# Patient Record
Sex: Female | Born: 1955
Health system: Southern US, Community
[De-identification: ages and names within clinical notes are randomized; demographics above are authoritative.]

## PROBLEM LIST (undated history)

## (undated) DIAGNOSIS — E871 Hypo-osmolality and hyponatremia: Secondary | ICD-10-CM

## (undated) DIAGNOSIS — E119 Type 2 diabetes mellitus without complications: Secondary | ICD-10-CM

## (undated) DIAGNOSIS — E782 Mixed hyperlipidemia: Secondary | ICD-10-CM

## (undated) DIAGNOSIS — I2510 Atherosclerotic heart disease of native coronary artery without angina pectoris: Secondary | ICD-10-CM

## (undated) DIAGNOSIS — I161 Hypertensive emergency: Secondary | ICD-10-CM

## (undated) DIAGNOSIS — T7840XA Allergy, unspecified, initial encounter: Secondary | ICD-10-CM

## (undated) DIAGNOSIS — I10 Essential (primary) hypertension: Secondary | ICD-10-CM

## (undated) DIAGNOSIS — I214 Non-ST elevation (NSTEMI) myocardial infarction: Secondary | ICD-10-CM

## (undated) DIAGNOSIS — Z720 Tobacco use: Secondary | ICD-10-CM

## (undated) DIAGNOSIS — E118 Type 2 diabetes mellitus with unspecified complications: Secondary | ICD-10-CM

## (undated) DIAGNOSIS — E1165 Type 2 diabetes mellitus with hyperglycemia: Secondary | ICD-10-CM

## (undated) DIAGNOSIS — F419 Anxiety disorder, unspecified: Secondary | ICD-10-CM

## (undated) DIAGNOSIS — F32A Depression, unspecified: Secondary | ICD-10-CM

## (undated) DIAGNOSIS — E876 Hypokalemia: Secondary | ICD-10-CM

## (undated) HISTORY — PX: SPINE SURGERY: SHX786

## (undated) HISTORY — DX: Mixed hyperlipidemia: E78.2

## (undated) HISTORY — DX: Essential (primary) hypertension: I10

## (undated) HISTORY — DX: Type 2 diabetes mellitus without complications: E11.9

## (undated) HISTORY — DX: Atherosclerotic heart disease of native coronary artery without angina pectoris: I25.10

## (undated) HISTORY — DX: Type 2 diabetes mellitus with hyperglycemia: E11.65

## (undated) HISTORY — DX: Non-ST elevation (NSTEMI) myocardial infarction: I21.4

## (undated) HISTORY — DX: Allergy, unspecified, initial encounter: T78.40XA

## (undated) HISTORY — DX: Hypertensive emergency: I16.1

## (undated) HISTORY — DX: Hypo-osmolality and hyponatremia: E87.1

## (undated) HISTORY — DX: Hypokalemia: E87.6

## (undated) HISTORY — DX: Type 2 diabetes mellitus with unspecified complications: E11.8

## (undated) HISTORY — DX: Depression, unspecified: F32.A

## (undated) HISTORY — PX: TONSILLECTOMY: SUR1361

## (undated) HISTORY — PX: FRACTURE SURGERY: SHX138

## (undated) HISTORY — DX: Tobacco use: Z72.0

## (undated) HISTORY — DX: Anxiety disorder, unspecified: F41.9

---

## 2000-12-28 ENCOUNTER — Emergency Department (HOSPITAL_COMMUNITY): Admission: EM | Admit: 2000-12-28 | Discharge: 2000-12-28 | Payer: Self-pay | Admitting: Emergency Medicine

## 2010-09-07 ENCOUNTER — Inpatient Hospital Stay (HOSPITAL_COMMUNITY)
Admission: EM | Admit: 2010-09-07 | Discharge: 2010-09-07 | DRG: 305 | Disposition: A | Discharge status: Home / Self Care | Payer: Self-pay | Attending: Family Medicine | Admitting: Family Medicine

## 2010-09-07 ENCOUNTER — Emergency Department (HOSPITAL_COMMUNITY): Payer: Self-pay

## 2010-09-07 DIAGNOSIS — R112 Nausea with vomiting, unspecified: Secondary | ICD-10-CM | POA: Diagnosis present

## 2010-09-07 DIAGNOSIS — Z79899 Other long term (current) drug therapy: Secondary | ICD-10-CM

## 2010-09-07 DIAGNOSIS — R0789 Other chest pain: Secondary | ICD-10-CM

## 2010-09-07 DIAGNOSIS — Z7982 Long term (current) use of aspirin: Secondary | ICD-10-CM

## 2010-09-07 DIAGNOSIS — E785 Hyperlipidemia, unspecified: Secondary | ICD-10-CM | POA: Diagnosis present

## 2010-09-07 DIAGNOSIS — F17200 Nicotine dependence, unspecified, uncomplicated: Secondary | ICD-10-CM | POA: Diagnosis present

## 2010-09-07 DIAGNOSIS — I10 Essential (primary) hypertension: Principal | ICD-10-CM | POA: Diagnosis present

## 2010-09-07 DIAGNOSIS — E119 Type 2 diabetes mellitus without complications: Secondary | ICD-10-CM | POA: Diagnosis present

## 2010-09-07 DIAGNOSIS — R110 Nausea: Secondary | ICD-10-CM

## 2010-09-07 DIAGNOSIS — E118 Type 2 diabetes mellitus with unspecified complications: Secondary | ICD-10-CM

## 2010-09-07 LAB — BASIC METABOLIC PANEL
CO2: 24 mEq/L (ref 19–32)
Calcium: 9.5 mg/dL (ref 8.4–10.5)
Chloride: 93 mEq/L — ABNORMAL LOW (ref 96–112)
Creatinine, Ser: 0.55 mg/dL (ref 0.4–1.2)
Glucose, Bld: 267 mg/dL — ABNORMAL HIGH (ref 70–99)

## 2010-09-07 LAB — CBC
Hemoglobin: 13.7 g/dL (ref 12.0–15.0)
Platelets: 216 10*3/uL (ref 150–400)
Platelets: 204 10*3/uL (ref 150–400)
RBC: 4.5 MIL/uL (ref 3.87–5.11)
WBC: 10.4 10*3/uL (ref 4.0–10.5)

## 2010-09-07 LAB — COMPREHENSIVE METABOLIC PANEL
ALT: 19 U/L (ref 0–35)
AST: 16 U/L (ref 0–37)
Albumin: 3.8 g/dL (ref 3.5–5.2)
CO2: 24 mEq/L (ref 19–32)
Chloride: 95 mEq/L — ABNORMAL LOW (ref 96–112)
Creatinine, Ser: 0.52 mg/dL (ref 0.4–1.2)
GFR calc Af Amer: >60 mL/min (ref >60)
GFR calc non Af Amer: >60 mL/min (ref >60)
Potassium: 3.8 mEq/L (ref 3.5–5.1)
Sodium: 133 mEq/L — ABNORMAL LOW (ref 135–145)
Total Bilirubin: 0.5 mg/dL (ref 0.3–1.2)

## 2010-09-07 LAB — URINE MICROSCOPIC-ADD ON

## 2010-09-07 LAB — LIPID PANEL
Cholesterol: 384 mg/dL — ABNORMAL HIGH (ref 0–200)
LDL Cholesterol: UNDETERMINED mg/dL (ref 0–99)
VLDL: UNDETERMINED mg/dL (ref 0–40)

## 2010-09-07 LAB — URINALYSIS, ROUTINE W REFLEX MICROSCOPIC
Glucose, UA: 100 mg/dL — AB
Hgb urine dipstick: NEGATIVE
Protein, ur: 100 mg/dL — AB
Specific Gravity, Urine: 1.009 (ref 1.005–1.030)
Urobilinogen, UA: 0.2 mg/dL (ref 0.0–1.0)

## 2010-09-07 LAB — DIFFERENTIAL
Basophils Absolute: 0.1 10*3/uL (ref 0.0–0.1)
Basophils Absolute: 0 10*3/uL (ref 0.0–0.1)
Basophils Relative: 1 % (ref 0–1)
Eosinophils Absolute: 0.2 10*3/uL (ref 0.0–0.7)
Eosinophils Absolute: 0.2 10*3/uL (ref 0.0–0.7)
Lymphocytes Relative: 6 % — ABNORMAL LOW (ref 12–46)
Lymphs Abs: 1 10*3/uL (ref 0.7–4.0)
Monocytes Relative: 6 % (ref 3–12)
Monocytes Relative: 5 % (ref 3–12)
Neutro Abs: 8.3 10*3/uL — ABNORMAL HIGH (ref 1.7–7.7)
Neutro Abs: 9.1 10*3/uL — ABNORMAL HIGH (ref 1.7–7.7)
Neutrophils Relative %: 87 % — ABNORMAL HIGH (ref 43–77)

## 2010-09-07 LAB — TSH: TSH: 0.643 u[IU]/mL (ref 0.350–4.500)

## 2010-09-07 LAB — POCT CARDIAC MARKERS
CKMB, poc: <1 ng/mL — ABNORMAL LOW (ref 1.0–8.0)
Myoglobin, poc: 58.4 ng/mL (ref 12–200)
Troponin i, poc: <0.05 ng/mL (ref 0.00–0.09)

## 2010-09-07 LAB — HEMOGLOBIN A1C
Hgb A1c MFr Bld: 9.2 % — ABNORMAL HIGH (ref <5.7)
Mean Plasma Glucose: 217 mg/dL — ABNORMAL HIGH (ref <117)

## 2010-10-01 NOTE — Discharge Summary (Signed)
NAME:  Rebecca [name], Rebecca [name]                ACCOUNT NO.:  [id number]  MEDICAL RECORD NO.:  [id number]           PATIENT TYPE:  I  LOCATION:  3733                         FACILITY:  [facility]  PHYSICIAN:  Pearlean Brownie, M.D.DATE OF BIRTH:  02-Oct-1955  DATE OF ADMISSION:  09/07/2010 DATE OF DISCHARGE:  09/07/2010                              DISCHARGE SUMMARY   PRIMARY CARE PHYSICIAN:  [facility].  DISCHARGE DIAGNOSES: 1. Hypertension. 2. Diabetes. 3. Hyperlipidemia. 4. Nausea.  DISCHARGE MEDICATIONS: 1. Aspirin 81 mg p.o. daily. 2. Glucotrol 5 mg p.o. daily. 3. Hydrochlorothiazide 25 mg p.o. daily. 4. Lisinopril 20 mg p.o. daily. 5. Metformin 1000 mg p.o. b.i.d. 6. Zofran 4 mg p.o. q.6 h. p.r.n. 7. Simvastatin 20 mg p.o. daily. 8. Fish oil one capsule p.o. daily.  BRIEF HOSPITAL SUMMARY:  The patient is a 55 year old female with a past medical history of hypertension, diabetes, hyperlipidemia who came in to the emergency department after 1 day of generalized malaise, nausea, vomiting, found to have an elevated blood pressure systolics into 200s and blood sugars into the 400s with no gap.  The patient has been seen previously at [facility] and diagnosed with hypertension and diabetes, but has not followed up with them since initial diagnosis. 1. Diabetes.  Blood sugar into the 300s.  The patient was given     sliding scale insulin to help bring blood sugar down.  There was no     anion gap.  BMET within normal limits exact from glucose elevation.     The patient was previously on metformin 500 b.i.d.  The patient has     not had follow up for her diabetes in the past year.  We went ahead     and increased metformin to 1000 mg p.o. b.i.d.  Also, added     Glucotrol 5 mg p.o. daily.  A1c is pending at time of discharge,     but was drawn during hospitalization.  The patient is to follow up     with [facility] tomorrow to recheck blood sugar and to     ensure  that it is coming down with p.o. agents.  The patient may     need insulin for blood sugar control, but we will first give trial     of p.o. at bedtime.  [facility] to follow. 2. Hypertension.  On admission, the patient had no signs of organ     dysfunction.  The patient states she was taking her     lisinopril/hydrochlorothiazide as directed, lisinopril 10 mg and     hydrochlorothiazide 12.5, increased the patient's blood pressure     medications to lisinopril 20 and hydrochlorothiazide 25 mg, some     improvement with blood pressure, will need to be followed at     outpatient to ensure the blood pressure continues to trend down.     We will most likely need additional agent.  The patient to follow     up tomorrow with [facility] for blood pressure recheck and     further titration or addition of  other agents as needed. 3. Chest pain.  The patient did have chest pain on presentation to the     emergency department, it was atypical in nature.  However, cardiac     enzymes are negative x2.  A1c is pending by suspect that her blood     sugar has not been in good control causing her to be at increased     risk for cardiac event.  Fasting lipid panel did show elevations in     cholesterol and triglycerides.  TSH is pending.  EKG did not show     any ST changes, but did show the patient does have a right bundle     branch block.  Did not have an EKG for comparison.  I do not think     that the patient has had a cardiac event, but does have risk     factors that need to be better controlled in order to prevent     future cardiac event.  The patient does have a positive systolic     murmur and PCP may want to consider doing an echo as an outpatient     for further evaluation. 4. Hyperlipidemia.  Lipid profile showed cholesterol 384,     triglycerides 2585.  The patient placed on statin at time of     discharge.  The patient to be followed up by PCP to ensure that     lipid  profile is improving.  We will not start fibroids at this     time.  We will defer to PCP for possible addition of this when the     patient is an outpatient setting. 5. Nausea, unsure of etiology of nausea onset x1-2 days.  No fever, no     body aches, no bloody stools, no blood in vomit, no vomiting since     time of admission.  Nausea controlled well with Zofran.  We will     discharge the patient with Zofran.  The patient to follow up     tomorrow with PCP to ensure that nausea has resolved.  PERTINENT LABORATORY VALUES:  TSH 0.643.  Lipid profile; total cholesterol 384, triglycerides 2585.  Other values unable to calculate secondary to high triglyceride level.  Cardiac markers point-of-care negative x2.  CBC; white blood cells count 10.4, hemoglobin 13.7, platelets 204.  CMP; sodium 133, potassium 3.8, chloride 95, bicarb 24, glucose 318, BUN 9, creatinine 0.52, lipase 72.  DISCHARGE FOLLOWUP:  The patient is to follow up with [facility] tomorrow at the [facility] to follow up on the above issues.  CONDITION ON DISCHARGE:  Stable.  The patient discharged on carb- modified, diabetic diet.     [name], MD   ______________________________ Pearlean Brownie, M.D.    DC/MEDQ  D:  09/07/2010  T:  09/08/2010  Job:  [id number]  Electronically Signed by [name]  on 09/23/2010 08:34:31 AM Electronically Signed by Pearlean Brownie M.D. on 10/01/2010 01:36:29 PM

## 2010-10-01 NOTE — H&P (Signed)
NAME:  Rebecca [name], WITHEM                ACCOUNT NO.:  [id number]  MEDICAL RECORD NO.:  [id number]           PATIENT TYPE:  I  LOCATION:  3733                         FACILITY:  [facility]  PHYSICIAN:  Pearlean Brownie, M.D.DATE OF BIRTH:  March 31, 1956  DATE OF ADMISSION:  09/07/2010 DATE OF DISCHARGE:  09/07/2010                             HISTORY & PHYSICAL   PRIMARY CARE PROVIDER:  [facility]/unassigned.  CHIEF COMPLAINT:  Hypertensive urgency.  HISTORY OF PRESENT ILLNESS:  This is a 55 year old female with past medical history of poorly-controlled hypertension, diabetes, hyperlipidemia here with a 1-day history of generalized malaise, nausea, vomiting, as well as intermittent chest pain, found to have elevated BP, systolic blood pressure into the 200s home as well as blood sugars into the 400s.  The patient states that she was previously seen at [facility] approximately 1-1/2 years ago for blood pressure, diabetes, and hyperlipidemia management.  The patient states that she has never had to follow up since that time apart from intermittent nonspecific complaints at [facility] up until August 2011 and has not been seen since that point.  The patient states she has been relatively asymptomatic prior to yesterday with no chest pain, shortness of breath, or generalized malaise.  BPs have been relatively well-controlled per patient at home; however, the patient reports that she does not check a blood pressure on a regular basis. The patient reports being compliant with her blood pressure medications, which include lisinopril 10 mg and hydrochlorothiazide 12.5 mg daily. The patient does report increased sodium intake, eating a lot of popcorns on a regular basis.  CBGs have also been relatively stable with metformin, which is currently at 500 mg b.i.d. dosing per the patient. The patient states that she knows yesterday her blood pressures were well into the 190s  despite lisinopril and hydrochlorothiazide use as well as blood sugars into the 400s at home despite metformin.  The patient also reports nonspecific intermittent chest pain.  The patient states this chest pain was without radiation.  No diaphoresis.  No alleviating or aggravating factors.  The patient states she does not have a history of MI, but the patient does state that she does take a baby aspirin daily.  The patient also reports intermittent polyuria and polydipsia.  ALLERGIES:  PENICILLIN.  PAST MEDICAL HISTORY: 1. Diabetes. 2. Hypertension. 3. Hyperlipidemia.  MEDICATIONS: 1. Lisinopril 10 mg p.o. daily. 2. Hydrochlorothiazide 12.5 mg p.o. daily. 3. Metformin 500 mg p.o. b.i.d. 4. Over-the-counter fish oil. 5. Aspirin 81 mg.  PAST SURGICAL HISTORY:  The patient with elbow surgery in third grade.  SOCIAL HISTORY:  The patient currently lives with significant other. 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  Smokes one pack per day.  Denies alcohol, tobacco, or drug use.  FAMILY HISTORY:  Positive for hypertension, diabetes in many members maternal and paternal family per the patient.  REVIEW OF SYSTEMS:  Negative except as noted above in HPI.  PHYSICAL EXAMINATION:  VITAL SIGNS:  Temperature 98.6, heart rate in the 70s, respirations 16 to 20, blood pressure is 182 to 209 over 70-97, saturating 95% on room air.  GENERAL:  The patient is in bed in no acute distress. HEENT:  Normocephalic, atraumatic.  Extraocular movements intact. NECK:  Large neck as stated above.  Full range of motion.  Supple. CARDIOVASCULAR:  Positive 1-2/6 systolic murmur.  Regular rate and rhythm. LUNGS:  Clear to auscultation bilaterally.  No wheezes, rales, or rhonchi. ABDOMEN:  Obese, nontender.  Positive bowel sounds. EXTREMITIES:  2+ peripheral pulses.  No edema noted. NEURO:  Alert and oriented x3.  Cranial nerves II through XII grossly intact.  Overall normal neurological function. MUSCULOSKELETAL:  5/5  strength in the right upper and lower extremity diffusely, 4/5 strength in the left upper and lower extremities diffusely (the patient states that this is her baseline).  LABS AND STUDIES: 1. BMET; sodium 131, potassium 4.7, chloride 93, CO2 of 24, BUN 9,     creatinine 0.55, glucose 267. 2. CBC; white count 10.4, hemoglobin 13.7, hematocrit 39.4 (corrected     for lipemia). 3. Calcium 9.5. 4. Lipase 72. 5. Point-of-care cardiac enzymes negative x2. 6. Chest x-ray show no acute cardiopulmonary disease. 7. EKG showing normal sinus rhythm and right bundle-branch block.  ASSESSMENT/PLAN:  This is a 55 year old female with past medical history of hypertension, hyperlipidemia, and diabetes who presents with questionable hypertensive urgency. 1. Hypertension.  This is likely secondary to poorly-controlled     hypertension.  No signs of end-organ dysfunction on labs or     physical exam.  We will start the patient on double dose of     lisinopril/hydrochlorothiazide and hydralazine for recalcitrant PTs     180s.  The patient may benefit from addition of beta-blocker at     some point.  We will check BMET in the a.m. 2. Chest pain, atypical in nature, however, given cardiovascular risk     factors, we will check cardiac enzymes as well as check a TSH,     fasting lipid panel, A1c for risk factor stratification.  We will     also check a 2-D echo given positive murmur on exam. 3. Diabetes.  We will check A1c.  I have also increased metformin to     1000 mg p.o. b.i.d.  No anion gap on presentation.  No current     signs or symptoms of DKA.  We will continue to follow. 4. Hyperlipidemia.  The patient reports history of statin     noncompliance with intermittent fish oil use.  The patient is noted     to have a marked lipemia interfering with hemoglobin reads.  We     will check a fasting lipid panel. 5. Fluid, electrolytes, and nutrition/gastrointestinal.  We will     repeat electrolytes  p.r.n., normal saline 125 an hour,     diabetic heart-healthy diet. 6. Prophylaxis.  Heparin and Protonix. 7. Disposition pending for evaluation.     Doree Albee, MD   ______________________________ Pearlean Brownie, M.D.    SN/MEDQ  D:  09/07/2010  T:  09/08/2010  Job:  [id number]  Electronically Signed by Doree Albee  on 09/19/2010 07:26:40 PM Electronically Signed by Pearlean Brownie M.D. on 10/01/2010 01:36:13 PM

## 2010-12-01 ENCOUNTER — Other Ambulatory Visit: Payer: Self-pay | Admitting: Family Medicine

## 2011-01-27 ENCOUNTER — Other Ambulatory Visit: Payer: Self-pay | Admitting: Family Medicine

## 2011-03-09 ENCOUNTER — Other Ambulatory Visit: Payer: Self-pay | Admitting: Family Medicine

## 2011-03-13 ENCOUNTER — Other Ambulatory Visit: Payer: Self-pay | Admitting: Family Medicine

## 2012-04-06 ENCOUNTER — Encounter (HOSPITAL_COMMUNITY): Payer: Self-pay | Admitting: Emergency Medicine

## 2012-04-06 ENCOUNTER — Emergency Department (HOSPITAL_COMMUNITY): Payer: Self-pay

## 2012-04-06 ENCOUNTER — Ambulatory Visit (INDEPENDENT_AMBULATORY_CARE_PROVIDER_SITE_OTHER): Payer: Self-pay | Admitting: Emergency Medicine

## 2012-04-06 ENCOUNTER — Observation Stay (HOSPITAL_COMMUNITY)
Admission: EM | Admit: 2012-04-06 | Discharge: 2012-04-08 | Disposition: A | Discharge status: Home / Self Care | Payer: Self-pay | Attending: Emergency Medicine | Admitting: Emergency Medicine

## 2012-04-06 VITALS — BP 180/100 | HR 79 | Temp 98.1°F | Resp 16 | Ht 64.0 in | Wt 166.0 lb

## 2012-04-06 DIAGNOSIS — R079 Chest pain, unspecified: Principal | ICD-10-CM | POA: Insufficient documentation

## 2012-04-06 DIAGNOSIS — Z720 Tobacco use: Secondary | ICD-10-CM | POA: Diagnosis present

## 2012-04-06 DIAGNOSIS — E118 Type 2 diabetes mellitus with unspecified complications: Secondary | ICD-10-CM | POA: Diagnosis present

## 2012-04-06 DIAGNOSIS — F17200 Nicotine dependence, unspecified, uncomplicated: Secondary | ICD-10-CM | POA: Insufficient documentation

## 2012-04-06 DIAGNOSIS — E1165 Type 2 diabetes mellitus with hyperglycemia: Secondary | ICD-10-CM

## 2012-04-06 DIAGNOSIS — E782 Mixed hyperlipidemia: Secondary | ICD-10-CM | POA: Diagnosis present

## 2012-04-06 DIAGNOSIS — E871 Hypo-osmolality and hyponatremia: Secondary | ICD-10-CM | POA: Insufficient documentation

## 2012-04-06 DIAGNOSIS — R739 Hyperglycemia, unspecified: Secondary | ICD-10-CM

## 2012-04-06 DIAGNOSIS — E119 Type 2 diabetes mellitus without complications: Secondary | ICD-10-CM | POA: Insufficient documentation

## 2012-04-06 DIAGNOSIS — I10 Essential (primary) hypertension: Secondary | ICD-10-CM | POA: Diagnosis present

## 2012-04-06 DIAGNOSIS — R0602 Shortness of breath: Secondary | ICD-10-CM | POA: Insufficient documentation

## 2012-04-06 DIAGNOSIS — IMO0002 Reserved for concepts with insufficient information to code with codable children: Secondary | ICD-10-CM

## 2012-04-06 HISTORY — DX: Tobacco use: Z72.0

## 2012-04-06 HISTORY — DX: Type 2 diabetes mellitus with hyperglycemia: E11.65

## 2012-04-06 HISTORY — DX: Reserved for concepts with insufficient information to code with codable children: IMO0002

## 2012-04-06 HISTORY — DX: Mixed hyperlipidemia: E78.2

## 2012-04-06 LAB — CBC
HCT: 37.5 % (ref 36.0–46.0)
Hemoglobin: 13 g/dL (ref 12.0–15.0)
MCH: 30.1 pg (ref 26.0–34.0)
MCHC: 34.7 g/dL (ref 30.0–36.0)
MCV: 86.8 fL (ref 78.0–100.0)
Platelets: 165 10*3/uL (ref 150–400)
RBC: 4.32 MIL/uL (ref 3.87–5.11)
RDW: 15.4 % (ref 11.5–15.5)
WBC: 8.6 10*3/uL (ref 4.0–10.5)

## 2012-04-06 LAB — BASIC METABOLIC PANEL
BUN: 6 mg/dL (ref 6–23)
CO2: 27 mEq/L (ref 19–32)
Calcium: 8.7 mg/dL (ref 8.4–10.5)
Chloride: 89 mEq/L — ABNORMAL LOW (ref 96–112)
Creatinine, Ser: 0.6 mg/dL (ref 0.50–1.10)
GFR calc Af Amer: >90 mL/min (ref >90)
GFR calc non Af Amer: >90 mL/min (ref >90)
Glucose, Bld: 245 mg/dL — ABNORMAL HIGH (ref 70–99)
Potassium: 3.7 mEq/L (ref 3.5–5.1)
Sodium: 125 mEq/L — ABNORMAL LOW (ref 135–145)

## 2012-04-06 LAB — POCT I-STAT TROPONIN I
Troponin i, poc: 0 ng/mL (ref 0.00–0.08)
Troponin i, poc: 0 ng/mL (ref 0.00–0.08)

## 2012-04-06 LAB — TROPONIN I: Troponin I: <0.3 ng/mL (ref <0.30)

## 2012-04-06 MED ORDER — SODIUM CHLORIDE 0.9 % IV BOLUS (SEPSIS)
1000.0000 mL | Freq: Once | INTRAVENOUS | Status: AC
  Administered 2012-04-06: 1000 mL via INTRAVENOUS

## 2012-04-06 MED ORDER — GLIPIZIDE 5 MG PO TABS
5.0000 mg | ORAL_TABLET | Freq: Two times a day (BID) | ORAL | Status: DC
  Filled 2012-04-06 (×4): qty 1

## 2012-04-06 MED ORDER — NITROGLYCERIN 2 % TD OINT
1.0000 [in_us] | TOPICAL_OINTMENT | Freq: Once | TRANSDERMAL | Status: AC
  Administered 2012-04-06: 1 [in_us] via TOPICAL
  Filled 2012-04-06: qty 1

## 2012-04-06 MED ORDER — METOPROLOL TARTRATE 25 MG PO TABS
50.0000 mg | ORAL_TABLET | Freq: Once | ORAL | Status: AC
  Administered 2012-04-06: 50 mg via ORAL
  Filled 2012-04-06: qty 2

## 2012-04-06 MED ORDER — SODIUM CHLORIDE 0.9 % IV BOLUS (SEPSIS)
500.0000 mL | Freq: Once | INTRAVENOUS | Status: AC
  Administered 2012-04-06: 500 mL via INTRAVENOUS

## 2012-04-06 MED ORDER — GI COCKTAIL ~~LOC~~
30.0000 mL | Freq: Once | ORAL | Status: AC
  Administered 2012-04-06: 30 mL via ORAL
  Filled 2012-04-06: qty 30

## 2012-04-06 MED ORDER — METFORMIN HCL 500 MG PO TABS
500.0000 mg | ORAL_TABLET | Freq: Two times a day (BID) | ORAL | Status: DC
  Filled 2012-04-06: qty 1

## 2012-04-06 MED ORDER — LISINOPRIL 10 MG PO TABS
10.0000 mg | ORAL_TABLET | Freq: Every day | ORAL | Status: DC
  Administered 2012-04-07 (×2): 10 mg via ORAL
  Filled 2012-04-06 (×3): qty 1

## 2012-04-06 MED ORDER — SIMVASTATIN 20 MG PO TABS
20.0000 mg | ORAL_TABLET | Freq: Every day | ORAL | Status: DC
  Administered 2012-04-06 – 2012-04-07 (×2): 20 mg via ORAL
  Filled 2012-04-06 (×4): qty 1

## 2012-04-06 MED ORDER — NITROGLYCERIN 0.3 MG SL SUBL
0.4000 mg | SUBLINGUAL_TABLET | Freq: Once | SUBLINGUAL | Status: AC
  Administered 2012-04-06: 0.4 mg via SUBLINGUAL

## 2012-04-06 NOTE — ED Notes (Signed)
[facility] called EMS to transport patient to [facility] hospital.  Patient responded to [facility] due to chest pain since Tuesday.  Patient advises it is a burning sensation however she refused nitro with EMS.  She did receive 4 baby aspirins with EMS and prior to their arrival urgent care had given her one nitro.  Patient transported here with no incidents.

## 2012-04-06 NOTE — Progress Notes (Signed)
[facility] [address] [place] [address] [phone]  Date:  04/06/2012   Name:  Rebecca [name]   DOB:  June 23, 1955   MRN:  [id number]  PCP:  No primary provider on file.    Chief Complaint: Hypertension and Chest Pain   History of Present Illness:  Rebecca [name] is a 56 y.o. very pleasant female patient who presents with the following:  Patient with type 2 diabetes and hypertension and history of 1 ppd smoking history. Has experienced chest pain that is burning in character and waxes and wanes over the last week but is never gone.  Her blood pressure has been out of control all day with a max BP 194/108.  BP has moderated a bit by the time of her visit.  Has experienced pain radiating into her teeth.  Denies shortness of breath, nausea, or diaphoresis.  All week has sensation of extrasystoles and tachyarythmia.  Denies any relationship of her pain and activity.    There is no problem list on file for this patient.   Past Medical History  Diagnosis Date  . Hypertension   . Diabetes mellitus without complication     History reviewed. No pertinent past surgical history.  History  Substance Use Topics  . Smoking status: Current Every Day Smoker -- 1.0 packs/day    Types: Cigarettes  . Smokeless tobacco: Not on file  . Alcohol Use: Not on file    Family History  Problem Relation Age of Onset  . Diabetes Brother   . Heart disease Brother   . Arthritis Brother     Allergies  Allergen Reactions  . Penicillins Rash    Medication list has been reviewed and updated.  Current Outpatient Prescriptions on File Prior to Visit  Medication Sig Dispense Refill  . glipiZIDE (GLUCOTROL) 5 MG tablet Take 5 mg by mouth 2 (two) times daily before a meal.      . hydrochlorothiazide (MICROZIDE) 12.5 MG capsule Take 12.5 mg by mouth daily.      . metFORMIN (GLUCOPHAGE) 500 MG tablet Take 500 mg by mouth 2 (two) times daily with a meal.      . simvastatin  (ZOCOR) 10 MG tablet Take 20 mg by mouth at bedtime.        Review of Systems:  As per HPI, otherwise negative.    Physical Examination: Filed Vitals:   04/06/12 1457  BP: 180/100  Pulse:   Temp:   Resp:    Filed Vitals:   04/06/12 1455  Height: 5\' 4"  (1.626 m)  Weight: 166 lb (75.297 kg)   Body mass index is 28.49 kg/(m^2). Ideal Body Weight: Weight in (lb) to have BMI = 25: 145.3   GEN: obese, NAD, Non-toxic, A & O x 3 HEENT: Atraumatic, Normocephalic. Neck supple. No masses, No LAD.  No rash or shortness of breath Ears and Nose: No external deformity. CV: RRR, No M/G/R. No JVD. No thrill. No extra heart sounds. PULM: CTA B, no wheezes, crackles, rhonchi. No retractions. No resp. distress. No accessory muscle use. ABD: S, NT, ND, +BS. No rebound. No HSM. EXTR: No c/c/e NEURO Normal gait.  PSYCH: Normally interactive. Conversant. Not depressed or anxious appearing.  Calm demeanor.    Assessment and Plan: Chest pain Uncontrolled hypertension NIDDM  Tobacco abuse TO ER  IV Currently taking ASA NTG O2  Carmelina Dane, MD

## 2012-04-06 NOTE — ED Notes (Signed)
Family at bedside. 

## 2012-04-06 NOTE — ED Provider Notes (Signed)
History    56yF with CP. Onset Tuesday. Substernal burning. Initially waxed/waned but constant since woke up this am. No SOB. No palpitations, nausea or diaphoresis. Pain does not radiate. No fever or chills. No unusual leg pain or swelling. Denies hx of blood clot. Smoker, diabetic and has HTN. No known CAD that she is aware of. Reports cath 5-6y ago and no significant blockages that she is aware of. CSN: [id number]  Arrival date & time 04/06/12  1630   First MD Initiated Contact with Patient 04/06/12 1639      Chief Complaint  Patient presents with  . Chest Pain    (Consider location/radiation/quality/duration/timing/severity/associated sxs/prior treatment) HPI  Past Medical History  Diagnosis Date  . Hypertension   . Diabetes mellitus without complication     No past surgical history on file.  Family History  Problem Relation Age of Onset  . Diabetes Brother   . Heart disease Brother   . Arthritis Brother     History  Substance Use Topics  . Smoking status: Current Every Day Smoker -- 1.0 packs/day    Types: Cigarettes  . Smokeless tobacco: Not on file  . Alcohol Use: Not on file    OB History    Grav Para Term Preterm Abortions TAB SAB Ect Mult Living                  Review of Systems   Review of symptoms negative unless otherwise noted in HPI.   Allergies  Penicillins  Home Medications   Current Outpatient Rx  Name  Route  Sig  Dispense  Refill  . BUCALFASPKGLUCCOUCHPARSUVAURJU PO TABS   Oral   Take 1 tablet by mouth daily.         Marland Kitchen GLIPIZIDE 5 MG PO TABS   Oral   Take 5 mg by mouth 2 (two) times daily before a meal.         . KRILL OIL 1000 MG PO CAPS   Oral   Take 1 capsule by mouth daily.         Marland Kitchen LISINOPRIL 10 MG PO TABS   Oral   Take 10 mg by mouth daily.         Marland Kitchen METFORMIN HCL 500 MG PO TABS   Oral   Take 500 mg by mouth 2 (two) times daily with a meal.         . SIMVASTATIN 10 MG PO TABS   Oral   Take 20 mg by  mouth at bedtime.           BP 190/78  Temp 99 F (37.2 C) (Oral)  Resp 20  SpO2 98%  Physical Exam  Nursing note and vitals reviewed. Constitutional: She appears well-developed and well-nourished. No distress.  HENT:  Head: Normocephalic and atraumatic.  Eyes: Conjunctivae normal are normal. Right eye exhibits no discharge. Left eye exhibits no discharge.  Neck: Neck supple.  Cardiovascular: Normal rate, regular rhythm and normal heart sounds.  Exam reveals no gallop and no friction rub.   No murmur heard. Pulmonary/Chest: Effort normal and breath sounds normal. No respiratory distress. She exhibits no tenderness.  Abdominal: Soft. She exhibits no distension. There is no tenderness.  Musculoskeletal: She exhibits no edema and no tenderness.       Lower extremities symmetric as compared to each other. No calf tenderness. Negative Homan's. No palpable cords.   Neurological: She is alert.  Skin: Skin is warm and dry. She is not diaphoretic.  Psychiatric: She has a normal mood and affect. Her behavior is normal. Thought content normal.    ED Course  Procedures (including critical care time)  Labs Reviewed  BASIC METABOLIC PANEL - Abnormal; Notable for the following:    Sodium 125 (*)     Chloride 89 (*)     Glucose, Bld 245 (*)     All other components within normal limits  CBC  TROPONIN I   Dg Chest 2 View  04/06/2012  *RADIOLOGY REPORT*  Clinical Data: Chest pain.  Smoker.  Hypertension.  Diabetes.  CHEST - 2 VIEW  Comparison: 09/07/2010.  Findings: Stable enlarged cardiac silhouette and prominent pulmonary vasculature and interstitial markings.  Unremarkable bones.  IMPRESSION: No acute abnormality.  Stable cardiomegaly, pulmonary vascular congestion and chronic interstitial lung disease.   Original Report Authenticated By: Beckie Salts, M.D.    EKG:  Rhythm: normal sinus Vent. rate 75 BPM PR interval 176 ms QRS duration 144 ms QT/QTc 428/478 ms RBBB ST segments:  Nonspecific ST changes. There T waves inversions anteriorly. Impression: T wave inversions noted on prior EKG from May 2012  1. Chest pain   2. Hyponatremia       MDM  56 year old female with chest pain. Atypical for ACS given burning quality and extended duration. May be GI. Doubt PE. Doubt infectious. Doubt dissection. Patient does have multiple risk factors for CAD including hypertension, smoking, and diabetes. EKG is stable from prior. Initial troponin is normal. Will place patient in the CDU under the chest pain protocol. Her BMI is under 35 and she is to get a CT of her coronaries in a.m. after she has been ruled out.        Raeford Razor, MD 04/11/12 1426

## 2012-04-06 NOTE — ED Provider Notes (Signed)
Patient in CDU under chest pain protocol.  Chest pain currently improved, however patient appears uncomfortable in bed.  She states her back is sore and she is tired of laying in bed, but when she sits up her blood pressure rises.  Patient conscious, alert, responsive.  Lungs CTA bilaterally.  S1/S2, RRR.  Abdomen soft, bowel sounds present.  Monitor reveals NSR without ectopy.  Strong distal pulses palpated all extremities.  Patient is diabetic with oral control, has been out of glipizide for several months.  She does continue to take her metformin.  Blood sugar today of 245. Cardiac markers negative.  12 lead reviewed, no indication of ischemia.  Will give fluid bolus for elevated glucose, initial dose of metoprolol for blood pressure and rate control.  Treatment and diagnostic plan discussed with patient.  Jimmye Norman, NP 04/06/12 [phone]

## 2012-04-07 ENCOUNTER — Encounter (HOSPITAL_COMMUNITY): Payer: Self-pay | Admitting: Radiology

## 2012-04-07 ENCOUNTER — Observation Stay (HOSPITAL_COMMUNITY): Payer: Self-pay

## 2012-04-07 DIAGNOSIS — R0789 Other chest pain: Secondary | ICD-10-CM

## 2012-04-07 DIAGNOSIS — E871 Hypo-osmolality and hyponatremia: Secondary | ICD-10-CM

## 2012-04-07 DIAGNOSIS — I10 Essential (primary) hypertension: Secondary | ICD-10-CM

## 2012-04-07 DIAGNOSIS — E119 Type 2 diabetes mellitus without complications: Secondary | ICD-10-CM

## 2012-04-07 LAB — POCT I-STAT TROPONIN I: Troponin i, poc: 0 ng/mL (ref 0.00–0.08)

## 2012-04-07 LAB — TSH: TSH: 0.638 u[IU]/mL (ref 0.350–4.500)

## 2012-04-07 LAB — COMPREHENSIVE METABOLIC PANEL
ALT: 18 U/L (ref 0–35)
AST: 32 U/L (ref 0–37)
CO2: 22 mEq/L (ref 19–32)
Chloride: 96 mEq/L (ref 96–112)
GFR calc non Af Amer: >90 mL/min (ref >90)
Sodium: 135 mEq/L (ref 135–145)
Total Bilirubin: 0.7 mg/dL (ref 0.3–1.2)

## 2012-04-07 LAB — BASIC METABOLIC PANEL
Chloride: 91 mEq/L — ABNORMAL LOW (ref 96–112)
GFR calc Af Amer: >90 mL/min (ref >90)
GFR calc non Af Amer: >90 mL/min (ref >90)
Potassium: 5.6 mEq/L — ABNORMAL HIGH (ref 3.5–5.1)
Sodium: 126 mEq/L — ABNORMAL LOW (ref 135–145)

## 2012-04-07 LAB — GLUCOSE, CAPILLARY
Glucose-Capillary: 308 mg/dL — ABNORMAL HIGH (ref 70–99)
Glucose-Capillary: 370 mg/dL — ABNORMAL HIGH (ref 70–99)
Glucose-Capillary: 346 mg/dL — ABNORMAL HIGH (ref 70–99)

## 2012-04-07 LAB — CBC
MCHC: 36.1 g/dL — ABNORMAL HIGH (ref 30.0–36.0)
Platelets: 221 10*3/uL (ref 150–400)
RDW: 16 % — ABNORMAL HIGH (ref 11.5–15.5)
WBC: 13.8 10*3/uL — ABNORMAL HIGH (ref 4.0–10.5)

## 2012-04-07 LAB — LIPID PANEL
Cholesterol: 415 mg/dL — ABNORMAL HIGH (ref 0–200)
Triglycerides: 2687 mg/dL — ABNORMAL HIGH (ref <150)

## 2012-04-07 LAB — MRSA PCR SCREENING: MRSA by PCR: NEGATIVE

## 2012-04-07 LAB — RAPID URINE DRUG SCREEN, HOSP PERFORMED: Amphetamines: NOT DETECTED

## 2012-04-07 LAB — LIPASE, BLOOD: Lipase: 79 U/L — ABNORMAL HIGH (ref 11–59)

## 2012-04-07 MED ORDER — SODIUM CHLORIDE 0.9 % IJ SOLN: 3.0000 mL | INTRAMUSCULAR | Status: DC | PRN

## 2012-04-07 MED ORDER — ONDANSETRON HCL 4 MG PO TABS: 4.0000 mg | ORAL_TABLET | Freq: Four times a day (QID) | ORAL | Status: DC | PRN

## 2012-04-07 MED ORDER — MORPHINE SULFATE 2 MG/ML IJ SOLN: 2.0000 mg | INTRAMUSCULAR | Status: DC | PRN

## 2012-04-07 MED ORDER — ONDANSETRON HCL 4 MG/2ML IJ SOLN
4.0000 mg | Freq: Four times a day (QID) | INTRAMUSCULAR | Status: DC | PRN
  Administered 2012-04-07 (×2): 4 mg via INTRAVENOUS
  Filled 2012-04-07 (×2): qty 2

## 2012-04-07 MED ORDER — HEPARIN SODIUM (PORCINE) 5000 UNIT/ML IJ SOLN
5000.0000 [IU] | Freq: Three times a day (TID) | INTRAMUSCULAR | Status: DC
  Administered 2012-04-07 – 2012-04-08 (×4): 5000 [IU] via SUBCUTANEOUS
  Filled 2012-04-07 (×7): qty 1

## 2012-04-07 MED ORDER — ALPRAZOLAM 0.5 MG PO TABS
0.2500 mg | ORAL_TABLET | Freq: Once | ORAL | Status: AC
  Administered 2012-04-07: 0.25 mg via ORAL
  Filled 2012-04-07: qty 1

## 2012-04-07 MED ORDER — INSULIN ASPART 100 UNIT/ML ~~LOC~~ SOLN
0.0000 [IU] | Freq: Every day | SUBCUTANEOUS | Status: DC
  Administered 2012-04-07: 5 [IU] via SUBCUTANEOUS

## 2012-04-07 MED ORDER — HYDRALAZINE HCL 20 MG/ML IJ SOLN
5.0000 mg | Freq: Once | INTRAMUSCULAR | Status: AC
  Administered 2012-04-07: 5 mg via INTRAVENOUS
  Filled 2012-04-07: qty 1

## 2012-04-07 MED ORDER — LISINOPRIL 10 MG PO TABS: 10.0000 mg | ORAL_TABLET | Freq: Once | ORAL | Status: AC

## 2012-04-07 MED ORDER — SODIUM CHLORIDE 0.9 % IJ SOLN
3.0000 mL | Freq: Two times a day (BID) | INTRAMUSCULAR | Status: DC
  Administered 2012-04-07 – 2012-04-08 (×3): 3 mL via INTRAVENOUS

## 2012-04-07 MED ORDER — PROMETHAZINE HCL 12.5 MG PO TABS: 12.5000 mg | ORAL_TABLET | Freq: Three times a day (TID) | ORAL | Status: DC | PRN

## 2012-04-07 MED ORDER — AMLODIPINE BESYLATE 10 MG PO TABS
10.0000 mg | ORAL_TABLET | Freq: Every day | ORAL | Status: DC
  Administered 2012-04-07 – 2012-04-08 (×2): 10 mg via ORAL
  Filled 2012-04-07 (×2): qty 1

## 2012-04-07 MED ORDER — PANTOPRAZOLE SODIUM 40 MG PO TBEC
40.0000 mg | DELAYED_RELEASE_TABLET | Freq: Every day | ORAL | Status: DC
  Administered 2012-04-07 – 2012-04-08 (×2): 40 mg via ORAL
  Filled 2012-04-07 (×2): qty 1

## 2012-04-07 MED ORDER — SODIUM CHLORIDE 0.9 % IV SOLN
Freq: Once | INTRAVENOUS | Status: AC
  Administered 2012-04-07: 10:00:00 via INTRAVENOUS

## 2012-04-07 MED ORDER — SENNOSIDES-DOCUSATE SODIUM 8.6-50 MG PO TABS: 1.0000 | ORAL_TABLET | Freq: Every evening | ORAL | Status: DC | PRN

## 2012-04-07 MED ORDER — ACETAMINOPHEN 325 MG PO TABS: 650.0000 mg | ORAL_TABLET | Freq: Four times a day (QID) | ORAL | Status: DC | PRN

## 2012-04-07 MED ORDER — LISINOPRIL 20 MG PO TABS
20.0000 mg | ORAL_TABLET | Freq: Every day | ORAL | Status: DC
  Administered 2012-04-08: 20 mg via ORAL
  Filled 2012-04-07: qty 1

## 2012-04-07 MED ORDER — ACETAMINOPHEN 650 MG RE SUPP: 650.0000 mg | Freq: Four times a day (QID) | RECTAL | Status: DC | PRN

## 2012-04-07 MED ORDER — PROMETHAZINE HCL 25 MG/ML IJ SOLN
12.5000 mg | Freq: Three times a day (TID) | INTRAMUSCULAR | Status: DC | PRN
  Administered 2012-04-07: 12.5 mg via INTRAVENOUS
  Filled 2012-04-07: qty 1

## 2012-04-07 MED ORDER — SODIUM CHLORIDE 0.9 % IV SOLN: 250.0000 mL | INTRAVENOUS | Status: DC | PRN

## 2012-04-07 MED ORDER — IOHEXOL 350 MG/ML SOLN
100.0000 mL | Freq: Once | INTRAVENOUS | Status: AC | PRN
  Administered 2012-04-07: 100 mL via INTRAVENOUS

## 2012-04-07 MED ORDER — INSULIN ASPART 100 UNIT/ML ~~LOC~~ SOLN
0.0000 [IU] | Freq: Three times a day (TID) | SUBCUTANEOUS | Status: DC
  Administered 2012-04-07 – 2012-04-08 (×3): 7 [IU] via SUBCUTANEOUS
  Administered 2012-04-08: 9 [IU] via SUBCUTANEOUS
  Filled 2012-04-07: qty 1

## 2012-04-07 MED ORDER — METOPROLOL TARTRATE 25 MG PO TABS
100.0000 mg | ORAL_TABLET | Freq: Once | ORAL | Status: AC
  Administered 2012-04-07: 100 mg via ORAL
  Filled 2012-04-07: qty 4

## 2012-04-07 MED ORDER — HYDRALAZINE HCL 20 MG/ML IJ SOLN
5.0000 mg | Freq: Once | INTRAMUSCULAR | Status: AC
  Administered 2012-04-07: 15:00:00 via INTRAVENOUS
  Filled 2012-04-07: qty 1

## 2012-04-07 NOTE — ED Notes (Signed)
Spoke with Felicie Morn PA about pt's BP; states that if pt's diastolic is greater than 100 to notify EDP as long as it is less than 100 cont to monitor pt

## 2012-04-07 NOTE — ED Provider Notes (Signed)
4540 am  Report given to me by Dr. Adriana Simas.  Patient continues to have chest pain on the CDU protocol.  Will call Family Practice to admit for hyponatremia, hyperglycemia, hypertension and chest pain.    Remi Haggard, NP 04/07/12 [phone]

## 2012-04-07 NOTE — H&P (Signed)
Family Medicine Teaching [facility] Admission History and Physical Service Pager: [phone]  Patient name: Milli Woolridge Medical record number: [id number] Date of birth: 01/26/1956 Age: 56 y.o. Gender: female  Primary Care Provider: Sheila Oats, MD  Chief Complaint: Chest pain  Assessment and Plan: Nealy Hickmon is a 56 y.o. year old female presenting with chest pain x5 days 1. Chest pain: At this point, patient has had 3 negative troponins so ACS is not the cause of her chest pain.  Given her persistently elevated blood pressure this would more fit with a hypertensive complication.  If so, this would likely make her diagnosis hypertensive urgency, as she does not appear to have any true signs of end organ damage. 1. Will place in obs status, stepdown bed due to continued active chest pain 2. Increase lisinopril to 20mg  PO daily 3. Single dose of hydralazine 4. Monitor on CR monitor 5. Consider cardiology consultation, although if chest pain resolves with bp control I am not sure how much they will add to her treatment 6. Will obtain A1c, FLP, TSH to help control modifiable risk factors 2. Hypertension: Management per #1 3. Diabetes 2: Hold home meds and control with SSI.  A1c for prior control (likely poor) 4. Hyponatremia: Corrects to 228.  Will recheck and monitor. 5. FEN/GI: NPO until decision about cardiology 6. Prophylaxis: SQH 7. Disposition: Stepdown, obs status 8. Code Status: full  History of Present Illness: Soleil Mas is a 56 y.o. year old female presenting with chest pain of 5 days duration.  Patient reports she began to experience non-exertional substernal chest pain 5 days ago.  She was initially not concerned about it but noticed, about three days ago, that her blood pressure was high (in the 180s systolic).  Her chest pain somewhat worsened yesterday and she noted her blood pressure was in the 200s systolic so she went to urgent care.  She was sent from their  to the ED.  In the ed she was given a GI cocktail and nitro past, the combination of which dropped her pain from a 7-8 down to a 4.  She had an EKG that showed no acute changes and three negative troponins.  Admission was requested for her persistent chest pain.  Patient says pain is relatively constant and is not associated with exertion, breathing, or coughing.  Nothing seems to make worse and the nitro paste made it somewhat better.  It is not associated with eating.  Patient reports compliance with her medications and denies significant alcohol or any illicit substance use.  She is a smoker and has been for years but has not had any cardiac problems before.  She has never seen a cardiologist per her report.  She was hospitalized in 2012 for similar symptoms that appear to have been controlled with blood pressure control.  Patient Active Problem List  Diagnosis  . Hypertension  . Chest pain  . Type II or unspecified type diabetes mellitus without mention of complication, not stated as uncontrolled  . Tobacco abuse  . Mixed hyperlipidemia   Past Medical History: Past Medical History  Diagnosis Date  . Hypertension   . Diabetes mellitus without complication    Past Surgical History: No past surgical history on file. Social History: History  Substance Use Topics  . Smoking status: Current Every Day Smoker -- 1.0 packs/day    Types: Cigarettes  . Smokeless tobacco: Not on file  . Alcohol Use: Not on file   For any additional social history  documentation, please refer to relevant sections of EMR.  Family History: Family History  Problem Relation Age of Onset  . Diabetes Brother   . Heart disease Brother   . Arthritis Brother    Allergies: Allergies  Allergen Reactions  . Penicillins Rash   Current Facility-Administered Medications on File Prior to Encounter  Medication Dose Route Frequency Provider Last Rate Last Dose  . [COMPLETED] nitroGLYCERIN (NITROSTAT) SL tablet 0.3 mg   0.3 mg Sublingual Once Phillips Odor, MD   0.4 mg at 04/06/12 1550   Current Outpatient Prescriptions on File Prior to Encounter  Medication Sig Dispense Refill  . BucAlfAspKGlucCouchParsUvaUrJu (NATURAL WATER PILLS) TABS Take 1 tablet by mouth daily.      Marland Kitchen glipiZIDE (GLUCOTROL) 5 MG tablet Take 5 mg by mouth 2 (two) times daily before a meal.      . lisinopril (PRINIVIL,ZESTRIL) 10 MG tablet Take 10 mg by mouth daily.      . metFORMIN (GLUCOPHAGE) 500 MG tablet Take 500 mg by mouth 2 (two) times daily with a meal.      . simvastatin (ZOCOR) 10 MG tablet Take 20 mg by mouth at bedtime.       Review Of Systems: Per HPI with the following additions: No visual changes, syncope, shortness of breath, diarrhea, dysuria, or leg swelling.  Has had two episodes of non-bloody non-bilious emesis in last 24 hours.  No abd pain or rashes. Otherwise 12 point review of systems was performed and was unremarkable.  Physical Exam: BP 195/97  Pulse 69  Temp 99 F (37.2 C) (Oral)  Resp 20  SpO2 93% Exam: General: NAD, sitting in bed, appropriate HEENT: MMM, EOMI, PERRL Cardiovascular: RRR, no murmurs Respiratory: Faint crackles heard bilaterally with good air movement.  No wheezes. Abdomen: SNTND Extremities: No edema, 2+ pulses Skin: No rashes Neuro: Alert and oriented x3, moves all extremities equally, 5+ strength all extremities  Labs and Imaging: CBC BMET   Lab 04/06/12 1647  WBC 8.6  HGB 13.0  HCT 37.5  PLT 165    Lab 04/06/12 1647  NA 125*  K 3.7  CL 89*  CO2 27  BUN 6  CREATININE 0.60  GLUCOSE 245*  CALCIUM 8.7    Troponins negative x3  EKG REPORT   Date: 04/07/2012  EKG Time: 11:54 AM  Rate: 73  Rhythm: normal sinus rhythm,  unchanged from previous tracings, nonspecific ST and T waves changes, RBBB  Axis: Right axis deviation  Intervals:right bundle branch block  ST&T Change: Non-specific  Narrative Interpretation: Abnormal EKG, unchanged from previous   CXR: No  acute abnormality, pulmonary vascular congestion and interstitial lung disease (both chronic)        Wynter Isaacs, MD 04/07/2012, 9:00 AM

## 2012-04-07 NOTE — H&P (Signed)
FMTS Attending Admit Note Patient seen and examined by me, discussed with resident team and I agree with their assess/plan.  Patient is a 56yoF with history of chest pain for 4 days, has been constant in mid-chest.  She downplays her dyspnea, but her partner (in the room) says the patient has indeed been having shortness of breath.  Patient reports palpitations on and off since onset.  She had an episode of emesis in the ED last night.  At present she denies nausea and is asking for something to eat.  Blood pressures continue high; she is unsure of what her baseline BPs run.   Assess/Plan: Patient with persistent chest pain who has ruled out for ACS event by serial negative Trop I's and ECG.  She did not get relief from GI cocktail; may have had slight relief from NTG.  While her lack of persistent tachycardia and absence of lower extremity exam findings do not support VTE event, may consider D-dimer +/- CTA chest in this patient with persistent central chest pain and new oxygen requirement. Would have on a PPI in the event this is GI-related. Agree with UDS.  Attempt to get better control of her blood pressure (she came in on low-dose lisinopril 10mg /day).   Paula Compton, MD

## 2012-04-07 NOTE — ED Notes (Signed)
Alert, NAD, calm, interactive, skin W&D, resps e/u, speaking in clear complete sentences, c/o "pain remains, 4/10 chest pressure", EDP & CT notified, ntg paste remains.

## 2012-04-07 NOTE — ED Provider Notes (Signed)
Medical screening examination/treatment/procedure(s) were performed by non-physician practitioner and as supervising physician I was immediately available for consultation/collaboration.  Raeford Razor, MD 04/07/12 1434

## 2012-04-08 DIAGNOSIS — E871 Hypo-osmolality and hyponatremia: Secondary | ICD-10-CM

## 2012-04-08 HISTORY — DX: Hypo-osmolality and hyponatremia: E87.1

## 2012-04-08 MED ORDER — LISINOPRIL 20 MG PO TABS: 20.0000 mg | ORAL_TABLET | Freq: Every day | ORAL | Status: DC

## 2012-04-08 MED ORDER — AMLODIPINE BESYLATE 10 MG PO TABS: 10.0000 mg | ORAL_TABLET | Freq: Every day | ORAL | Status: DC

## 2012-04-08 MED ORDER — PRAVASTATIN SODIUM 80 MG PO TABS: 80.0000 mg | ORAL_TABLET | Freq: Every day | ORAL | Status: DC

## 2012-04-08 MED ORDER — OMEPRAZOLE 20 MG PO CPDR: 20.0000 mg | DELAYED_RELEASE_CAPSULE | Freq: Every day | ORAL | Status: DC

## 2012-04-08 MED ORDER — FENOFIBRATE 48 MG PO TABS: 48.0000 mg | ORAL_TABLET | Freq: Every day | ORAL | Status: DC

## 2012-04-08 MED ORDER — ONDANSETRON HCL 4 MG PO TABS: 4.0000 mg | ORAL_TABLET | Freq: Three times a day (TID) | ORAL | Status: DC | PRN

## 2012-04-08 NOTE — Discharge Summary (Signed)
Physician Discharge Summary  Patient ID: Rebecca [name] MRN: [id number] DOB: May 26, 1955 Age: 56 y.o.  Admit date: 04/06/2012 Discharge date: 04/08/2012 Admitting Physician: Barbaraann Barthel, MD  PCP: Sheila Oats, MD  Consultants:None     Discharge Diagnosis:  Principal Problem:  *Chest pain Active Problems:  Hypertension  Type II or unspecified type diabetes mellitus without mention of complication, not stated as uncontrolled  Tobacco abuse  Mixed hyperlipidemia  Hyponatremia    Hospital Course Rebecca [name] is a 56 yo female who presented to the ED with 5 days of CP  1) Chest Pain, Atypical - Pt admitted to the hospital for ACS r/o.  She had 3 negative troponins and her EKG did not show acute changes concerning for STEMI.  She had a GI cocktail for possible etiology of GERD but this did not help.  She had a D-Dimer done to r/o VTE and possible PE which came back elevated at 0.98.  A CTA was subsequently performed and did not show PE.  She was observed overnight and did not have continued CP and had no events on telemetry.  Risk Stratification labs were done as well and showed Lipids to be elevated to TC of 415, TG of 2000+ and LDL could not be calculated. A lipase was done and was slightly elevated at 79 to help r/o pancreatitis.  Pt was stable at the time of d/c and did not have CP, this episode most likely related to GERD.  She was also started on Omeprazole 20 mg qd.   2) HTN urgency - Pt BP elevated to 220/110 at admission.  She was not having end organ damage and denied HA or diplopia.  She was increased on her lisinopril to 20 mg daily and 10mg  Norvasc, given a dose of hydralazine and metoprolol.  Her BP slowly decreased and was down to 143/67 prior to d/c.  She did not c/o acute HA, dizziness, pre-syncopal episodes, CP, SOB and was stable at d/c.  3) DM II - Pt had an A1C checked showing 8.8.  Her home medications were held and placed on SSI.  Stable during stay.    4)  Hyperlipidemia with hypertriglyceredemia  - Pt found to have elevated TC of 415, TG of 1687 and inability to calculate LDL. She was started on Pravachol 80 mg and Fenofibrate 48 mg before d/c.   5) Hyponatremia - Pt found to have sodium of 228 admission.  Upon recheck Na was 135.          Discharge PE   Filed Vitals:   04/08/12 0740  BP: 143/67  Pulse: 74  Temp:   Resp: 16   Exam:  General: NAD, sitting in bed, appropriate  HEENT: MMM, EOMI, PERRL  Cardiovascular: RRR, no murmurs appreciated  Respiratory: CTA good air movement. No wheezes.  Abdomen: SNTND  Extremities: No edema, 2+ pulses  Skin: No rashes  Neuro: Alert and oriented x3, moves all extremities equally, 5+ strength all extremities   Procedures/Imaging:  Dg Chest 2 View  04/06/2012   IMPRESSION: No acute abnormality.  Stable cardiomegaly, pulmonary vascular congestion and chronic interstitial lung disease.   Original Report Authenticated By: Beckie Salts, M.D.    Ct Angio Chest Pe W/cm &/or Wo Cm  04/07/2012 IMPRESSION:  1.  No pulmonary emboli seen. 2.  Moderate bilateral lower lobe atelectasis. 3.  Cardiomegaly and pulmonary vascular congestion. 4.  Mild mediastinal and bilateral hilar adenopathy. 5.  Probable hepatosplenomegaly. 6.  Possible bilateral thyroid masses.  This  could be better determined with elective thyroid ultrasound.   Original Report Authenticated By: Beckie Salts, M.D.     Labs  CBC  Lab 04/07/12 0921 04/06/12 1647  WBC 13.8* 8.6  HGB 15.0 13.0  HCT 41.5 37.5  PLT 221 165   BMET  Lab 04/07/12 2115 04/07/12 0921 04/06/12 1647  NA 135 126* 125*  K 4.3 5.6* 3.7  CL 96 91* 89*  CO2 22 26 27   BUN 6 5* 6  CREATININE 0.49* 0.41* 0.60  CALCIUM 9.2 9.0 8.7  PROT 7.1 -- --  BILITOT 0.7 -- --  ALKPHOS 100 -- --  ALT 18 -- --  AST 32 -- --  GLUCOSE 309* 316* 245*   TSH     Status: Normal   Collection Time   04/07/12  9:21 AM      Component Value Range Comment   TSH 0.638  0.350 - 4.500  uIU/mL   HEMOGLOBIN A1C     Status: Abnormal   Collection Time   04/07/12  9:22 AM      Component Value Range Comment   Hemoglobin A1C 8.8 (*) <5.7 %    Mean Plasma Glucose 206 (*) <117 mg/dL   LIPID PANEL     Status: Abnormal   Collection Time   04/07/12  9:26 AM      Component Value Range Comment   Cholesterol 415 (*) 0 - 200 mg/dL    Triglycerides 1610 (*) <150 mg/dL RESULT CONFIRMED BY AUTOMATED DILUTION   HDL NOT REPORTED DUE TO HIGH TRIGLYCERIDES  >39 mg/dL    Total CHOL/HDL Ratio NOT REPORTED DUE TO HIGH TRIGLYCERIDES      VLDL UNABLE TO CALCULATE IF TRIGLYCERIDE OVER 400 mg/dL  0 - 40 mg/dL    LDL Cholesterol UNABLE TO CALCULATE IF TRIGLYCERIDE OVER 400 mg/dL  0 - 99 mg/dL   GLUCOSE, CAPILLARY     Status: Abnormal   Collection Time   04/07/12 11:53 AM      Component Value Range Comment   Glucose-Capillary 346 (*) 70 - 99 mg/dL   D-DIMER, QUANTITATIVE     Status: Abnormal   Collection Time   04/07/12  1:50 PM      Component Value Range Comment   D-Dimer, Quant 0.98 (*) 0.00 - 0.48 ug/mL-FEU   URINE RAPID DRUG SCREEN (HOSP PERFORMED)     Status: Abnormal   Collection Time   04/07/12 10:03 PM      Component Value Range Comment   Opiates NONE DETECTED  NONE DETECTED    Cocaine NONE DETECTED  NONE DETECTED    Benzodiazepines NONE DETECTED  NONE DETECTED    Amphetamines NONE DETECTED  NONE DETECTED    Tetrahydrocannabinol POSITIVE (*) NONE DETECTED    Barbiturates POSITIVE (*) NONE DETECTED        Patient condition at time of discharge/disposition: stable  Disposition-home   Follow up issues: 1. Consider adjusting DM II medications.  A1C 8.8 2. Repeat Lipid Panel in 6-8 weeks and consider direct LDL if TG still > 400 3. Consider repeat UDS as positive for THC and Barbituates   Discharge follow up:   Follow-up Information    Schedule an appointment as soon as possible for a visit to follow up.   Contact information:   Please call your PCP within the next week to  schedule an appointment.  If you would like to follow up with [facility], please call our office at [phone].  Discharge Orders    Future Orders Please Complete By Expires   Diet - low sodium heart healthy      Increase activity slowly      Call MD for:  persistant nausea and vomiting      Call MD for:  difficulty breathing, headache or visual disturbances      Call MD for:  persistant dizziness or light-headedness            Discharge Instructions: Please refer to Patient Instructions section of EMR for full details.  Patient was counseled important signs and symptoms that should prompt return to medical care, changes in medications, dietary instructions, activity restrictions, and follow up appointments.  Significant instructions noted below:    Discharge Medications   Medication List     As of 04/08/2012 11:58 AM    START taking these medications         amLODipine 10 MG tablet   Commonly known as: NORVASC   Take 1 tablet (10 mg total) by mouth daily.      fenofibrate 48 MG tablet   Commonly known as: TRICOR   Take 1 tablet (48 mg total) by mouth daily.      omeprazole 20 MG capsule   Commonly known as: PRILOSEC   Take 1 capsule (20 mg total) by mouth daily.      pravastatin 80 MG tablet   Commonly known as: PRAVACHOL   Take 1 tablet (80 mg total) by mouth daily.      CHANGE how you take these medications         lisinopril 20 MG tablet   Commonly known as: PRINIVIL,ZESTRIL   Take 1 tablet (20 mg total) by mouth daily.   What changed: - medication strength - dose      CONTINUE taking these medications         glipiZIDE 5 MG tablet   Commonly known as: GLUCOTROL      KRILL OIL 1000 MG Caps      metFORMIN 500 MG tablet   Commonly known as: GLUCOPHAGE      NATURAL WATER PILLS Tabs   Generic drug: BucAlfAspKGlucCouchParsUvaUrJu      STOP taking these medications         simvastatin 10 MG tablet   Commonly known as: ZOCOR           Where to get your medications    These are the prescriptions that you need to pick up. We sent them to a specific pharmacy, so you will need to go there to get them.   [id number] [place], [place] - [address]    [address] [place] [address]    Phone: [phone]        amLODipine 10 MG tablet   fenofibrate 48 MG tablet   lisinopril 20 MG tablet   omeprazole 20 MG capsule   pravastatin 80 MG tablet                Gildardo Cranker, DO of [facility] 04/08/2012 10:36 AM

## 2012-04-09 NOTE — ED Provider Notes (Signed)
Medical screening examination/treatment/procedure(s) were conducted as a shared visit with non-physician practitioner(s) and myself.  I personally evaluated the patient during the encounter.  Persistent chest pain.  Will admit.  Donnetta Hutching, MD 04/09/12 1515

## 2012-10-01 ENCOUNTER — Other Ambulatory Visit (HOSPITAL_COMMUNITY): Payer: Self-pay | Admitting: Family Medicine

## 2012-11-13 ENCOUNTER — Inpatient Hospital Stay (HOSPITAL_COMMUNITY)
Admission: EM | Admit: 2012-11-13 | Discharge: 2012-11-16 | DRG: 247 | Disposition: A | Discharge status: Home / Self Care | Payer: MEDICAID | Attending: Internal Medicine | Admitting: Internal Medicine

## 2012-11-13 ENCOUNTER — Encounter (HOSPITAL_COMMUNITY): Payer: Self-pay | Admitting: *Deleted

## 2012-11-13 ENCOUNTER — Emergency Department (HOSPITAL_COMMUNITY): Payer: Self-pay

## 2012-11-13 DIAGNOSIS — E782 Mixed hyperlipidemia: Secondary | ICD-10-CM | POA: Diagnosis present

## 2012-11-13 DIAGNOSIS — IMO0002 Reserved for concepts with insufficient information to code with codable children: Secondary | ICD-10-CM | POA: Diagnosis present

## 2012-11-13 DIAGNOSIS — E871 Hypo-osmolality and hyponatremia: Secondary | ICD-10-CM

## 2012-11-13 DIAGNOSIS — Z955 Presence of coronary angioplasty implant and graft: Secondary | ICD-10-CM

## 2012-11-13 DIAGNOSIS — Z79899 Other long term (current) drug therapy: Secondary | ICD-10-CM

## 2012-11-13 DIAGNOSIS — E118 Type 2 diabetes mellitus with unspecified complications: Secondary | ICD-10-CM | POA: Diagnosis present

## 2012-11-13 DIAGNOSIS — F411 Generalized anxiety disorder: Secondary | ICD-10-CM | POA: Diagnosis present

## 2012-11-13 DIAGNOSIS — I214 Non-ST elevation (NSTEMI) myocardial infarction: Secondary | ICD-10-CM | POA: Diagnosis present

## 2012-11-13 DIAGNOSIS — Z720 Tobacco use: Secondary | ICD-10-CM

## 2012-11-13 DIAGNOSIS — I10 Essential (primary) hypertension: Secondary | ICD-10-CM | POA: Diagnosis present

## 2012-11-13 DIAGNOSIS — M129 Arthropathy, unspecified: Secondary | ICD-10-CM | POA: Diagnosis present

## 2012-11-13 DIAGNOSIS — E1165 Type 2 diabetes mellitus with hyperglycemia: Secondary | ICD-10-CM | POA: Diagnosis present

## 2012-11-13 DIAGNOSIS — E7800 Pure hypercholesterolemia, unspecified: Secondary | ICD-10-CM | POA: Diagnosis present

## 2012-11-13 DIAGNOSIS — I252 Old myocardial infarction: Secondary | ICD-10-CM

## 2012-11-13 DIAGNOSIS — R079 Chest pain, unspecified: Secondary | ICD-10-CM | POA: Diagnosis present

## 2012-11-13 DIAGNOSIS — Z7902 Long term (current) use of antithrombotics/antiplatelets: Secondary | ICD-10-CM

## 2012-11-13 DIAGNOSIS — I161 Hypertensive emergency: Secondary | ICD-10-CM | POA: Diagnosis present

## 2012-11-13 DIAGNOSIS — I169 Hypertensive crisis, unspecified: Secondary | ICD-10-CM

## 2012-11-13 DIAGNOSIS — I2510 Atherosclerotic heart disease of native coronary artery without angina pectoris: Secondary | ICD-10-CM | POA: Diagnosis present

## 2012-11-13 DIAGNOSIS — F17200 Nicotine dependence, unspecified, uncomplicated: Secondary | ICD-10-CM | POA: Diagnosis present

## 2012-11-13 DIAGNOSIS — Z8249 Family history of ischemic heart disease and other diseases of the circulatory system: Secondary | ICD-10-CM

## 2012-11-13 DIAGNOSIS — R739 Hyperglycemia, unspecified: Secondary | ICD-10-CM

## 2012-11-13 DIAGNOSIS — Z7982 Long term (current) use of aspirin: Secondary | ICD-10-CM

## 2012-11-13 DIAGNOSIS — E876 Hypokalemia: Secondary | ICD-10-CM | POA: Diagnosis not present

## 2012-11-13 LAB — CBC
HCT: 44.8 % (ref 36.0–46.0)
Hemoglobin: 16.4 g/dL — ABNORMAL HIGH (ref 12.0–15.0)
MCV: 87.2 fL (ref 78.0–100.0)
RBC: 5.14 MIL/uL — ABNORMAL HIGH (ref 3.87–5.11)
WBC: 8.5 10*3/uL (ref 4.0–10.5)

## 2012-11-13 LAB — POCT I-STAT TROPONIN I

## 2012-11-13 LAB — BASIC METABOLIC PANEL
BUN: 8 mg/dL (ref 6–23)
CO2: 24 mEq/L (ref 19–32)
Chloride: 96 mEq/L (ref 96–112)
Creatinine, Ser: 0.54 mg/dL (ref 0.50–1.10)

## 2012-11-13 LAB — PRO B NATRIURETIC PEPTIDE: Pro B Natriuretic peptide (BNP): 1049 pg/mL — ABNORMAL HIGH (ref 0–125)

## 2012-11-13 MED ORDER — ASPIRIN 81 MG PO CHEW
81.0000 mg | CHEWABLE_TABLET | Freq: Once | ORAL | Status: AC
  Administered 2012-11-13: 81 mg via ORAL
  Filled 2012-11-13: qty 1

## 2012-11-13 MED ORDER — MORPHINE SULFATE 4 MG/ML IJ SOLN
4.0000 mg | Freq: Once | INTRAMUSCULAR | Status: AC
  Administered 2012-11-14: 4 mg via INTRAVENOUS
  Filled 2012-11-13: qty 1

## 2012-11-13 MED ORDER — ONDANSETRON HCL 4 MG/2ML IJ SOLN
4.0000 mg | Freq: Once | INTRAMUSCULAR | Status: AC
  Administered 2012-11-14: 4 mg via INTRAVENOUS
  Filled 2012-11-13: qty 2

## 2012-11-13 MED ORDER — METOPROLOL TARTRATE 1 MG/ML IV SOLN
5.0000 mg | Freq: Once | INTRAVENOUS | Status: AC
  Administered 2012-11-13: 5 mg via INTRAVENOUS
  Filled 2012-11-13: qty 5

## 2012-11-13 MED ORDER — ENOXAPARIN SODIUM 80 MG/0.8ML ~~LOC~~ SOLN
70.0000 mg | Freq: Once | SUBCUTANEOUS | Status: AC
  Administered 2012-11-14: 70 mg via SUBCUTANEOUS
  Filled 2012-11-13: qty 0.8

## 2012-11-13 MED ORDER — CLOPIDOGREL BISULFATE 300 MG PO TABS
300.0000 mg | ORAL_TABLET | Freq: Once | ORAL | Status: AC
  Administered 2012-11-13: 300 mg via ORAL
  Filled 2012-11-13: qty 1

## 2012-11-13 MED ORDER — NITROGLYCERIN 2 % TD OINT
1.0000 [in_us] | TOPICAL_OINTMENT | Freq: Once | TRANSDERMAL | Status: AC
  Administered 2012-11-14: 1 [in_us] via TOPICAL
  Filled 2012-11-13: qty 1

## 2012-11-13 NOTE — ED Notes (Signed)
i-stat troponin 0.74 ng/mL result given to Dr. Lavella Lemons

## 2012-11-13 NOTE — ED Provider Notes (Signed)
History    CSN: [id number] Arrival date & time 11/13/12  2223  First MD Initiated Contact with Patient 11/13/12 2301     Chief Complaint  Patient presents with  . Chest Pain   (Consider location/radiation/quality/duration/timing/severity/associated sxs/prior Treatment) HPI Patient is a middle aged woman with NIDDM2, HTN and hypercholesterolemia who presents with complaints of HTN. Patient says she "had that funny feeling in my head" since Sunday and decided to check her BP this evening. At  1700: 198/103. Patient says she has been short on funds and unable to get refills of her meds so she has been trying to "stretch them out" by taking medication every other or every third day. She took the last of her medications today. Although, she notes that she ran out of glyburide > 1 month ago.   Patient reports having a "congested feeling" in her chest x 4 days. "The feeling like you have when you have a chest cold". However, the patient has not had any coughing, fever. However, she notes DOE which began today. Pt had to sit down after washing 5 dishes.  Patient describes chest discomfort h as "a burning feeling" and 5/10 in severity.   Patient is a 1ppd smoker. Patient notes a positive FH of CAD. Says her father had an MI at age 40. Her brother was in his 57s when he had first MI.   Marland Kitchen  Past Medical History  Diagnosis Date  . Hypertension   . Diabetes mellitus without complication    No past surgical history on file. Family History  Problem Relation Age of Onset  . Diabetes Brother   . Heart disease Brother   . Arthritis Brother    History  Substance Use Topics  . Smoking status: Current Every Day Smoker -- 1.00 packs/day    Types: Cigarettes  . Smokeless tobacco: Not on file  . Alcohol Use: Not on file   OB History   Grav Para Term Preterm Abortions TAB SAB Ect Mult Living                 Review of Systems Gen: no weight loss, fevers, chills, night sweats Eyes: no discharge  or drainage, no occular pain or visual changes Nose: no epistaxis or rhinorrhea Mouth: no dental pain, no sore throat Neck: no neck pain Lungs: As per history of present illness, otherwise negative CV: As per history of present illness, otherwise negative Abd: no abdominal pain, nausea, vomiting GU: no dysuria or gross hematuria MSK: no myalgias or arthralgias Neuro: no headache, no focal neurologic deficits Skin: no rash Psyche: negative.  Allergies  Penicillins  Home Medications   Current Outpatient Rx  Name  Route  Sig  Dispense  Refill  . amLODipine (NORVASC) 10 MG tablet   Oral   Take 1 tablet (10 mg total) by mouth daily.   30 tablet   3   . aspirin 81 MG chewable tablet   Oral   Chew 81 mg by mouth daily.         . BucAlfAspKGlucCouchParsUvaUrJu (NATURAL WATER PILLS) TABS   Oral   Take 1 tablet by mouth daily.         . fenofibrate (TRICOR) 48 MG tablet   Oral   Take 1 tablet (48 mg total) by mouth daily.   30 tablet   3   . KRILL OIL 1000 MG CAPS   Oral   Take 1 capsule by mouth daily.         Marland Kitchen  lisinopril (PRINIVIL,ZESTRIL) 20 MG tablet   Oral   Take 1 tablet (20 mg total) by mouth daily.   30 tablet   3   . metFORMIN (GLUCOPHAGE) 500 MG tablet   Oral   Take 500 mg by mouth 2 (two) times daily with a meal.         . pravastatin (PRAVACHOL) 80 MG tablet   Oral   Take 1 tablet (80 mg total) by mouth daily.   30 tablet   3    BP 177/85  Pulse 81  Temp(Src) 98.3 F (36.8 C) (Oral)  Resp 18  SpO2 97% Physical Exam Gen: well developed and well nourished appearing Head: NCAT Eyes: PERL, EOMI Nose: no epistaixis or rhinorrhea Mouth/throat: mucosa is moist and pink Neck: supple, no stridor Lungs: CTA B, no wheezing, rhonchi or rales Cardiovascular-regular rate and rhythm, no murmur, extra refill perfused Abd: soft, obese, notender, nondistended Back: no ttp, no cva ttp Skin: no rashese, wnl Neuro: CN ii-xii grossly intact, no  focal deficits Studies-normal to inspection, no peripheral edema Psyche; normal affect,  calm and cooperative.   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 04/02/2012, no acute ischemic changes identified.  Chest x-ray-cardiomegaly without signs of infiltrate pulmonary edema  Results for orders placed during the hospital encounter of 11/13/12 (from the past 24 hour(s))  PRO B NATRIURETIC PEPTIDE     Status: Abnormal   Collection Time    11/13/12 10:42 PM      Result Value Range   Pro B Natriuretic peptide (BNP) 1049.0 (*) 0 - 125 pg/mL  CBC     Status: Abnormal   Collection Time    11/13/12 10:43 PM      Result Value Range   WBC 8.5  4.0 - 10.5 K/uL   RBC 5.14 (*) 3.87 - 5.11 MIL/uL   Hemoglobin 16.4 (*) 12.0 - 15.0 g/dL   HCT 16.1  09.6 - 04.5 %   MCV 87.2  78.0 - 100.0 fL   MCH 31.9  26.0 - 34.0 pg   MCHC 36.6 (*) 30.0 - 36.0 g/dL   RDW 40.9  81.1 - 91.4 %   Platelets 194  150 - 400 K/uL  BASIC METABOLIC PANEL     Status: Abnormal   Collection Time    11/13/12 10:43 PM      Result Value Range   Sodium 136  135 - 145 mEq/L   Potassium 3.3 (*) 3.5 - 5.1 mEq/L   Chloride 96  96 - 112 mEq/L   CO2 24  19 - 32 mEq/L   Glucose, Bld 241 (*) 70 - 99 mg/dL   BUN 8  6 - 23 mg/dL   Creatinine, Ser 7.82  0.50 - 1.10 mg/dL   Calcium 9.8  8.4 - 95.6 mg/dL   GFR calc non Af Amer >90  >90 mL/min   GFR calc Af Amer >90  >90 mL/min  POCT I-STAT TROPONIN I     Status: Abnormal   Collection Time    11/13/12 11:12 PM      Result Value Range   Troponin i, poc 0.74 (*) 0.00 - 0.08 ng/mL   Comment NOTIFIED PHYSICIAN     Comment 3               MDM  Patient with hypertensive crisis and non-ST elevation MI along with poorly controlled diabetes and  hyper glycemia. The patient had 381 mg aspirin prior to arrival. We were giving a fourth here in the emergency  department along with 2 inches of nitro paste, Plavix 300 mg by mouth, MS, O2, Lovenox 1 mg per kilo, metoprolol IV. I have discussed the patient's case with Dr. Nadara Eaton, cardiologist on call. He recommends admission to Medicine with plan for his consultation and cardiac catheterization later in the morning. He agrees with ED management.   CRITICAL CARE Performed by: Brandt Loosen   Total critical care time: 82m  Critical care time was exclusive of separately billable procedures and treating other patients.  Critical care was necessary to treat or prevent imminent or life-threatening deterioration.  Critical care was time spent personally by me on the following activities: development of treatment plan with patient and/or surrogate as well as nursing, discussions with consultants, evaluation of patient's response to treatment, examination of patient, obtaining history from patient or surrogate, ordering and performing treatments and interventions, ordering and review of laboratory studies, ordering and review of radiographic studies, pulse oximetry and re-evaluation of patient's condition.   Case discussed with Dr. Betti Cruz who will accept the patient for admission to the SDU.  Patient is pain free at this time and BP has improved.   Brandt Loosen, MD 11/14/12 [phone]

## 2012-11-13 NOTE — ED Notes (Signed)
Pt states that to she has had burning sensation in her chest and has felt congested, nausea SOB with activity. Pain radiates to both arms which she states feels like heaviness. Pt states she is out of her medications and her BP at home was 198/103.

## 2012-11-14 ENCOUNTER — Encounter (HOSPITAL_COMMUNITY)
Admission: EM | Disposition: A | Discharge status: Home / Self Care | Payer: Self-pay | Source: Home / Self Care | Attending: Internal Medicine

## 2012-11-14 ENCOUNTER — Encounter (HOSPITAL_COMMUNITY): Payer: Self-pay | Admitting: Internal Medicine

## 2012-11-14 DIAGNOSIS — E876 Hypokalemia: Secondary | ICD-10-CM

## 2012-11-14 DIAGNOSIS — I161 Hypertensive emergency: Secondary | ICD-10-CM | POA: Diagnosis present

## 2012-11-14 DIAGNOSIS — I214 Non-ST elevation (NSTEMI) myocardial infarction: Secondary | ICD-10-CM

## 2012-11-14 HISTORY — DX: Hypokalemia: E87.6

## 2012-11-14 HISTORY — PX: LEFT HEART CATHETERIZATION WITH CORONARY ANGIOGRAM: SHX5451

## 2012-11-14 HISTORY — PX: PERCUTANEOUS CORONARY STENT INTERVENTION (PCI-S): SHX5485

## 2012-11-14 HISTORY — DX: Hypertensive emergency: I16.1

## 2012-11-14 HISTORY — DX: Non-ST elevation (NSTEMI) myocardial infarction: I21.4

## 2012-11-14 LAB — BASIC METABOLIC PANEL
BUN: 8 mg/dL (ref 6–23)
Calcium: 8.9 mg/dL (ref 8.4–10.5)
GFR calc non Af Amer: >90 mL/min (ref >90)
Glucose, Bld: 221 mg/dL — ABNORMAL HIGH (ref 70–99)
Sodium: 135 mEq/L (ref 135–145)

## 2012-11-14 LAB — CBC
MCH: 31.4 pg (ref 26.0–34.0)
MCHC: 35.8 g/dL (ref 30.0–36.0)
Platelets: 201 10*3/uL (ref 150–400)
RBC: 4.84 MIL/uL (ref 3.87–5.11)

## 2012-11-14 LAB — GLUCOSE, CAPILLARY
Glucose-Capillary: 226 mg/dL — ABNORMAL HIGH (ref 70–99)
Glucose-Capillary: 178 mg/dL — ABNORMAL HIGH (ref 70–99)

## 2012-11-14 LAB — APTT: aPTT: 40 seconds — ABNORMAL HIGH (ref 24–37)

## 2012-11-14 LAB — RAPID URINE DRUG SCREEN, HOSP PERFORMED
Amphetamines: NOT DETECTED
Barbiturates: NOT DETECTED
Benzodiazepines: NOT DETECTED
Cocaine: NOT DETECTED

## 2012-11-14 LAB — POCT ACTIVATED CLOTTING TIME: Activated Clotting Time: 488 seconds

## 2012-11-14 LAB — PROTIME-INR
INR: 0.94 (ref 0.00–1.49)
Prothrombin Time: 12.4 seconds (ref 11.6–15.2)

## 2012-11-14 LAB — LIPID PANEL
LDL Cholesterol: UNDETERMINED mg/dL (ref 0–99)
Triglycerides: 2223 mg/dL — ABNORMAL HIGH (ref <150)
VLDL: UNDETERMINED mg/dL (ref 0–40)

## 2012-11-14 LAB — CK TOTAL AND CKMB (NOT AT ARMC): Total CK: 76 U/L (ref 7–177)

## 2012-11-14 SURGERY — LEFT HEART CATHETERIZATION WITH CORONARY ANGIOGRAM
Anesthesia: LOCAL

## 2012-11-14 MED ORDER — ENOXAPARIN SODIUM 80 MG/0.8ML ~~LOC~~ SOLN
70.0000 mg | Freq: Two times a day (BID) | SUBCUTANEOUS | Status: DC
  Filled 2012-11-14 (×2): qty 0.8

## 2012-11-14 MED ORDER — MORPHINE SULFATE 2 MG/ML IJ SOLN: 1.0000 mg | INTRAMUSCULAR | Status: DC | PRN

## 2012-11-14 MED ORDER — HEPARIN SODIUM (PORCINE) 1000 UNIT/ML IJ SOLN
INTRAMUSCULAR | Status: AC
  Filled 2012-11-14: qty 1

## 2012-11-14 MED ORDER — KRILL OIL 1000 MG PO CAPS
1.0000 | ORAL_CAPSULE | Freq: Every day | ORAL | Status: DC
Start: 2012-11-14 — End: 2012-11-14

## 2012-11-14 MED ORDER — LIDOCAINE HCL (PF) 1 % IJ SOLN
INTRAMUSCULAR | Status: AC
  Filled 2012-11-14: qty 30

## 2012-11-14 MED ORDER — INSULIN ASPART 100 UNIT/ML ~~LOC~~ SOLN
0.0000 [IU] | Freq: Every day | SUBCUTANEOUS | Status: DC
  Administered 2012-11-14 – 2012-11-15 (×2): 2 [IU] via SUBCUTANEOUS

## 2012-11-14 MED ORDER — ACETAMINOPHEN 325 MG PO TABS: 650.0000 mg | ORAL_TABLET | Freq: Four times a day (QID) | ORAL | Status: DC | PRN

## 2012-11-14 MED ORDER — SODIUM CHLORIDE 0.9 % IJ SOLN: 3.0000 mL | Freq: Two times a day (BID) | INTRAMUSCULAR | Status: DC

## 2012-11-14 MED ORDER — METOPROLOL TARTRATE 25 MG PO TABS
25.0000 mg | ORAL_TABLET | Freq: Two times a day (BID) | ORAL | Status: DC
  Administered 2012-11-14: 25 mg via ORAL
  Filled 2012-11-14 (×3): qty 1

## 2012-11-14 MED ORDER — ONDANSETRON HCL 4 MG/2ML IJ SOLN: 4.0000 mg | Freq: Four times a day (QID) | INTRAMUSCULAR | Status: DC | PRN

## 2012-11-14 MED ORDER — NICOTINE 21 MG/24HR TD PT24
21.0000 mg | MEDICATED_PATCH | Freq: Every day | TRANSDERMAL | Status: DC
  Administered 2012-11-14 – 2012-11-16 (×3): 21 mg via TRANSDERMAL
  Filled 2012-11-14 (×3): qty 1

## 2012-11-14 MED ORDER — LISINOPRIL 10 MG PO TABS
10.0000 mg | ORAL_TABLET | Freq: Every day | ORAL | Status: DC
  Administered 2012-11-15 – 2012-11-16 (×2): 10 mg via ORAL
  Filled 2012-11-14 (×2): qty 1

## 2012-11-14 MED ORDER — HEPARIN (PORCINE) IN NACL 2-0.9 UNIT/ML-% IJ SOLN
INTRAMUSCULAR | Status: AC
  Filled 2012-11-14: qty 1000

## 2012-11-14 MED ORDER — POTASSIUM CHLORIDE CRYS ER 20 MEQ PO TBCR
40.0000 meq | EXTENDED_RELEASE_TABLET | Freq: Once | ORAL | Status: AC
  Administered 2012-11-14: 40 meq via ORAL
  Filled 2012-11-14: qty 2

## 2012-11-14 MED ORDER — VERAPAMIL HCL 2.5 MG/ML IV SOLN
INTRAVENOUS | Status: AC
  Filled 2012-11-14: qty 2

## 2012-11-14 MED ORDER — SODIUM CHLORIDE 0.9 % IV SOLN
1.0000 mL/kg/h | INTRAVENOUS | Status: AC
  Administered 2012-11-14: 1 mL/kg/h via INTRAVENOUS

## 2012-11-14 MED ORDER — GLYBURIDE 5 MG PO TABS
10.0000 mg | ORAL_TABLET | Freq: Every day | ORAL | Status: DC
  Administered 2012-11-15 – 2012-11-16 (×2): 10 mg via ORAL
  Filled 2012-11-14 (×3): qty 2

## 2012-11-14 MED ORDER — BIVALIRUDIN 250 MG IV SOLR
INTRAVENOUS | Status: AC
  Filled 2012-11-14: qty 250

## 2012-11-14 MED ORDER — OMEGA-3-ACID ETHYL ESTERS 1 G PO CAPS
1.0000 g | ORAL_CAPSULE | Freq: Every day | ORAL | Status: DC
  Administered 2012-11-15: 11:00:00 1 g via ORAL
  Filled 2012-11-14 (×3): qty 1

## 2012-11-14 MED ORDER — NITROGLYCERIN 0.2 MG/ML ON CALL CATH LAB
INTRAVENOUS | Status: AC
  Filled 2012-11-14: qty 1

## 2012-11-14 MED ORDER — LISINOPRIL 20 MG PO TABS
20.0000 mg | ORAL_TABLET | Freq: Every day | ORAL | Status: DC
  Filled 2012-11-14: qty 1

## 2012-11-14 MED ORDER — PRASUGREL HCL 10 MG PO TABS
10.0000 mg | ORAL_TABLET | Freq: Every day | ORAL | Status: DC
  Administered 2012-11-15 – 2012-11-16 (×2): 10 mg via ORAL
  Filled 2012-11-14 (×3): qty 1

## 2012-11-14 MED ORDER — SODIUM CHLORIDE 0.9 % IV SOLN: 250.0000 mL | INTRAVENOUS | Status: DC | PRN

## 2012-11-14 MED ORDER — ASPIRIN EC 81 MG PO TBEC
81.0000 mg | DELAYED_RELEASE_TABLET | Freq: Every day | ORAL | Status: DC
  Administered 2012-11-15 – 2012-11-16 (×2): 81 mg via ORAL
  Filled 2012-11-14 (×2): qty 1

## 2012-11-14 MED ORDER — ACETAMINOPHEN 650 MG RE SUPP: 650.0000 mg | Freq: Four times a day (QID) | RECTAL | Status: DC | PRN

## 2012-11-14 MED ORDER — SODIUM CHLORIDE 0.9 % IJ SOLN: 3.0000 mL | INTRAMUSCULAR | Status: DC | PRN

## 2012-11-14 MED ORDER — ASPIRIN 325 MG PO TABS
325.0000 mg | ORAL_TABLET | Freq: Every day | ORAL | Status: DC
  Filled 2012-11-14: qty 1

## 2012-11-14 MED ORDER — FENOFIBRATE 54 MG PO TABS
54.0000 mg | ORAL_TABLET | Freq: Every day | ORAL | Status: DC
  Filled 2012-11-14 (×2): qty 1

## 2012-11-14 MED ORDER — SODIUM CHLORIDE 0.9 % IV SOLN
INTRAVENOUS | Status: DC
  Administered 2012-11-14: 07:00:00 via INTRAVENOUS

## 2012-11-14 MED ORDER — SODIUM CHLORIDE 0.9 % IV SOLN
INTRAVENOUS | Status: DC
  Administered 2012-11-14: 03:00:00 via INTRAVENOUS

## 2012-11-14 MED ORDER — INSULIN ASPART 100 UNIT/ML ~~LOC~~ SOLN
0.0000 [IU] | Freq: Three times a day (TID) | SUBCUTANEOUS | Status: DC
  Administered 2012-11-14: 3 [IU] via SUBCUTANEOUS
  Administered 2012-11-14: 5 [IU] via SUBCUTANEOUS
  Administered 2012-11-15: 12:00:00 3 [IU] via SUBCUTANEOUS
  Administered 2012-11-15: 19:00:00 8 [IU] via SUBCUTANEOUS
  Administered 2012-11-16: 3 [IU] via SUBCUTANEOUS

## 2012-11-14 MED ORDER — SODIUM CHLORIDE 0.9 % IJ SOLN
3.0000 mL | Freq: Two times a day (BID) | INTRAMUSCULAR | Status: DC
  Administered 2012-11-14 (×2): 3 mL via INTRAVENOUS

## 2012-11-14 MED ORDER — ONDANSETRON HCL 4 MG PO TABS: 4.0000 mg | ORAL_TABLET | Freq: Four times a day (QID) | ORAL | Status: DC | PRN

## 2012-11-14 MED ORDER — METOPROLOL TARTRATE 12.5 MG HALF TABLET
12.5000 mg | ORAL_TABLET | Freq: Two times a day (BID) | ORAL | Status: DC
  Administered 2012-11-14 – 2012-11-16 (×5): 12.5 mg via ORAL
  Filled 2012-11-14 (×6): qty 1

## 2012-11-14 MED ORDER — NITROGLYCERIN 2 % TD OINT
1.0000 [in_us] | TOPICAL_OINTMENT | Freq: Three times a day (TID) | TRANSDERMAL | Status: DC
  Administered 2012-11-14 – 2012-11-16 (×7): 1 [in_us] via TOPICAL
  Filled 2012-11-14 (×2): qty 30

## 2012-11-14 MED ORDER — CLOPIDOGREL BISULFATE 75 MG PO TABS
75.0000 mg | ORAL_TABLET | Freq: Every day | ORAL | Status: DC
  Administered 2012-11-14: 75 mg via ORAL
  Filled 2012-11-14 (×2): qty 1

## 2012-11-14 MED ORDER — SIMVASTATIN 40 MG PO TABS
40.0000 mg | ORAL_TABLET | Freq: Every day | ORAL | Status: DC
  Administered 2012-11-14 – 2012-11-15 (×2): 40 mg via ORAL
  Filled 2012-11-14 (×4): qty 1

## 2012-11-14 MED ORDER — AMLODIPINE BESYLATE 10 MG PO TABS
10.0000 mg | ORAL_TABLET | Freq: Every day | ORAL | Status: DC
  Filled 2012-11-14: qty 1

## 2012-11-14 MED ORDER — PRASUGREL HCL 10 MG PO TABS
ORAL_TABLET | ORAL | Status: AC
  Filled 2012-11-14: qty 1

## 2012-11-14 MED ORDER — SODIUM CHLORIDE 0.9 % IJ SOLN
3.0000 mL | Freq: Two times a day (BID) | INTRAMUSCULAR | Status: DC
  Administered 2012-11-14: 3 mL via INTRAVENOUS

## 2012-11-14 MED ORDER — MIDAZOLAM HCL 2 MG/2ML IJ SOLN
INTRAMUSCULAR | Status: AC
  Filled 2012-11-14: qty 2

## 2012-11-14 NOTE — Progress Notes (Signed)
Utilization Review Completed. 11/14/2012

## 2012-11-14 NOTE — Interval H&P Note (Signed)
History and Physical Interval Note:  11/14/2012 8:56 AM  Rebecca [name]  has presented today for surgery, with the diagnosis of cp  The various methods of treatment have been discussed with the patient and family. After consideration of risks, benefits and other options for treatment, the patient has consented to  Procedure(s): LEFT HEART CATHETERIZATION WITH CORONARY ANGIOGRAM (N/A) and possible PCI as a surgical intervention .  The patient's history has been reviewed, patient examined, no change in status, stable for surgery.  I have reviewed the patient's chart and labs.  Questions were answered to the patient's satisfaction.     Cath Lab Visit (complete for each Cath Lab visit)  Clinical Evaluation Leading to the Procedure:   ACS: yes  Non-ACS:    Anginal Classification: CCS IV  Anti-ischemic medical therapy: Minimal Therapy (1 class of medications)  Non-Invasive Test Results: No non-invasive testing performed  Prior CABG: No previous CABG        [facility]

## 2012-11-14 NOTE — ED Notes (Signed)
CKMB 8.6

## 2012-11-14 NOTE — Progress Notes (Signed)
[facility] Progress Note [place] TEAM 1 - Stepdown/ICU TEAM   Kiandria Clum ZOX:[id number] DOB: 1956/01/10 DOA: 11/13/2012 PCP: No PCP Per Patient  Brief narrative: 57 y.o. female with history of hypertension, diabetes, and tobacco use who presented with c/o chest pain. She reported that on 11/11/2012 she developed bilateral upper extremity heaviness. She also had noted constant burning-like sensations in her chest. She denied any jaw pain or any diaphoresis. She checked her blood pressure at home and it was elevated. She had not been taking her medications consistently as she has been taking infrequently to stretch her medications due to cost issues. She took her medications on day of admission and afterwards she felt her chest pain had improved. She also had noted increasing fatigue and activity intolerance.  She presented to the emergency department for further evaluation. In the emergency department the patient was found to have a blood pressure of 198/103. Her point-of-care troponin was elevated at 0.74. Cardiology was consulted. Dr. Jacinto Halim requested hospitalist service admit the patient for further care and management. Patient was started on therapeutic Lovenox, aspirin, Plavix, IV metoprolol and nitroglycerin paste.   Assessment/Plan:  Chest pain / NSTEMI  -TNI peaked at 4.49 so far -cardiac cath this admit non obstructive LAD disease but a 99% stenosis mid Cfx so DES was placed -Cards recommends Effient and baby ASA for a minimum of one year -will continue BB after dc - decrease prior ACE I dose and D/C Norvasc -CM consulted for medication assistance esp with need for Effient which is very costly  Hypertensive emergency -better with treatment of coronary ischemia/pain -pre admit and new med changes as above  DM (diabetes mellitus), type 2, uncontrolled with complications -was unable to procure meds consistently pre admission due to finacial limitations -On Metformin pre  admit per med rec so will need to hold this medicine for 48 hours post cath -?? If was also on Glyburide (see H/P) -FU on Hgb A1c -CM consulted   Hypokalemia -resolved after repleted  Tobacco abuse -smoking cessation counseling  Mixed hyperlipidemia -TC > 300 and TG markedly elevated > 2000 -unable to obtain meds consistently -if lipids not better controlled she will likely have further progression of her CAD and develop other vascular disease such as PVD, renal dysfunction or carotid stenosis   DVT prophylaxis: Lovenox Code Status: Full Family Communication: no family present at time of exam Disposition Plan: Stepdown  Consultants: Cardiology  Procedures: Cardiac catheterization Two-vessel coronary artery disease with a subtotally occluded mid circumflex coronary artery. The proximal to midsegment of the LAD shows a heavy plaque burden and 70% stenosis. Preserved left systolic function, EF 60%. Successful PTCA and stenting of the proximal and midsegment of the circumflex coronary artery with implantation of a 2.5 x 24 mm promos DES . Will need Dual antiplatelet therapy with Effient and ASA 81 mg for at least one year. Cardiologist has discussed the medication for the patient and will try to arrange drug assistance.  Antibiotics: None  HPI/Subjective: Patient very sleepy in the recovery area but without any complaints of chest pain or shortness of breath.  Objective: Blood pressure 148/78, pulse 71, temperature 98.4 F (36.9 C), temperature source Oral, resp. rate 20, height 5\' 4"  (1.626 m), weight 69.9 kg (154 lb 1.6 oz), SpO2 92.00%.  Intake/Output Summary (Last 24 hours) at 11/14/12 1205 Last data filed at 11/14/12 0800  Gross per 24 hour  Intake    350 ml  Output  0 ml  Net    350 ml    Exam: Followup exam completed. Patient admitted today at 1:22 AM.  Scheduled Meds: Scheduled Meds: . [START ON 11/15/2012] aspirin EC  81 mg Oral Daily  . [facility] HOLD]  clopidogrel  75 mg Oral Q breakfast  . [MAR HOLD] enoxaparin (LOVENOX) injection  70 mg Subcutaneous BID  . [facility] HOLD] fenofibrate  54 mg Oral Daily  . [MAR HOLD] insulin aspart  0-15 Units Subcutaneous TID WC  . [MAR HOLD] insulin aspart  0-5 Units Subcutaneous QHS  . [START ON 11/15/2012] lisinopril  10 mg Oral Daily  . metoprolol tartrate  12.5 mg Oral BID  . [facility] HOLD] nicotine  21 mg Transdermal Daily  . [facility] HOLD] nitroGLYCERIN  1 inch Topical Q8H  . [MAR HOLD] omega-3 acid ethyl esters  1 g Oral Daily  . [facility] HOLD] simvastatin  40 mg Oral q1800  . [facility] HOLD] sodium chloride  3 mL Intravenous Q12H  . sodium chloride  3 mL Intravenous Q12H    Data Reviewed: Basic Metabolic Panel:  Recent Labs Lab 11/13/12 2243 11/14/12 0355  NA 136 135  K 3.3* 3.9  CL 96 97  CO2 24 26  GLUCOSE 241* 221*  BUN 8 8  CREATININE 0.54 0.56  CALCIUM 9.8 8.9  MG  --  1.5    Recent Labs Lab 11/14/12 0730  LIPASE 53   CBC:  Recent Labs Lab 11/13/12 2243 11/14/12 0355  WBC 8.5 10.9*  HGB 16.4* 15.2*  HCT 44.8 42.4  MCV 87.2 87.6  PLT 194 201   Cardiac Enzymes:  Recent Labs Lab 11/13/12 2242 11/14/12 0355 11/14/12 0730  CKTOTAL 76  --   --   CKMB 8.6*  --   --   TROPONINI  --  2.82* 4.49*   BNP (last 3 results)  Recent Labs  11/13/12 2242  PROBNP 1049.0*   CBG:  Recent Labs Lab 11/14/12 0236 11/14/12 0742 11/14/12 1004  GLUCAP 213* 226* 172*    Recent Results (from the past 240 hour(s))  MRSA PCR SCREENING     Status: None   Collection Time    11/14/12  2:27 AM      Result Value Range Status   MRSA by PCR NEGATIVE  NEGATIVE Final   Comment:            The GeneXpert MRSA Assay (FDA     approved for NASAL specimens     only), is one component of a     comprehensive MRSA colonization     surveillance program. It is not     intended to diagnose MRSA     infection nor to guide or     monitor treatment for     MRSA infections.     Studies:  Recent  x-ray studies have been reviewed in detail by the Attending Physician  Junious Silk, ANP [facility] Office  [phone] Pager [phone]  **If unable to reach the above provider after paging please contact the Flow Manager @ [phone]  On-Call/Text Page:      [email or web address]      password [email or web address]  If 7PM-7AM, please contact night-coverage [email or web address] Password [email or web address] 11/14/2012, 12:05 PM   LOS: 1 day   I have personally examined this patient and reviewed the entire database. I have reviewed the above note, made any necessary editorial changes, and agree with its content.  Lonia Blood, MD [facility]

## 2012-11-14 NOTE — H&P (View-Only) (Signed)
CARDIOLOGY CONSULT NOTE  Patient ID: Rebecca [name] MRN: [id number] DOB/AGE: 56/01/1956 56 y.o.  Admit date: 11/13/2012 Referring Physician  Srikar Reddy, MD Primary Physician:  No PCP Per Patient Reason for Consultation  Chest pain  HPI: Patient is a 57 Caucasian female with history of hypertension, hyperlipidemia and diabetes mellitus who has not seen a PCP in a while and has been trying to stretch her medications specifically has not taken her glipizide, and has been taking a blood pressure medications on a when necessary basis due to cost and not having a PCP. She had been doing well until 4 days ago she started having heaviness in the arms. She also had chest discomfort. The following today she felt better, however yesterday she had severe chest heaviness and was associated with shortness of breath, has made her come to the emergency room where she was admitted for further evaluation. Patient states that since admission to the hospital, her chest pain has completely subsided. She has no shortness of breath, no orthopnea, no PND. She denies any lower extremity edema or painful swelling of the lower extremities freely no recent weight changes. She does complain of cramping in her legs especially at night. No ulceration or bruise discoloration of her legs. She smokes about a pack of cigarettes a day.  Past Medical History  Diagnosis Date  . Hypertension   . Diabetes mellitus without complication      History reviewed. No pertinent past surgical history.   Family History  Problem Relation Age of Onset  . Diabetes Brother   . Heart disease Brother   . Arthritis Brother   . Diabetes Father   . Heart disease Father      Social History: History   Social History  . Marital Status: Single    Spouse Name: N/A    Number of Children: N/A  . Years of Education: N/A   Occupational History  . Not on file.   Social History Main Topics  . Smoking status: Current Every Day Smoker -- 1.00  packs/day    Types: Cigarettes  . Smokeless tobacco: Not on file  . Alcohol Use: Not on file  . Drug Use: No  . Sexually Active: Not on file   Other Topics Concern  . Not on file   Social History Narrative  . No narrative on file     Prescriptions prior to admission  Medication Sig Dispense Refill  . amLODipine (NORVASC) 10 MG tablet Take 1 tablet (10 mg total) by mouth daily.  30 tablet  3  . aspirin 81 MG chewable tablet Chew 81 mg by mouth daily.      . BucAlfAspKGlucCouchParsUvaUrJu (NATURAL WATER PILLS) TABS Take 1 tablet by mouth daily.      . fenofibrate (TRICOR) 48 MG tablet Take 1 tablet (48 mg total) by mouth daily.  30 tablet  3  . KRILL OIL 1000 MG CAPS Take 1 capsule by mouth daily.      . lisinopril (PRINIVIL,ZESTRIL) 20 MG tablet Take 1 tablet (20 mg total) by mouth daily.  30 tablet  3  . metFORMIN (GLUCOPHAGE) 500 MG tablet Take 500 mg by mouth 2 (two) times daily with a meal.      . pravastatin (PRAVACHOL) 80 MG tablet Take 1 tablet (80 mg total) by mouth daily.  30 tablet  3    Scheduled Meds: . amLODipine  10 mg Oral Daily  . aspirin  325 mg Oral Daily  . clopidogrel  75 mg   Oral Q breakfast  . enoxaparin (LOVENOX) injection  70 mg Subcutaneous BID  . fenofibrate  54 mg Oral Daily  . insulin aspart  0-15 Units Subcutaneous TID WC  . insulin aspart  0-5 Units Subcutaneous QHS  . lisinopril  20 mg Oral Daily  . metoprolol tartrate  25 mg Oral BID  . nicotine  21 mg Transdermal Daily  . nitroGLYCERIN  1 inch Topical Q8H  . omega-3 acid ethyl esters  1 g Oral Daily  . simvastatin  40 mg Oral q1800  . sodium chloride  3 mL Intravenous Q12H   Continuous Infusions: . sodium chloride 50 mL/hr at 11/14/12 0302   PRN Meds:.acetaminophen, acetaminophen, morphine injection, ondansetron (ZOFRAN) IV, ondansetron  ROS: General: no fevers/chills/night sweats Eyes: no blurry vision, diplopia, or amaurosis ENT: no sore throat or hearing loss GI: no abdominal pain,  nausea, vomiting, diarrhea, or constipation GU: no dysuria, frequency, or hematuria Skin: no rash Neuro: no headache, numbness, tingling, or weakness of extremities Musculoskeletal: no joint pain or swelling Heme: no bleeding, DVT, or easy bruising Endo: no polydipsia or polyuria   Physical Exam: Blood pressure 139/69, pulse 66, temperature 98 F (36.7 C), temperature source Oral, resp. rate 22, height 5' 4" (1.626 m), weight 69.9 kg (154 lb 1.6 oz), SpO2 95.00%.   General appearance: alert, cooperative, appears older than stated age and no distress Lungs: clear to auscultation bilaterally Chest wall: no tenderness Heart: S1 is normal, S2 is widely split but varies normally with respiration. There is no gallop or rub. Abdomen: soft, non-tender; bowel sounds normal; no masses,  no organomegaly Extremities: extremities normal, atraumatic, no cyanosis or edema Pulses: 2+ and symmetric Neurologic: Grossly normal  Labs:   Lab Results  Component Value Date   WBC 10.9* 11/14/2012   HGB 15.2* 11/14/2012   HCT 42.4 11/14/2012   MCV 87.6 11/14/2012   PLT 201 11/14/2012    Recent Labs Lab 11/14/12 0355  NA 135  K 3.9  CL 97  CO2 26  BUN 8  CREATININE 0.56  CALCIUM 8.9  GLUCOSE 221*   Lab Results  Component Value Date   CKTOTAL 76 11/13/2012   CKMB 8.6* 11/13/2012   TROPONINI 2.82* 11/14/2012    Lipid Panel     Component Value Date/Time   CHOL 336* 11/14/2012 0335   TRIG 2223* 11/14/2012 0335   HDL NOT REPORTED DUE TO HIGH TRIGLYCERIDES 11/14/2012 0335   CHOLHDL NOT REPORTED DUE TO HIGH TRIGLYCERIDES 11/14/2012 0335   VLDL UNABLE TO CALCULATE IF TRIGLYCERIDE OVER 400 mg/dL 11/14/2012 0335   LDLCALC UNABLE TO CALCULATE IF TRIGLYCERIDE OVER 400 mg/dL 11/14/2012 0335   Cardiac Panel (last 3 results)  Recent Labs  11/13/12 2242 11/14/12 0355  CKTOTAL 76  --   CKMB 8.6*  --   TROPONINI  --  2.82*  RELINDX RELATIVE INDEX IS INVALID  --     EKG: normal EKG, normal sinus rhythm,  RBBB.    Radiology: Dg Chest 2 View  11/13/2012   *RADIOLOGY REPORT*  Clinical Data: High blood pressure and chest congestion.  Chest pain.  CHEST - 2 VIEW  Comparison: 04/06/2012.  Findings: Chronic cardiomegaly.  Unchanged upper mediastinal contours.  Negative for edema, effusion, pneumothorax, or focal infiltrate. Linear opacities in the lower lungs in the lateral projection, atelectasis versus scarring.  No acute osseous abnormality.  IMPRESSION: Cardiomegaly without failure.   Original Report Authenticated By: Jonathan Watts   Scheduled Meds: . amLODipine  10 mg Oral   Daily  . aspirin  325 mg Oral Daily  . clopidogrel  75 mg Oral Q breakfast  . enoxaparin (LOVENOX) injection  70 mg Subcutaneous BID  . fenofibrate  54 mg Oral Daily  . insulin aspart  0-15 Units Subcutaneous TID WC  . insulin aspart  0-5 Units Subcutaneous QHS  . lisinopril  20 mg Oral Daily  . metoprolol tartrate  25 mg Oral BID  . nicotine  21 mg Transdermal Daily  . nitroGLYCERIN  1 inch Topical Q8H  . omega-3 acid ethyl esters  1 g Oral Daily  . simvastatin  40 mg Oral q1800  . sodium chloride  3 mL Intravenous Q12H   Continuous Infusions: . sodium chloride 50 mL/hr at 11/14/12 0302   PRN Meds:.acetaminophen, acetaminophen, morphine injection, ondansetron (ZOFRAN) IV, ondansetron    ASSESSMENT AND PLAN:  1. NSTEMI with positive cardiac markers  2. Diabetes mellitus type 2 uncontrolled 3. Chest pain and shortness of breath due to #1 and shortness of breath due to ongoing tobacco use disorder 4. Mixed  hyperlipidemia 5. Hypertension Recommendation: I had a lengthy discussion the patient regarding her cardiovascular risk factors. Also discussed with her regarding positive cardiac markers for myocardial injury. We discussed medical therapy versus coronary angiography. Patient will to proceed with coronary angiography. She understands to less than 1% risk of that, stroke, MI, and if urgent CABG but not limited to  these with cardiac catheterization and possible PCI. Patient is presently on appropriate medical therapy, discussed with the patient regarding mixed hyperlipidemia.  Mister Krahenbuhl,JAGADEESH R, MD 11/14/2012, 6:01 AM [facility] Pager: [phone] Office: [phone] If no answer Cell [phone]  

## 2012-11-14 NOTE — Progress Notes (Signed)
Pt transported to cath lab alert and oriented.  Pt belongings were carried by pt to cath lab.  Pt stated she would inform family about transport to procedural area.

## 2012-11-14 NOTE — Consult Note (Signed)
CARDIOLOGY CONSULT NOTE  Patient ID: Rebecca [name] MRN: [id number] DOB/AGE: Feb 03, 1956 57 y.o.  Admit date: 11/13/2012 Referring Physician  Andreas Blower, MD Primary Physician:  No PCP Per Patient Reason for Consultation  Chest pain  HPI: Patient is a 43 Caucasian female with history of hypertension, hyperlipidemia and diabetes mellitus who has not seen a PCP in a while and has been trying to stretch her medications specifically has not taken her glipizide, and has been taking a blood pressure medications on a when necessary basis due to cost and not having a PCP. She had been doing well until 4 days ago she started having heaviness in the arms. She also had chest discomfort. The following today she felt better, however yesterday she had severe chest heaviness and was associated with shortness of breath, has made her come to the emergency room where she was admitted for further evaluation. Patient states that since admission to the hospital, her chest pain has completely subsided. She has no shortness of breath, no orthopnea, no PND. She denies any lower extremity edema or painful swelling of the lower extremities freely no recent weight changes. She does complain of cramping in her legs especially at night. No ulceration or bruise discoloration of her legs. She smokes about a pack of cigarettes a day.  Past Medical History  Diagnosis Date  . Hypertension   . Diabetes mellitus without complication      History reviewed. No pertinent past surgical history.   Family History  Problem Relation Age of Onset  . Diabetes Brother   . Heart disease Brother   . Arthritis Brother   . Diabetes Father   . Heart disease Father      Social History: History   Social History  . Marital Status: Single    Spouse Name: N/A    Number of Children: N/A  . Years of Education: N/A   Occupational History  . Not on file.   Social History Main Topics  . Smoking status: Current Every Day Smoker -- 1.00  packs/day    Types: Cigarettes  . Smokeless tobacco: Not on file  . Alcohol Use: Not on file  . Drug Use: No  . Sexually Active: Not on file   Other Topics Concern  . Not on file   Social History Narrative  . No narrative on file     Prescriptions prior to admission  Medication Sig Dispense Refill  . amLODipine (NORVASC) 10 MG tablet Take 1 tablet (10 mg total) by mouth daily.  30 tablet  3  . aspirin 81 MG chewable tablet Chew 81 mg by mouth daily.      . BucAlfAspKGlucCouchParsUvaUrJu (NATURAL WATER PILLS) TABS Take 1 tablet by mouth daily.      . fenofibrate (TRICOR) 48 MG tablet Take 1 tablet (48 mg total) by mouth daily.  30 tablet  3  . KRILL OIL 1000 MG CAPS Take 1 capsule by mouth daily.      Marland Kitchen lisinopril (PRINIVIL,ZESTRIL) 20 MG tablet Take 1 tablet (20 mg total) by mouth daily.  30 tablet  3  . metFORMIN (GLUCOPHAGE) 500 MG tablet Take 500 mg by mouth 2 (two) times daily with a meal.      . pravastatin (PRAVACHOL) 80 MG tablet Take 1 tablet (80 mg total) by mouth daily.  30 tablet  3    Scheduled Meds: . amLODipine  10 mg Oral Daily  . aspirin  325 mg Oral Daily  . clopidogrel  75 mg  Oral Q breakfast  . enoxaparin (LOVENOX) injection  70 mg Subcutaneous BID  . fenofibrate  54 mg Oral Daily  . insulin aspart  0-15 Units Subcutaneous TID WC  . insulin aspart  0-5 Units Subcutaneous QHS  . lisinopril  20 mg Oral Daily  . metoprolol tartrate  25 mg Oral BID  . nicotine  21 mg Transdermal Daily  . nitroGLYCERIN  1 inch Topical Q8H  . omega-3 acid ethyl esters  1 g Oral Daily  . simvastatin  40 mg Oral q1800  . sodium chloride  3 mL Intravenous Q12H   Continuous Infusions: . sodium chloride 50 mL/hr at 11/14/12 0302   PRN Meds:.acetaminophen, acetaminophen, morphine injection, ondansetron (ZOFRAN) IV, ondansetron  ROS: General: no fevers/chills/night sweats Eyes: no blurry vision, diplopia, or amaurosis ENT: no sore throat or hearing loss GI: no abdominal pain,  nausea, vomiting, diarrhea, or constipation GU: no dysuria, frequency, or hematuria Skin: no rash Neuro: no headache, numbness, tingling, or weakness of extremities Musculoskeletal: no joint pain or swelling Heme: no bleeding, DVT, or easy bruising Endo: no polydipsia or polyuria   Physical Exam: Blood pressure 139/69, pulse 66, temperature 98 F (36.7 C), temperature source Oral, resp. rate 22, height 5\' 4"  (1.626 m), weight 69.9 kg (154 lb 1.6 oz), SpO2 95.00%.   General appearance: alert, cooperative, appears older than stated age and no distress Lungs: clear to auscultation bilaterally Chest wall: no tenderness Heart: S1 is normal, S2 is widely split but varies normally with respiration. There is no gallop or rub. Abdomen: soft, non-tender; bowel sounds normal; no masses,  no organomegaly Extremities: extremities normal, atraumatic, no cyanosis or edema Pulses: 2+ and symmetric Neurologic: Grossly normal  Labs:   Lab Results  Component Value Date   WBC 10.9* 11/14/2012   HGB 15.2* 11/14/2012   HCT 42.4 11/14/2012   MCV 87.6 11/14/2012   PLT 201 11/14/2012    Recent Labs Lab 11/14/12 0355  NA 135  K 3.9  CL 97  CO2 26  BUN 8  CREATININE 0.56  CALCIUM 8.9  GLUCOSE 221*   Lab Results  Component Value Date   CKTOTAL 76 11/13/2012   CKMB 8.6* 11/13/2012   TROPONINI 2.82* 11/14/2012    Lipid Panel     Component Value Date/Time   CHOL 336* 11/14/2012 0335   TRIG 2223* 11/14/2012 0335   HDL NOT REPORTED DUE TO HIGH TRIGLYCERIDES 11/14/2012 0335   CHOLHDL NOT REPORTED DUE TO HIGH TRIGLYCERIDES 11/14/2012 0335   VLDL UNABLE TO CALCULATE IF TRIGLYCERIDE OVER 400 mg/dL 0/34/7425 9563   LDLCALC UNABLE TO CALCULATE IF TRIGLYCERIDE OVER 400 mg/dL 8/75/6433 2951   Cardiac Panel (last 3 results)  Recent Labs  11/13/12 2242 11/14/12 0355  CKTOTAL 76  --   CKMB 8.6*  --   TROPONINI  --  2.82*  RELINDX RELATIVE INDEX IS INVALID  --     EKG: normal EKG, normal sinus rhythm,  RBBB.    Radiology: Dg Chest 2 View  11/13/2012   *RADIOLOGY REPORT*  Clinical Data: High blood pressure and chest congestion.  Chest pain.  CHEST - 2 VIEW  Comparison: 04/06/2012.  Findings: Chronic cardiomegaly.  Unchanged upper mediastinal contours.  Negative for edema, effusion, pneumothorax, or focal infiltrate. Linear opacities in the lower lungs in the lateral projection, atelectasis versus scarring.  No acute osseous abnormality.  IMPRESSION: Cardiomegaly without failure.   Original Report Authenticated By: Tiburcio Pea   Scheduled Meds: . amLODipine  10 mg Oral  Daily  . aspirin  325 mg Oral Daily  . clopidogrel  75 mg Oral Q breakfast  . enoxaparin (LOVENOX) injection  70 mg Subcutaneous BID  . fenofibrate  54 mg Oral Daily  . insulin aspart  0-15 Units Subcutaneous TID WC  . insulin aspart  0-5 Units Subcutaneous QHS  . lisinopril  20 mg Oral Daily  . metoprolol tartrate  25 mg Oral BID  . nicotine  21 mg Transdermal Daily  . nitroGLYCERIN  1 inch Topical Q8H  . omega-3 acid ethyl esters  1 g Oral Daily  . simvastatin  40 mg Oral q1800  . sodium chloride  3 mL Intravenous Q12H   Continuous Infusions: . sodium chloride 50 mL/hr at 11/14/12 0302   PRN Meds:.acetaminophen, acetaminophen, morphine injection, ondansetron (ZOFRAN) IV, ondansetron    ASSESSMENT AND PLAN:  1. NSTEMI with positive cardiac markers  2. Diabetes mellitus type 2 uncontrolled 3. Chest pain and shortness of breath due to #1 and shortness of breath due to ongoing tobacco use disorder 4. Mixed  hyperlipidemia 5. Hypertension Recommendation: I had a lengthy discussion the patient regarding her cardiovascular risk factors. Also discussed with her regarding positive cardiac markers for myocardial injury. We discussed medical therapy versus coronary angiography. Patient will to proceed with coronary angiography. She understands to less than 1% risk of that, stroke, MI, and if urgent CABG but not limited to  these with cardiac catheterization and possible PCI. Patient is presently on appropriate medical therapy, discussed with the patient regarding mixed hyperlipidemia.  Pamella Pert, MD 11/14/2012, 6:01 AM [facility] Pager: [phone] Office: [phone] If no answer Cell [phone]

## 2012-11-14 NOTE — Progress Notes (Signed)
CRITICAL VALUE ALERT  Critical value received:  Troponin Date of notification:  09/14/12  Time of notification:  0500  Critical value read back: yes  Nurse who received alert: Arman Bogus RN  MD notified (1st page):  K Schorr  Time of first page:  [phone]

## 2012-11-14 NOTE — H&P (Signed)
Patient's PCP: No PCP Per Patient  Chief Complaint: Chest pain and elevated blood pressure  History of Present Illness: Rebecca [name] is a 57 y.o. Caucasian female with history of hypertension, diabetes, and tobacco use who presents with the above complaints.  She reported that on Sunday, 11/11/2012 she felt both of her arms (left more than right) feeling heavy.  She also has noted constant burning-like sensation in her chest.  She denies any jaw pain or any diaphoresis.  She checked her blood pressure at home and was elevated.  She had not been taking her medications consistently as she is close to running out and has been taking infrequently to stretch her medications.  She took her medications today and she felt her chest pain improved.  She was also fatigued today and could not do any activity, as a result she presented to the emergency department for further evaluation.  She denies any recent fevers, chills, nausea, vomiting, shortness of breath, abdominal pain, diarrhea, headaches or vision changes.  In the emergency room department the patient was found to have a blood pressure of 198/103.  Her point-of-care troponin was elevated at 0.74, cardiology was consulted. Dr. Jacinto Halim, requested hospitalist admission for further care and management.  Patient was started on therapeutic Lovenox, aspirin, Plavix, IV metoprolol and nitroglycerin paste.  Currently patient is chest pain-free.  Review of Systems: All systems reviewed with the patient and positive as per history of present illness, otherwise all other systems are negative.  Past Medical History  Diagnosis Date  . Hypertension   . Diabetes mellitus without complication    History reviewed. No pertinent past surgical history. Family History  Problem Relation Age of Onset  . Diabetes Brother   . Heart disease Brother   . Arthritis Brother   . Diabetes Father   . Heart disease Father    History   Social History  . Marital Status: Single    Spouse Name: N/A    Number of Children: N/A  . Years of Education: N/A   Occupational History  . Not on file.   Social History Main Topics  . Smoking status: Current Every Day Smoker -- 1.00 packs/day    Types: Cigarettes  . Smokeless tobacco: Not on file  . Alcohol Use: Not on file  . Drug Use: No  . Sexually Active: Not on file   Other Topics Concern  . Not on file   Social History Narrative  . No narrative on file   Allergies: Penicillins  Home Meds: Prior to Admission medications   Medication Sig Start Date End Date Taking? Authorizing Provider  amLODipine (NORVASC) 10 MG tablet Take 1 tablet (10 mg total) by mouth daily. 04/08/12  Yes Twana First Hess, DO  aspirin 81 MG chewable tablet Chew 81 mg by mouth daily.   Yes Historical Provider, MD  BucAlfAspKGlucCouchParsUvaUrJu (NATURAL WATER PILLS) TABS Take 1 tablet by mouth daily.   Yes Historical Provider, MD  fenofibrate (TRICOR) 48 MG tablet Take 1 tablet (48 mg total) by mouth daily. 04/08/12  Yes Bryan R Hess, DO  KRILL OIL 1000 MG CAPS Take 1 capsule by mouth daily.   Yes Historical Provider, MD  lisinopril (PRINIVIL,ZESTRIL) 20 MG tablet Take 1 tablet (20 mg total) by mouth daily. 04/08/12  Yes Bryan R Hess, DO  metFORMIN (GLUCOPHAGE) 500 MG tablet Take 500 mg by mouth 2 (two) times daily with a meal.   Yes Historical Provider, MD  pravastatin (PRAVACHOL) 80 MG tablet Take 1  tablet (80 mg total) by mouth daily. 04/08/12  Yes Briscoe Deutscher, DO    Physical Exam: Blood pressure 177/85, pulse 81, temperature 98.3 F (36.8 C), temperature source Oral, resp. rate 18, SpO2 97.00%. General: Awake, Oriented x3, No acute distress. HEENT: EOMI, Moist mucous membranes Neck: Supple CV: S1 and S2 Lungs: Clear to ascultation bilaterally Abdomen: Soft, Nontender, Nondistended, +bowel sounds. Ext: Good pulses. Trace edema. No clubbing or cyanosis noted. Neuro: Cranial Nerves II-XII grossly intact. Has 5/5 motor strength in upper and  lower extremities.  Lab results:  Recent Labs  11/13/12 2243  NA 136  K 3.3*  CL 96  CO2 24  GLUCOSE 241*  BUN 8  CREATININE 0.54  CALCIUM 9.8   No results found for this basename: AST, ALT, ALKPHOS, BILITOT, PROT, ALBUMIN,  in the last 72 hours No results found for this basename: LIPASE, AMYLASE,  in the last 72 hours  Recent Labs  11/13/12 2243  WBC 8.5  HGB 16.4*  HCT 44.8  MCV 87.2  PLT 194    Recent Labs  11/13/12 2242  CKTOTAL 76  CKMB 8.6*   No components found with this basename: POCBNP,  No results found for this basename: DDIMER,  in the last 72 hours No results found for this basename: HGBA1C,  in the last 72 hours No results found for this basename: CHOL, HDL, LDLCALC, TRIG, CHOLHDL, LDLDIRECT,  in the last 72 hours No results found for this basename: TSH, T4TOTAL, FREET3, T3FREE, THYROIDAB,  in the last 72 hours No results found for this basename: VITAMINB12, FOLATE, FERRITIN, TIBC, IRON, RETICCTPCT,  in the last 72 hours Imaging results:  Dg Chest 2 View  11/13/2012   *RADIOLOGY REPORT*  Clinical Data: High blood pressure and chest congestion.  Chest pain.  CHEST - 2 VIEW  Comparison: 04/06/2012.  Findings: Chronic cardiomegaly.  Unchanged upper mediastinal contours.  Negative for edema, effusion, pneumothorax, or focal infiltrate. Linear opacities in the lower lungs in the lateral projection, atelectasis versus scarring.  No acute osseous abnormality.  IMPRESSION: Cardiomegaly without failure.   Original Report Authenticated By: Tiburcio Pea   Other results: EKG: Sinus rhythm with bifascicular block heart rate 82 unchanged from previous EKG.  Assessment & Plan by Problem: Chest pain/non-ST elevation MI Continue to cycle troponin x2.  Continue aspirin, Plavix, oral metoprolol, nitro paste, and oxygen for management of acute coronary syndrome. Dr. Jacinto Halim, cardiology consulted, tentative plan is cardiac catheterization in the morning.  Will request 2-D  echocardiogram in the morning.  Patient anticoagulated on therapeutic Lovenox.  Hypertensive emergency Continue home antihypertensive medications.  Started on metoprolol 25 mg twice daily.  Further titration of antihypertensive medication depending on patient's clinical course.  Hypokalemia Replace as needed.  Check magnesium in the morning.  Hyperlipidemia Continue fenofibrate and statin.  Check lipid panel in the morning to risk stratify the patient.  Type 2 diabetes uncontrolled with complications Check hemoglobin A1c in the morning.  Sliding scale insulin.  Hold metformin.  Patient was on glyburide but had run out of that over a month ago.  Tobacco abuse Patient has been counseled extensively on smoking cessation.  Nicotine patch prescribed.  Prophylaxis Therapeutic Lovenox  CODE STATUS Full code.  Disposition Admit the patient to step down as inpatient for close observation.  Time spent on admission, talking to the patient, and coordinating care was: 50 mins.  Ashton Belote A, MD 11/14/2012, 1:23 AM

## 2012-11-14 NOTE — CV Procedure (Signed)
Procedure performed:  Left heart catheterization including hemodynamic monitoring of the left ventricle, LV gram. Selective right and left coronary arteriography. PTCA and stenting of the proximal and mid circumflex coronary artery with implantation of a 2.5 x 24 mm promos drug-eluting stent.  Indication: Patient is a 57 year-old female with history of hypertension,  hyperlipidemia,  Diabetes Mellitus   who presents with NSTEMI.  Hence is brought to the cardiac catheterization lab to evaluate  coronary anatomy for definitive diagnosis of CAD.  Hemodynamic data: Left ventricular pressure was 114/11 with LVEDP of 30 mm mercury. Aortic pressure was 127/75 with a mean of 99 mm mercury. There was no pressure gradient across the aortic valve.   Left ventricle: Performed in the LAO and RAO projection revealed LVEF of 60%. There was No MR. no wall motion abnormality.  Right coronary artery: The vessel is smooth, normal, Dominant.  Left main coronary artery is large and normal.  Circumflex coronary artery: A large vessel giving a moderate size AV groove branch, the midsegment of the circumflex coronary artery shows a 20% stenosis followed by a tandem 99%/subtotally occluded vessel. TIMI 2 flow was evident.  LAD:  LAD gives origin to a large diagonal-1. Midsegment of the LAD just proximal to the large diagonal shows a 60-70% stenosis and appears to have significant amount of plaque burden.  Ramus intermediate: Is a very large caliber vessel, giving origin to secondary branches, proximal and mid segment shows a 10-20% stenosis.  Impression: Two-vessel coronary artery disease with a subtotally occluded mid circumflex coronary artery. The proximal to midsegment of the LAD shows a heavy plaque burden and 70% stenosis. Preserved left systolic function, EF 60%.  Interventional data: Successful PTCA and stenting of the proximal and midsegment of the circumflex coronary artery with implantation of a 2.5 x 24 mm  promos DES .  Will need Dual antiplatelet therapy with Effient and ASA 81 mg for at least one year. I've discussed the medication for the patient, I will try to arrange drug assistance.  Technique of diagnostic cardiac catheterization:  Under sterile precautions using a 6 French right radial  arterial access, a 6 French sheath was introduced into the right radial artery. A 5 [place] Tig 4 catheter was advanced into the ascending aorta selective  right coronary artery and left coronary artery was cannulated and angiography was performed in multiple views. The catheter was pulled back Out of the body over exchange length J-wire. Same Catheter was used to perform LV gram which was performed in LAO projection. Catheter exchanged out of the body over J-Wire. NO immediate complications noted. Patient tolerated the procedure well.   Technique of intervention:  Using a 6 [place] Ikari left 3.5 guide catheter the LM  coronary  was selected and cannulated. Using Angiomax for anticoagulation, I utilized a BMW guidewire and across the circumflex coronary artery without any difficulty. I placed the tip of the wire into the distal  coronary artery. Angiography was performed. Significant amount of wire bias in the proximal and mid to distal segment of the circumflex coronary artery was evident. The TIMI flow decreased to TIMI 0-1 flow after passage of the wire. I then immediately performed balloon angioplasty with a 2.5 and 15 mm sprinter balloon at 14 atmospheric pressure for 60 seconds.  I proceeded with implantation of a 2.5 x 24 mm promos  drug-eluting stent into the proximal and midsegment of the circumflex coronary artery. The stent was deployed at 14 atmospheric pressure for 50  Seconds. Post-angioplasty results were excellent with 0% residual stenoses and TIMI-3 flow was maintained. There was no evidence of edge dissection. The guidewire was withdrawn out of the body and the guide catheter was engaged and pulled out  of the body over the J-wire the was no immediate complication. Patient tolerated the procedure well.  Disposition: Patient will be discharged in the morning unless complications with out-patient follow up. A total of 140 cc of contrast was utilized for diagnostic and interventional procedure.

## 2012-11-15 LAB — CBC
MCH: 30.4 pg (ref 26.0–34.0)
Platelets: 160 10*3/uL (ref 150–400)
RBC: 4.18 MIL/uL (ref 3.87–5.11)
WBC: 5.9 10*3/uL (ref 4.0–10.5)

## 2012-11-15 LAB — GLUCOSE, CAPILLARY: Glucose-Capillary: 276 mg/dL — ABNORMAL HIGH (ref 70–99)

## 2012-11-15 LAB — BASIC METABOLIC PANEL
CO2: 25 mEq/L (ref 19–32)
Calcium: 8.9 mg/dL (ref 8.4–10.5)
Sodium: 138 mEq/L (ref 135–145)

## 2012-11-15 MED ORDER — FENOFIBRATE 160 MG PO TABS
160.0000 mg | ORAL_TABLET | Freq: Every day | ORAL | Status: DC
  Administered 2012-11-15: 160 mg via ORAL
  Filled 2012-11-15 (×3): qty 1

## 2012-11-15 MED FILL — Sodium Chloride IV Soln 0.9%: INTRAVENOUS | Qty: 50 | Status: AC

## 2012-11-15 NOTE — Progress Notes (Signed)
CARDIAC REHAB PHASE I   PRE:  Rate/Rhythm: 71 SR  BP:  Sitting: 138/60     SaO2: 94 RA   MODE:  Ambulation: 1000 ft   POST:  Rate/Rhythm: 75 SR  BP:  Sitting: 141/55    SaO2: 93 RA  0925-1010 Pt was sitting in the recliner upon arrival.  She stated she felt great and was looking forward to walking.  Pt ambulated 1000 ft with no assistance.  No c/o CP or SOB.  Pt stated she felt tired towards the end of her walk.  Sat patient back in recliner post walk.  Pt was very eager for the smoking cessation and she stated she wanted to quit permanently.  She was given an MI book, reviewed her stent card and booklet, educated on diet and smoking cessation, NTG use and was given exercise guidelines.  Pt voiced understanding.  Marvene Staff MS, ACSM RCEP 10:11 AM 11/15/2012

## 2012-11-15 NOTE — Clinical Documentation Improvement (Signed)
THIS DOCUMENT IS NOT A PERMANENT PART OF THE MEDICAL RECORD  11/15/12   Dear Dr. Sharon Seller,  Per H&P patient admitted with "hypertensive emergency". In the Coding world this will go to benign HTN. Please clarify term to indicate patient's severity of illness and risk of mortality. Thank you.  Possible Conditions? - HTN emergency - Accelerated HTN - Malignant HTN - Other (please specify)    Supporting Information: - Signs & Symptoms: SBP: 170's  - Treatment:  Lisinopril, Lopressor, NTG paste  You may use possible, probable, or suspect with inpatient documentation. possible, probable, suspected diagnoses MUST be documented at the time of discharge  Reviewed:  no additional documentation provided  Thank You,  Beverley Fiedler RN BSN  Clinical Documentation Specialist: [phone] Health Information Management [place]

## 2012-11-15 NOTE — Progress Notes (Signed)
[facility] Progress Note [place] TEAM 1 - Stepdown/ICU TEAM   Kennede Lusk WUJ:[id number] DOB: 07/20/1955 DOA: 11/13/2012 PCP: No PCP Per Patient  Brief narrative: 57 y.o. female with history of hypertension, diabetes, and tobacco use who presented with c/o chest pain. She reported that on 11/11/2012 she developed bilateral upper extremity heaviness. She also had noted constant burning-like sensations in her chest. She denied any jaw pain or any diaphoresis. She checked her blood pressure at home and it was elevated. She had not been taking her medications consistently as she has been taking infrequently to stretch her medications due to cost issues. She took her medications on day of admission and afterwards she felt her chest pain had improved. She also had noted increasing fatigue and activity intolerance.  She presented to the emergency department for further evaluation. In the emergency department the patient was found to have a blood pressure of 198/103. Her point-of-care troponin was elevated at 0.74. Cardiology was consulted. Dr. Jacinto Halim requested hospitalist service admit the patient for further care and management. Patient was started on therapeutic Lovenox, aspirin, Plavix, IV metoprolol and nitroglycerin paste.   Assessment/Plan:  Chest pain / NSTEMI  -TNI peaked at 4.49 so far -cardiac cath this admit non obstructive LAD disease but a 99% stenosis mid Cfx so DES was placed -Cards rec add'l 24 hours observation (7/17) -Cards recommends Effient and baby ASA for a minimum of one year -will continue BB after dc - decrease prior ACE I dose and D/C Norvasc -CM consulted for medication assistance esp with need for Effient which is very costly: 5 mg tabs $273.19 for 30 tabs and 10 mg tabs $214.75 for 30 tabs  **[facility]: Metoprolol-Lisinopril-Glyburide-Metformin-fenofibrate-simvastatin  Hypertensive emergency -better with treatment of coronary ischemia/pain -pre admit and new med  changes as above  DM (diabetes mellitus), type 2, uncontrolled with complications -was unable to procure meds consistently pre admission due to finacial limitations -On Metformin pre admit per med rec so will need to hold this medicine for 48 hours post cath -?? If was also on Glyburide (see H/P) -FU on Hgb A1c -CM consulted   Hypokalemia -resolved after repleted  Tobacco abuse -smoking cessation counseling  Mixed hyperlipidemia -TC > 300 and TG markedly elevated > 2000 -unable to obtain meds consistently -if lipids not better controlled she will likely have further progression of her CAD and develop other vascular disease such as PVD, renal dysfunction or carotid stenosis -see above re: generic meds at dc -not sure if Lovaza will be covered at [facility] and cost may exceed pt ability to pay-per info on epocrates this med is $236.87 for 120 caps   DVT prophylaxis: Lovenox Code Status: Full Family Communication: no family present at time of exam Disposition Plan: Stepdown  Consultants: Cardiology  Procedures: Cardiac catheterization Two-vessel coronary artery disease with a subtotally occluded mid circumflex coronary artery. The proximal to midsegment of the LAD shows a heavy plaque burden and 70% stenosis. Preserved left systolic function, EF 60%. Successful PTCA and stenting of the proximal and midsegment of the circumflex coronary artery with implantation of a 2.5 x 24 mm promos DES . Will need Dual antiplatelet therapy with Effient and ASA 81 mg for at least one year. Cardiologist has discussed the medication for the patient and will try to arrange drug assistance.  Antibiotics: None  HPI/Subjective: Patient very sleepy in the recovery area but without any complaints of chest pain or shortness of breath.  Objective: Blood pressure 133/65, pulse 65,  temperature 97.7 F (36.5 C), temperature source Oral, resp. rate 18, height 5\' 4"  (1.626 m), weight 70.7 kg (155 lb 13.8 oz),  SpO2 93.00%.  Intake/Output Summary (Last 24 hours) at 11/15/12 1358 Last data filed at 11/15/12 0102  Gross per 24 hour  Intake    120 ml  Output   1700 ml  Net  -1580 ml    Exam: Followup exam completed. Patient admitted today at 1:22 AM.  Scheduled Meds: Scheduled Meds: . aspirin EC  81 mg Oral Daily  . fenofibrate  160 mg Oral Daily  . glyBURIDE  10 mg Oral Q breakfast  . insulin aspart  0-15 Units Subcutaneous TID WC  . insulin aspart  0-5 Units Subcutaneous QHS  . lisinopril  10 mg Oral Daily  . metoprolol tartrate  12.5 mg Oral BID  . nicotine  21 mg Transdermal Daily  . nitroGLYCERIN  1 inch Topical Q8H  . omega-3 acid ethyl esters  1 g Oral Daily  . prasugrel  10 mg Oral Daily  . simvastatin  40 mg Oral q1800  . sodium chloride  3 mL Intravenous Q12H    Data Reviewed: Basic Metabolic Panel:  Recent Labs Lab 11/13/12 2243 11/14/12 0355 11/15/12 0635  NA 136 135 138  K 3.3* 3.9 3.8  CL 96 97 102  CO2 24 26 25   GLUCOSE 241* 221* 197*  BUN 8 8 9   CREATININE 0.54 0.56 0.55  CALCIUM 9.8 8.9 8.9  MG  --  1.5  --     Recent Labs Lab 11/14/12 0730  LIPASE 53   CBC:  Recent Labs Lab 11/13/12 2243 11/14/12 0355 11/15/12 0635  WBC 8.5 10.9* 5.9  HGB 16.4* 15.2* 12.7  HCT 44.8 42.4 37.2  MCV 87.2 87.6 89.0  PLT 194 201 160   Cardiac Enzymes:  Recent Labs Lab 11/13/12 2242 11/14/12 0355 11/14/12 0730 11/15/12 0950  CKTOTAL 76  --   --   --   CKMB 8.6*  --   --   --   TROPONINI  --  2.82* 4.49* 1.34*   BNP (last 3 results)  Recent Labs  11/13/12 2242  PROBNP 1049.0*   CBG:  Recent Labs Lab 11/14/12 1004 11/14/12 1636 11/14/12 2229 11/15/12 0814 11/15/12 1124  GLUCAP 172* 178* 250* 198* 189*    Recent Results (from the past 240 hour(s))  MRSA PCR SCREENING     Status: None   Collection Time    11/14/12  2:27 AM      Result Value Range Status   MRSA by PCR NEGATIVE  NEGATIVE Final   Comment:            The GeneXpert MRSA  Assay (FDA     approved for NASAL specimens     only), is one component of a     comprehensive MRSA colonization     surveillance program. It is not     intended to diagnose MRSA     infection nor to guide or     monitor treatment for     MRSA infections.     Studies:  Recent x-ray studies have been reviewed in detail by the Attending Physician   Patient seen and examined with NP Junious Silk , agree with her assessment and plan . Richarda Overlie MD    Junious Silk, ANP [facility] Office  [phone] Pager [phone]  **If unable to reach the above provider after paging please contact the Flow Manager @ [phone]  On-Call/Text Page:      [email or web address]      password [email or web address]  If 7PM-7AM, please contact night-coverage [email or web address] Password [email or web address] 11/15/2012, 1:58 PM   LOS: 2 days   I have personally examined this patient and reviewed the entire database. I have reviewed the above note, made any necessary editorial changes, and agree with its content.  Lonia Blood, MD [facility]

## 2012-11-15 NOTE — Progress Notes (Signed)
Subjective:  No further chest pain. Patient is presently doing well.  Objective:  Vital Signs in the last 24 hours: Temp:  [97.8 F (36.6 C)-99.2 F (37.3 C)] 97.8 F (36.6 C) (07/17 4540) Pulse Rate:  [66-79] 67 (07/17 0608) Resp:  [16-20] 18 (07/17 0608) BP: (117-136)/(60-69) 136/69 mmHg (07/17 0608) SpO2:  [92 %-96 %] 92 % (07/17 0608) Weight:  [70.7 kg (155 lb 13.8 oz)] 70.7 kg (155 lb 13.8 oz) (07/17 0031)  Intake/Output from previous day: 07/16 0701 - 07/17 0700 In: 270 [P.O.:120; I.V.:150] Out: 1700 [Urine:1700]  Physical Exam: General appearance: alert, cooperative, appears older than stated age and no distress  Lungs: clear to auscultation bilaterally  Chest wall: no tenderness  Heart: S1 is normal, S2 is widely split but varies normally with respiration. There is no gallop or rub.  Abdomen: soft, non-tender; bowel sounds normal; no masses, no organomegaly  Extremities: extremities normal, atraumatic, no cyanosis or edema  Pulses: 2+ and symmetric  Neurologic: Grossly normal  Lab Results:  Recent Labs  11/14/12 0355 11/15/12 0635  WBC 10.9* 5.9  HGB 15.2* 12.7  PLT 201 160    Recent Labs  11/14/12 0355 11/15/12 0635  NA 135 138  K 3.9 3.8  CL 97 102  CO2 26 25  GLUCOSE 221* 197*  BUN 8 9  CREATININE 0.56 0.55    Recent Labs  11/14/12 0355 11/14/12 0730  TROPONINI 2.82* 4.49*   Hepatic Function Panel No results found for this basename: PROT, ALBUMIN, AST, ALT, ALKPHOS, BILITOT, BILIDIR, IBILI,  in the last 72 hours  Recent Labs  11/14/12 0335  CHOL 336*   No results found for this basename: PROTIME,  in the last 72 hours Lipid Panel     Component Value Date/Time   CHOL 336* 11/14/2012 0335   TRIG 2223* 11/14/2012 0335   HDL NOT REPORTED DUE TO HIGH TRIGLYCERIDES 11/14/2012 0335   CHOLHDL NOT REPORTED DUE TO HIGH TRIGLYCERIDES 11/14/2012 0335   VLDL UNABLE TO CALCULATE IF TRIGLYCERIDE OVER 400 mg/dL 9/81/1914 7829   LDLCALC UNABLE TO  CALCULATE IF TRIGLYCERIDE OVER 400 mg/dL 5/62/1308 6578   Cardiac Studies:  EKG: NSR, RBBB.  Assessment/Plan:  1. Non-ST elevation myocardial infarction S/P PTCA and stenting of the proximal and mid circumflex coronary artery with implantation of a 2.5 x 24 mm promos drug-eluting stent on 11/14/2012.  2. Hypertension 3. Diabetes mellitus type 2 uncontrolled 4. Mixed hyperlipidemia 5. Continued tobacco use disorder.  Recommendation: Patient presently asymptomatic, however has multiple cardiovascular risk factors that are still uncontrolled and also has medication issues. I would recommend she be observed for 24 hours more. I repeated her cardiac markers to see if they're trending down. She has at least a 70% stenosis in the proximal LAD, she needs aggressive risk modification and I be happy to continue to follow her in the outpatient basis he   Pamella Pert, M.D. 11/15/2012, 9:21 AM [facility] Pager: [phone] Office: [phone] If no answer: [phone]

## 2012-11-15 NOTE — Care Management Note (Addendum)
    Page 1 of 2   11/16/2012     2:02:04 PM   CARE MANAGEMENT NOTE 11/16/2012  Patient:  CARLISSA, PESOLA   Account Number:  [id number]  Date Initiated:  11/15/2012  Documentation initiated by:  [name]  Subjective/Objective Assessment:   57 yo admitted with CP///hm with partner/friend     Action/Plan:   heart cath///home with Effient/ medication assistance   Anticipated DC Date:  11/16/2012   Anticipated DC Plan:  HOME/SELF CARE  In-house referral  Financial Counselor      DC Planning Services  CM consult  Medication Assistance      Choice offered to / List presented to:             Status of service:   Medicare Important Message given?   (If response is "NO", the following Medicare IM given date fields will be blank) Date Medicare IM given:   Date Additional Medicare IM given:    Discharge Disposition:    Per UR Regulation:    If discussed at Long Length of Stay Meetings, dates discussed:    Comments:  11/16/12... Email and call in to [facility] to set up PCP. [name], RN, BSN, [place] [phone].  11/16/12... Pt agrees to go to [employer] for meds as [facility] program has similar co-pays ($3 for 34 days). Camellia J. Lucretia Roers, RN, BSN, [place] [phone].  11/15/12 @ 1030.Marland KitchenMarland KitchenOletta Cohn, RN, BSN, [employer] [phone] Spoke with pt at bedside regarding discharge planning.  Pt prescribed Effient 10mg  QD.  NCM presented pt with Effient brochure with 30 day free card.  NCM followed up with Maddie, Financial Counselor to ensure pt would be seen. NCM also presented pt with list of PCP to choose from.  NCM to follow up prior to discharge.

## 2012-11-16 LAB — GLUCOSE, CAPILLARY

## 2012-11-16 MED ORDER — GLYBURIDE 5 MG PO TABS: 10.0000 mg | ORAL_TABLET | Freq: Every day | ORAL | Status: DC

## 2012-11-16 MED ORDER — LISINOPRIL 20 MG PO TABS: 10.0000 mg | ORAL_TABLET | Freq: Every day | ORAL | Status: DC

## 2012-11-16 MED ORDER — METOPROLOL TARTRATE 12.5 MG HALF TABLET: 25.0000 mg | ORAL_TABLET | Freq: Two times a day (BID) | ORAL | Status: DC

## 2012-11-16 MED ORDER — SIMVASTATIN 40 MG PO TABS: 40.0000 mg | ORAL_TABLET | Freq: Every day | ORAL | Status: DC

## 2012-11-16 MED ORDER — FENOFIBRATE 160 MG PO TABS: 160.0000 mg | ORAL_TABLET | Freq: Every day | ORAL | Status: DC

## 2012-11-16 MED ORDER — OMEGA-3-ACID ETHYL ESTERS 1 G PO CAPS: 1.0000 g | ORAL_CAPSULE | Freq: Every day | ORAL | Status: DC

## 2012-11-16 MED ORDER — PRASUGREL HCL 10 MG PO TABS: 10.0000 mg | ORAL_TABLET | Freq: Every day | ORAL | Status: DC

## 2012-11-16 MED ORDER — HYDROXYZINE PAMOATE 25 MG PO CAPS: 25.0000 mg | ORAL_CAPSULE | Freq: Three times a day (TID) | ORAL | Status: DC | PRN

## 2012-11-16 MED ORDER — METOPROLOL TARTRATE 25 MG PO TABS: 25.0000 mg | ORAL_TABLET | Freq: Two times a day (BID) | ORAL | Status: DC

## 2012-11-16 NOTE — Progress Notes (Signed)
CARDIAC REHAB PHASE I   PRE:  Rate/Rhythm: 72 SR  BP:  Sitting: 116/61     SaO2: 96 RA  MODE:  Ambulation: 1000 ft   POST:  Rate/Rhythm: 77 SR  BP:  Sitting: 131/55    SaO2: 94 RA  [id number] Patient walked 1000 ft with no c/o and no assist or walking device.  Tolerated exercise very well.  Really eager for smoking cessation.  Marvene Staff MS, ACSM RCEP 9:26 AM 11/16/2012

## 2012-11-16 NOTE — Discharge Summary (Signed)
Physician Discharge Summary  Rebecca [name] BJY:[id number] DOB: [birth year] DOA: 11/13/2012  PCP: No PCP Per Patient  Admit date: 11/13/2012 Discharge date: 11/16/2012  Time spent: 30 minutes  Recommendations for Outpatient Follow-up:  1. Keep scheduled appointment with cardiologist/Dr. Jacinto Halim 2. Establish with a PCP. Use list given to you by case manager. If eligible please contact the [facility] to establish as a patient.  Discharge Diagnoses:  Active Problems:   Chest pain/ NSTEMI (non-ST elevated myocardial infarction) due to 2 vessel CAD-s/p DES to mid circumflex   Hypertensive emergency-resolved   DM (diabetes mellitus), type 2, uncontrolled with complications   Hypokalemia   Tobacco abuse   Mixed hyperlipidemia   Discharge Condition: stable  Diet recommendation: Heart healthy-carbohydrate modified  Filed Weights   11/14/12 0424 11/15/12 0031 11/16/12 0430  Weight: 69.9 kg (154 lb 1.6 oz) 70.7 kg (155 lb 13.8 oz) 74.2 kg (163 lb 9.3 oz)    History of present illness:  57 y.o. female with history of hypertension, diabetes, and tobacco use who presented with c/o chest pain. She reported that on 11/11/2012 she developed bilateral upper extremity heaviness. She also had noted constant burning-like sensations in her chest. She denied any jaw pain or any diaphoresis. She checked her blood pressure at home and it was elevated. She had not been taking her medications consistently as she has been taking infrequently to stretch her medications due to cost issues. She took her medications on day of admission and afterwards she felt her chest pain had improved. She also had noted increasing fatigue and activity intolerance. She presented to the emergency department for further evaluation. In the emergency department the patient was found to have a blood pressure of 198/103. Her point-of-care troponin was elevated at 0.74. Cardiology was consulted. Dr. Jacinto Halim requested  hospitalist service admit the patient for further care and management. Patient was started on therapeutic Lovenox, aspirin, Plavix, IV metoprolol and nitroglycerin paste.   Hospital Course:  Chest pain / NSTEMI  -TNI peaked at 4.49 and no significant EKG changes -EF 60% per cath -cardiac cath this admit: non obstructive LAD disease but a 99% stenosis mid Cfx so DES was placed  -Cardiology recommends Effient and baby ASA for a minimum of one year  -will continue Beta Blocker after dc - decrease prior ACE I dose and D/C Norvasc  -Case Manager consulted for medication assistance esp with need for Effient which is very costly: 5 mg tabs $273.19 for 30 tabs and 10 mg tabs $214.75 for 30 tabs -she will receive a one time one month supply then has a card to $50 off on future prescriptions  -OK to return to work November 20, 2012 per cardiologist  **[employer]: Metoprolol-Lisinopril-Glyburide-Metformin-fenofibrate-simvastatin   Hypertensive emergency  -better with treatment of coronary ischemia/pain  -pre admit and new med changes as above    DM (diabetes mellitus), type 2, uncontrolled with complications  -was unable to procure meds consistently pre admission due to finacial limitations  -On Metformin pre admit per med rec so this medicine was held for 48 hours post cath  -was also on Glyburide so have provided generic prescriptions for both meds for use at discharge -Hgb A1c was 6.6  Hypokalemia  -resolved after repleted   Tobacco abuse  -smoking cessation counseling   Mixed hyperlipidemia  -TC > 300 and TG markedly elevated > 2000  -unable to obtain meds consistently  -if lipids not better controlled she will likely have further progression of her  CAD and develop other vascular disease such as PVD, renal dysfunction or carotid stenosis  -see above re: generic meds at dc  -not sure if Lovaza will be covered at [facility] and cost may exceed pt ability to pay (per info on epocrates this med is  $236.87 for 120 caps)  Anxiety -patient's significant other endorsed patient with significant anxiety issues- pt confirms -begin Vistaril prn after discharge   Procedures: Cardiac catheterization  Two-vessel coronary artery disease with a subtotally occluded mid circumflex coronary artery. The proximal to midsegment of the LAD shows a heavy plaque burden and 70% stenosis. Preserved left systolic function, EF 60%.  Successful PTCA and stenting of the proximal and midsegment of the circumflex coronary artery with implantation of a 2.5 x 24 mm promos DES . Will need Dual antiplatelet therapy with Effient and ASA 81 mg for at least one year. Cardiologist has discussed the medication for the patient and will try to arrange drug assistance   Consultations: Cardiology   Discharge Exam: Filed Vitals:   11/15/12 2350 11/16/12 0000 11/16/12 0430 11/16/12 0742  BP: 127/59 127/59 122/64 138/71  Pulse: 62 64 66 69  Temp:  98.8 F (37.1 C) 97.9 F (36.6 C) 98.5 F (36.9 C)  TempSrc:  Oral Oral Oral  Resp:  18 18 18   Height:      Weight:   74.2 kg (163 lb 9.3 oz)   SpO2: 94% 97% 94% 90%   General: No acute respiratory distress Lungs: Clear to auscultation bilaterally without wheezes or crackles, RA Cardiovascular: Regular rate and rhythm without murmur gallop or rub normal S1 and S2, no peripheral edema or JVD Abdomen: Nontender, nondistended, soft, bowel sounds positive, no rebound, no ascites, no appreciable mass Musculoskeletal: No significant cyanosis, clubbing of bilateral lower extremities Neurological: Alert and oriented x 3, moves all extremities x 4 without focal neurological deficits, CN 2-12 intact   Discharge Instructions      Discharge Orders   Future Orders Complete By Expires     Amb Referral to Cardiac Rehabilitation  As directed     Call MD for:  extreme fatigue  As directed     Call MD for:  persistant dizziness or light-headedness  As directed     Call MD for:   redness, tenderness, or signs of infection (pain, swelling, redness, odor or green/yellow discharge around incision site)  As directed     Call MD for:  severe uncontrolled pain  As directed     Diet - low sodium heart healthy  As directed     Diet Carb Modified  As directed     Increase activity slowly  As directed     Scheduling Instructions:      Return to work November 20, 2012 (see note)        Medication List    STOP taking these medications       amLODipine 10 MG tablet  Commonly known as:  NORVASC     pravastatin 80 MG tablet  Commonly known as:  PRAVACHOL  Replaced by:  simvastatin 40 MG tablet      TAKE these medications       aspirin 81 MG chewable tablet  Chew 81 mg by mouth daily.     fenofibrate 160 MG tablet  Take 1 tablet (160 mg total) by mouth daily.     glyBURIDE 5 MG tablet  Commonly known as:  DIABETA  Take 2 tablets (10 mg total) by mouth daily with  breakfast.     hydrOXYzine 25 MG capsule  Commonly known as:  VISTARIL  Take 1 capsule (25 mg total) by mouth 3 (three) times daily as needed for anxiety.     Krill Oil 1000 MG Caps  Take 1 capsule by mouth daily.     lisinopril 20 MG tablet  Commonly known as:  PRINIVIL,ZESTRIL  Take 0.5 tablets (10 mg total) by mouth daily.     metFORMIN 500 MG tablet  Commonly known as:  GLUCOPHAGE  Take 500 mg by mouth 2 (two) times daily with a meal.     metoprolol tartrate 25 MG tablet  Commonly known as:  LOPRESSOR  Take 1 tablet (25 mg total) by mouth 2 (two) times daily.     NATURAL WATER PILLS Tabs  Generic drug:  BucAlfAspKGlucCouchParsUvaUrJu  Take 1 tablet by mouth daily.     omega-3 acid ethyl esters 1 G capsule  Commonly known as:  LOVAZA  Take 1 capsule (1 g total) by mouth daily.     prasugrel 10 MG Tabs  Commonly known as:  EFFIENT  Take 1 tablet (10 mg total) by mouth daily.     simvastatin 40 MG tablet  Commonly known as:  ZOCOR  Take 1 tablet (40 mg total) by mouth daily at 6 PM.        Allergies  Allergen Reactions  . Penicillins Rash   Follow-up Information   Follow up with Pamella Pert, MD On 12/04/2012. (Arrive at 230 pm for 3pm appointment. Bring all medicines with you including over the counter)    Contact information:   [address] [place] [address] [phone]       Follow up with [facility]    . (Please call if appointment was not made prior to discharge. You need primary MD to follow your diabetes)    Contact information:   [address] [place] [address]        The results of significant diagnostics from this hospitalization (including imaging, microbiology, ancillary and laboratory) are listed below for reference.    Significant Diagnostic Studies: Dg Chest 2 View  11/13/2012   *RADIOLOGY REPORT*  Clinical Data: High blood pressure and chest congestion.  Chest pain.  CHEST - 2 VIEW  Comparison: 04/06/2012.  Findings: Chronic cardiomegaly.  Unchanged upper mediastinal contours.  Negative for edema, effusion, pneumothorax, or focal infiltrate. Linear opacities in the lower lungs in the lateral projection, atelectasis versus scarring.  No acute osseous abnormality.  IMPRESSION: Cardiomegaly without failure.   Original Report Authenticated By: Tiburcio Pea    Microbiology: Recent Results (from the past 240 hour(s))  MRSA PCR SCREENING     Status: None   Collection Time    11/14/12  2:27 AM      Result Value Range Status   MRSA by PCR NEGATIVE  NEGATIVE Final   Comment:            The GeneXpert MRSA Assay (FDA     approved for NASAL specimens     only), is one component of a     comprehensive MRSA colonization     surveillance program. It is not     intended to diagnose MRSA     infection nor to guide or     monitor treatment for     MRSA infections.     Labs: Basic Metabolic Panel:  Recent Labs Lab 11/13/12 2243 11/14/12 0355 11/15/12 2130  NA 136 135 138  K  3.3* 3.9 3.8  CL 96 97 102  CO2 24 26 25   GLUCOSE 241* 221* 197*  BUN 8 8 9   CREATININE 0.54 0.56 0.55  CALCIUM 9.8 8.9 8.9  MG  --  1.5  --    Liver Function Tests: No results found for this basename: AST, ALT, ALKPHOS, BILITOT, PROT, ALBUMIN,  in the last 168 hours  Recent Labs Lab 11/14/12 0730  LIPASE 53   No results found for this basename: AMMONIA,  in the last 168 hours CBC:  Recent Labs Lab 11/13/12 2243 11/14/12 0355 11/15/12 0635  WBC 8.5 10.9* 5.9  HGB 16.4* 15.2* 12.7  HCT 44.8 42.4 37.2  MCV 87.2 87.6 89.0  PLT 194 201 160   Cardiac Enzymes:  Recent Labs Lab 11/13/12 2242 11/14/12 0355 11/14/12 0730 11/15/12 0950  CKTOTAL 76  --   --   --   CKMB 8.6*  --   --   --   TROPONINI  --  2.82* 4.49* 1.34*   BNP: BNP (last 3 results)  Recent Labs  11/13/12 2242  PROBNP 1049.0*   CBG:  Recent Labs Lab 11/15/12 0814 11/15/12 1124 11/15/12 1733 11/15/12 2115 11/16/12 0819  GLUCAP 198* 189* 276* 207* 167*      patient seen and examined with nurse practitioner And agree with the assessment and plan Kameka Whan  Signed:  ELLIS,ALLISON L. ANP [facility] 11/16/2012, 11:26 AM

## 2012-11-16 NOTE — Progress Notes (Addendum)
Subjective:  No further chest pain. Patient is presently doing well. And bleeding in the hallway without any chest pain. Patient very motivated for smoking cessation  Objective:  Vital Signs in the last 24 hours: Temp:  [97.7 F (36.5 C)-98.8 F (37.1 C)] 98.5 F (36.9 C) (07/18 0742) Pulse Rate:  [62-73] 69 (07/18 0742) Resp:  [18-20] 18 (07/18 0742) BP: (116-138)/(59-71) 138/71 mmHg (07/18 0742) SpO2:  [90 %-97 %] 90 % (07/18 0742) Weight:  [74.2 kg (163 lb 9.3 oz)] 74.2 kg (163 lb 9.3 oz) (07/18 0430)  Intake/Output from previous day: 07/17 0701 - 07/18 0700 In: 840 [P.O.:840] Out: 1 [Stool:1]  Physical Exam: General appearance: alert, cooperative, appears older than stated age and no distress  Lungs: clear to auscultation bilaterally  Chest wall: no tenderness  Heart: S1 is normal, S2 is widely split but varies normally with respiration. There is no gallop or rub.  Abdomen: soft, non-tender; bowel sounds normal; no masses, no organomegaly  Extremities: extremities normal, atraumatic, no cyanosis or edema  Pulses: 2+ and symmetric  Neurologic: Grossly normal  Lab Results:  Recent Labs  11/14/12 0355 11/15/12 0635  WBC 10.9* 5.9  HGB 15.2* 12.7  PLT 201 160    Recent Labs  11/14/12 0355 11/15/12 0635  NA 135 138  K 3.9 3.8  CL 97 102  CO2 26 25  GLUCOSE 221* 197*  BUN 8 9  CREATININE 0.56 0.55    Recent Labs  11/14/12 0730 11/15/12 0950  TROPONINI 4.49* 1.34*   Hepatic Function Panel No results found for this basename: PROT, ALBUMIN, AST, ALT, ALKPHOS, BILITOT, BILIDIR, IBILI,  in the last 72 hours Lipid Panel     Component Value Date/Time   CHOL 336* 11/14/2012 0335   TRIG 2223* 11/14/2012 0335   HDL NOT REPORTED DUE TO HIGH TRIGLYCERIDES 11/14/2012 0335   CHOLHDL NOT REPORTED DUE TO HIGH TRIGLYCERIDES 11/14/2012 0335   VLDL UNABLE TO CALCULATE IF TRIGLYCERIDE OVER 400 mg/dL 1/61/0960 4540   LDLCALC UNABLE TO CALCULATE IF TRIGLYCERIDE OVER 400  mg/dL 9/81/1914 7829   EKG: NSR, RBBB.  Scheduled Meds: . aspirin EC  81 mg Oral Daily  . fenofibrate  160 mg Oral Daily  . glyBURIDE  10 mg Oral Q breakfast  . insulin aspart  0-15 Units Subcutaneous TID WC  . insulin aspart  0-5 Units Subcutaneous QHS  . lisinopril  10 mg Oral Daily  . metoprolol tartrate  12.5 mg Oral BID  . nicotine  21 mg Transdermal Daily  . nitroGLYCERIN  1 inch Topical Q8H  . omega-3 acid ethyl esters  1 g Oral Daily  . prasugrel  10 mg Oral Daily  . simvastatin  40 mg Oral q1800  . sodium chloride  3 mL Intravenous Q12H   Continuous Infusions:  PRN Meds:.acetaminophen, acetaminophen, morphine injection, ondansetron (ZOFRAN) IV, ondansetron  Assessment/Plan:  1. Non-ST elevation myocardial infarction S/P PTCA and stenting of the proximal and mid circumflex coronary artery with implantation of a 2.5 x 24 mm promos drug-eluting stent on 11/14/2012. Has a proximal to mid segment LAD 70% stenosis. Normal left systolic function.  2. Hypertension 3. Diabetes mellitus type 2 uncontrolled 4. Mixed hyperlipidemia 5. Continued tobacco use disorder.  Recommendation: Patient presently asymptomatic, however has multiple cardiovascular risk factors that are still uncontrolled and also has medication issues. Patient is presently doing well and is on appropriate medical therapy. She can be discharged home with outpatient follow up with me. I will see the  patient on Tuesday, August 5 at 3 PM, patient arrived at 2:30 to bring all medications with her.  patient can return to work on Tuesday, 11/20/2012   Pamella Pert, M.D. 11/16/2012, 9:19 AM [facility] Pager: [phone] Office: [phone] If no answer: [phone]

## 2012-11-16 NOTE — Progress Notes (Signed)
11/16/12... Email and call in to [facility] to set up PCP.    11/16/12... Pt agrees to go to [facility] for meds as [facility] program has similar co-pays ($3 for 34 days). Sekou Zuckerman J. Lucretia Roers, RN, BSN, [employer] [phone].

## 2014-04-10 ENCOUNTER — Encounter (HOSPITAL_COMMUNITY): Payer: Self-pay | Admitting: Cardiology

## 2015-01-20 ENCOUNTER — Emergency Department (INDEPENDENT_AMBULATORY_CARE_PROVIDER_SITE_OTHER)
Admission: EM | Admit: 2015-01-20 | Discharge: 2015-01-20 | Disposition: A | Discharge status: Home / Self Care | Payer: PRIVATE HEALTH INSURANCE | Source: Home / Self Care | Attending: Emergency Medicine | Admitting: Emergency Medicine

## 2015-01-20 ENCOUNTER — Encounter (HOSPITAL_COMMUNITY): Payer: Self-pay | Admitting: Emergency Medicine

## 2015-01-20 DIAGNOSIS — E119 Type 2 diabetes mellitus without complications: Secondary | ICD-10-CM

## 2015-01-20 DIAGNOSIS — I10 Essential (primary) hypertension: Secondary | ICD-10-CM | POA: Diagnosis not present

## 2015-01-20 LAB — POCT I-STAT, CHEM 8
BUN: 13 mg/dL (ref 6–20)
CHLORIDE: 99 mmol/L — AB (ref 101–111)
Calcium, Ion: 1.16 mmol/L (ref 1.12–1.23)
Creatinine, Ser: 0.8 mg/dL (ref 0.44–1.00)
Glucose, Bld: 335 mg/dL — ABNORMAL HIGH (ref 65–99)
HEMATOCRIT: 45 % (ref 36.0–46.0)
HEMOGLOBIN: 15.3 g/dL — AB (ref 12.0–15.0)
POTASSIUM: 4 mmol/L (ref 3.5–5.1)
SODIUM: 136 mmol/L (ref 135–145)
TCO2: 29 mmol/L (ref 0–100)

## 2015-01-20 MED ORDER — GLYBURIDE 5 MG PO TABS: 10.0000 mg | ORAL_TABLET | Freq: Every day | ORAL | Status: DC

## 2015-01-20 MED ORDER — METOPROLOL TARTRATE 25 MG PO TABS: 25.0000 mg | ORAL_TABLET | Freq: Two times a day (BID) | ORAL | Status: DC

## 2015-01-20 MED ORDER — LISINOPRIL 10 MG PO TABS: 10.0000 mg | ORAL_TABLET | Freq: Every day | ORAL | Status: DC

## 2015-01-20 MED ORDER — METFORMIN HCL 500 MG PO TABS: 500.0000 mg | ORAL_TABLET | Freq: Two times a day (BID) | ORAL | Status: DC

## 2015-01-20 NOTE — ED Notes (Signed)
Pt is here to have medications refilled Has not had a PCP "in a while" Last had metformin 2 days ago; has been spacing meds out Blood sugar was 424 this afternoon Alert and oriented x4... No acute distress.

## 2015-01-20 NOTE — Discharge Instructions (Signed)
I have refilled your diabetes and blood pressure medicines. Please call Maggy tomorrow for help with finding a primary care doctor. Follow-up as needed.

## 2015-01-20 NOTE — ED Provider Notes (Signed)
CSN: [id number]     Arrival date & time 01/20/15  1555 History   First MD Initiated Contact with Patient 01/20/15 1733     Chief Complaint  Patient presents with  . Medication Refill   (Consider location/radiation/quality/duration/timing/severity/associated sxs/prior Treatment) HPI She is a 59 year old [name] here for medication refill. She states that the last time she had all of her blood pressure and diabetes medicines was in December. She has not had any blood pressure medicine for at least 2 months. She has been stretching out her diabetes medications. She has been taking metformin a couple times a week for the last several months. She states she just got insurance. She denies chest pain or shortness of breath. She does report dry mouth and poly-DIP Sia as well as polyuria. She states her blood sugar was over 400 today. She denies abdominal pain, but does describe nausea.  Past Medical History  Diagnosis Date  . Hypertension   . Diabetes mellitus without complication    Past Surgical History  Procedure Laterality Date  . Left heart catheterization with coronary angiogram N/A Rebecca/16/2014    Procedure: LEFT HEART CATHETERIZATION WITH CORONARY ANGIOGRAM;  Surgeon: Pamella Pert, MD;  Location: [facility] CATH LAB;  Service: Cardiovascular;  Laterality: N/A;  . Percutaneous coronary stent intervention (pci-s)  Rebecca/16/2014    Procedure: PERCUTANEOUS CORONARY STENT INTERVENTION (PCI-S);  Surgeon: Pamella Pert, MD;  Location: [facility] CATH LAB;  Service: Cardiovascular;;   Family History  Problem Relation Age of Onset  . Diabetes Brother   . Heart disease Brother   . Arthritis Brother   . Diabetes Father   . Heart disease Father    Social History  Substance Use Topics  . Smoking status: Current Every Day Smoker -- 1.00 packs/day    Types: Cigarettes  . Smokeless tobacco: None  . Alcohol Use: None   OB History    No data available     Review of Systems As in history of present  illness Allergies  Penicillins  Home Medications   Prior to Admission medications   Medication Sig Start Date End Date Taking? Authorizing Provider  aspirin 81 MG chewable tablet Chew 81 mg by mouth daily.    Historical Provider, MD  BucAlfAspKGlucCouchParsUvaUrJu (NATURAL WATER PILLS) TABS Take 1 tablet by mouth daily.    Historical Provider, MD  fenofibrate 160 MG tablet Take 1 tablet (160 mg total) by mouth daily. Rebecca/18/14   Russella Dar, NP  glyBURIDE (DIABETA) 5 MG tablet Take 2 tablets (10 mg total) by mouth daily with breakfast. 01/20/15   Charm Rings, MD  hydrOXYzine (VISTARIL) 25 MG capsule Take 1 capsule (25 mg total) by mouth 3 (three) times daily as needed for anxiety. Rebecca/18/14   Russella Dar, NP  KRILL OIL 1000 MG CAPS Take 1 capsule by mouth daily.    Historical Provider, MD  lisinopril (PRINIVIL,ZESTRIL) 10 MG tablet Take 1 tablet (10 mg total) by mouth daily. 01/20/15   Charm Rings, MD  metFORMIN (GLUCOPHAGE) 500 MG tablet Take 1 tablet (500 mg total) by mouth 2 (two) times daily with a meal. 01/20/15   Charm Rings, MD  metoprolol tartrate (LOPRESSOR) 25 MG tablet Take 1 tablet (25 mg total) by mouth 2 (two) times daily. 01/20/15   Charm Rings, MD  omega-3 acid ethyl esters (LOVAZA) 1 G capsule Take 1 capsule (1 g total) by mouth daily. Rebecca/18/14   Russella Dar, NP  prasugrel (EFFIENT) 10 MG  TABS Take 1 tablet (10 mg total) by mouth daily. Rebecca/18/14   Russella Dar, NP  simvastatin (ZOCOR) 40 MG tablet Take 1 tablet (40 mg total) by mouth daily at 6 PM. Rebecca/18/14   Russella Dar, NP   Meds Ordered and Administered this Visit  Medications - No data to display  BP 198/89 mmHg  Pulse 81  Temp(Src) 98 F (36.Rebecca C) (Oral)  Resp 20  SpO2 98% No data found.   Physical Exam  Constitutional: She is oriented to person, place, and time. She appears well-developed and well-nourished. No distress.  HENT:  Slightly dry mucous membranes  Neck: Neck supple.  Cardiovascular:  Normal rate, regular rhythm and normal heart sounds.   No murmur heard. Pulmonary/Chest: Effort normal and breath sounds normal. No respiratory distress. She has no wheezes. She has no rales.  Neurological: She is alert and oriented to person, place, and time.    ED Course  Procedures (including critical care time)  Labs Review Labs Reviewed  POCT I-STAT, CHEM 8 - Abnormal; Notable for the following:    Chloride 99 (*)    Glucose, Bld 335 (*)    Hemoglobin 15.3 (*)    All other components within normal limits    Imaging Review No results found.   MDM   1. Essential hypertension   2. Type 2 diabetes mellitus without complication    I-STAT shows elevated glucose at 335. She does not have an elevated anion gap. Her bicarbonate is normal at 29. I have refilled her diabetic and hypertension medications. Recommended that she contact Maggy for help with finding a primary care doctor.    Charm Rings, MD 01/20/15 [phone]

## 2016-03-01 ENCOUNTER — Ambulatory Visit (HOSPITAL_COMMUNITY)
Admission: EM | Admit: 2016-03-01 | Discharge: 2016-03-01 | Disposition: A | Discharge status: Home / Self Care | Payer: PRIVATE HEALTH INSURANCE | Attending: Emergency Medicine | Admitting: Emergency Medicine

## 2016-03-01 ENCOUNTER — Encounter (HOSPITAL_COMMUNITY): Payer: Self-pay | Admitting: Emergency Medicine

## 2016-03-01 DIAGNOSIS — L0291 Cutaneous abscess, unspecified: Secondary | ICD-10-CM

## 2016-03-01 MED ORDER — METFORMIN HCL 500 MG PO TABS: 500.0000 mg | ORAL_TABLET | Freq: Two times a day (BID) | ORAL | 2 refills | Status: DC

## 2016-03-01 MED ORDER — LISINOPRIL 10 MG PO TABS: 10.0000 mg | ORAL_TABLET | Freq: Every day | ORAL | 2 refills | Status: DC

## 2016-03-01 MED ORDER — METOPROLOL TARTRATE 25 MG PO TABS: 25.0000 mg | ORAL_TABLET | Freq: Two times a day (BID) | ORAL | 2 refills | Status: DC

## 2016-03-01 MED ORDER — SULFAMETHOXAZOLE-TRIMETHOPRIM 800-160 MG PO TABS: 1.0000 | ORAL_TABLET | Freq: Two times a day (BID) | ORAL | 0 refills | Status: AC

## 2016-03-01 NOTE — ED Triage Notes (Addendum)
[facility] with a complaint of an abscess on the back of her neck that has been there for 2 months. The patient stated that she believed it was from an insect bite and it started to drain about 1 week ago. The patient stated that she has been using neosporin.  The patient also requested refills on her glyburide, lisinopril, metformin and metoprolol. She stated that she last took them in August. She stated that she has been out and has not found a MD to refill them.

## 2016-03-01 NOTE — ED Provider Notes (Signed)
[facility]    CSN: [id number] Arrival date & time: 03/01/16  1407     History   Chief Complaint Chief Complaint  Patient presents with  . Abscess    HPI Rebecca [name] is a 60 y.o. female.   HPI  She is a 60 year old woman here for evaluation of abscess. She states she has had a pimple on the back of her neck for about 2 months. She thinks this is secondary to an insect bite. Last week, it started to drain and since then it has been getting larger and more painful.  She denies any fevers.  She also states she has been out of her blood pressure and diabetes medications for several months. She has had a hard time finding a new doctor. She does check her blood sugar at home and it has been less than 200.  Past Medical History:  Diagnosis Date  . Diabetes mellitus without complication (HCC)   . Hypertension     Patient Active Problem List   Diagnosis Date Noted  . Hypertensive emergency 11/14/2012  . Hypokalemia 11/14/2012  . NSTEMI (non-ST elevated myocardial infarction) (HCC) 11/14/2012  . Hyponatremia 04/08/2012  . Hypertension 04/06/2012  . Chest pain 04/06/2012  . DM (diabetes mellitus), type 2, uncontrolled with complications (HCC) 04/06/2012  . Tobacco abuse 04/06/2012  . Mixed hyperlipidemia 04/06/2012    Past Surgical History:  Procedure Laterality Date  . LEFT HEART CATHETERIZATION WITH CORONARY ANGIOGRAM N/A 11/14/2012   Procedure: LEFT HEART CATHETERIZATION WITH CORONARY ANGIOGRAM;  Surgeon: Pamella Pert, MD;  Location: [facility] CATH LAB;  Service: Cardiovascular;  Laterality: N/A;  . PERCUTANEOUS CORONARY STENT INTERVENTION (PCI-S)  11/14/2012   Procedure: PERCUTANEOUS CORONARY STENT INTERVENTION (PCI-S);  Surgeon: Pamella Pert, MD;  Location: [facility] CATH LAB;  Service: Cardiovascular;;    OB History    No data available       Home Medications    Prior to Admission medications   Medication Sig Start Date End Date Taking? Authorizing  Provider  aspirin 81 MG chewable tablet Chew 81 mg by mouth daily.   Yes Historical Provider, MD  KRILL OIL 1000 MG CAPS Take 1 capsule by mouth daily.   Yes Historical Provider, MD  BucAlfAspKGlucCouchParsUvaUrJu (NATURAL WATER PILLS) TABS Take 1 tablet by mouth daily.    Historical Provider, MD  fenofibrate 160 MG tablet Take 1 tablet (160 mg total) by mouth daily. 11/16/12   Russella Dar, NP  glyBURIDE (DIABETA) 5 MG tablet Take 2 tablets (10 mg total) by mouth daily with breakfast. 01/20/15   Charm Rings, MD  hydrOXYzine (VISTARIL) 25 MG capsule Take 1 capsule (25 mg total) by mouth 3 (three) times daily as needed for anxiety. 11/16/12   Russella Dar, NP  lisinopril (PRINIVIL,ZESTRIL) 10 MG tablet Take 1 tablet (10 mg total) by mouth daily. 03/01/16   Charm Rings, MD  metFORMIN (GLUCOPHAGE) 500 MG tablet Take 1 tablet (500 mg total) by mouth 2 (two) times daily with a meal. 03/01/16   Charm Rings, MD  metoprolol tartrate (LOPRESSOR) 25 MG tablet Take 1 tablet (25 mg total) by mouth 2 (two) times daily. 03/01/16   Charm Rings, MD  omega-3 acid ethyl esters (LOVAZA) 1 G capsule Take 1 capsule (1 g total) by mouth daily. 11/16/12   Russella Dar, NP  prasugrel (EFFIENT) 10 MG TABS Take 1 tablet (10 mg total) by mouth daily. 11/16/12   Russella Dar, NP  simvastatin (ZOCOR) 40 MG tablet Take 1 tablet (40 mg total) by mouth daily at 6 PM. 11/16/12   Russella DarAllison L Ellis, NP  sulfamethoxazole-trimethoprim (BACTRIM DS,SEPTRA DS) 800-160 MG tablet Take 1 tablet by mouth 2 (two) times daily. 03/01/16 03/08/16  Charm RingsErin J Elinda Bunten, MD    Family History Family History  Problem Relation Age of Onset  . Diabetes Brother   . Heart disease Brother   . Arthritis Brother   . Diabetes Father   . Heart disease Father     Social History Social History  Substance Use Topics  . Smoking status: Current Every Day Smoker    Packs/day: 1.00    Types: Cigarettes  . Smokeless tobacco: Never Used  . Alcohol use  Not on file     Allergies   Penicillins   Review of Systems Review of Systems As in history of present illness  Physical Exam Triage Vital Signs ED Triage Vitals  Enc Vitals Group     BP 03/01/16 1418 (!) 217/91     Pulse Rate 03/01/16 1418 80     Resp 03/01/16 1418 16     Temp 03/01/16 1418 97.6 F (36.4 C)     Temp Source 03/01/16 1418 Oral     SpO2 03/01/16 1418 100 %     Weight --      Height --      Head Circumference --      Peak Flow --      Pain Score 03/01/16 1422 0     Pain Loc --      Pain Edu? --      Excl. in GC? --    No data found.   Updated Vital Signs BP (!) 217/91 (BP Location: Left Arm) Comment: notified rn  Pulse 80   Temp 97.6 F (36.4 C) (Oral)   Resp 16   SpO2 100%   Visual Acuity Right Eye Distance:   Left Eye Distance:   Bilateral Distance:    Right Eye Near:   Left Eye Near:    Bilateral Near:     Physical Exam  Constitutional: She is oriented to person, place, and time. She appears well-developed and well-nourished. No distress.  Cardiovascular: Normal rate.   Pulmonary/Chest: Effort normal.  Neurological: She is alert and oriented to person, place, and time.  Skin:  2 cm abscess with overlying erythema at the nape of her neck. There is an opening, but I am unable to express any purulence.     UC Treatments / Results  Labs (all labs ordered are listed, but only abnormal results are displayed) Labs Reviewed - No data to display  EKG  EKG Interpretation None       Radiology No results found.  Procedures .Marland Kitchen.Incision and Drainage Date/Time: 03/01/2016 2:50 PM Performed by: Charm RingsHONIG, Gennavieve Huq J Authorized by: Charm RingsHONIG, Errik Mitchelle J   Consent:    Consent obtained:  Verbal   Consent given by:  Patient   Risks discussed:  Incomplete drainage Location:    Type:  Abscess   Size:  2cm   Location:  Neck   Neck location: Midline posterior. Pre-procedure details:    Procedure prep: Alcohol swab. Anesthesia (see MAR for exact  dosages):    Anesthesia method:  Local infiltration   Local anesthetic:  Lidocaine 2% w/o epi Procedure type:    Complexity:  Simple Procedure details:    Incision types:  Single straight   Incision depth:  Dermal   Scalpel blade:  11  Wound management:  Probed and deloculated   Drainage:  Purulent   Drainage amount:  Moderate   Wound treatment:  Wound left open   Packing materials:  None Post-procedure details:    Patient tolerance of procedure:  Tolerated well, no immediate complications   (including critical care time)  Medications Ordered in UC Medications - No data to display   Initial Impression / Assessment and Plan / UC Course  I have reviewed the triage vital signs and the nursing notes.  Pertinent labs & imaging results that were available during my care of the patient were reviewed by me and considered in my medical decision making (see chart for details).  Clinical Course    Abscess drain today. Will treat with Bactrim for 7 days. Patient has tolerated this well the past. I provided 3 months supply of her metformin, metoprolol, and lisinopril. I did not refill her glyburide given her blood sugars are less than 200.  Final Clinical Impressions(s) / UC Diagnoses   Final diagnoses:  Abscess    New Prescriptions New Prescriptions   SULFAMETHOXAZOLE-TRIMETHOPRIM (BACTRIM DS,SEPTRA DS) 800-160 MG TABLET    Take 1 tablet by mouth 2 (two) times daily.     Charm RingsErin J Kalila Adkison, MD 03/01/16 [phone]

## 2016-03-01 NOTE — Discharge Instructions (Signed)
We drained the abscess today. Wash it gently with soap and water twice a day. Keep it covered for the next 2 days, then leave it open. Take Bactrim twice a day for 7 days. Take this with food.  I've provided a 3 month supply of your blood pressure and diabetes medicines.  Follow-up as needed.

## 2017-11-06 ENCOUNTER — Ambulatory Visit: Payer: Self-pay | Admitting: Nurse Practitioner

## 2017-11-06 VITALS — BP 220/95 | HR 74 | Temp 97.8°F | Resp 16 | Wt 157.6 lb

## 2017-11-06 DIAGNOSIS — L03011 Cellulitis of right finger: Secondary | ICD-10-CM

## 2017-11-06 DIAGNOSIS — Z760 Encounter for issue of repeat prescription: Secondary | ICD-10-CM

## 2017-11-06 MED ORDER — SULFAMETHOXAZOLE-TRIMETHOPRIM 800-160 MG PO TABS: 1.0000 | ORAL_TABLET | Freq: Two times a day (BID) | ORAL | 0 refills | Status: AC

## 2017-11-06 MED ORDER — METOPROLOL TARTRATE 25 MG PO TABS: 25.0000 mg | ORAL_TABLET | Freq: Two times a day (BID) | ORAL | 0 refills | Status: DC

## 2017-11-06 MED ORDER — GLYBURIDE 5 MG PO TABS: 10.0000 mg | ORAL_TABLET | Freq: Every day | ORAL | 0 refills | Status: DC

## 2017-11-06 MED ORDER — SIMVASTATIN 40 MG PO TABS: 40.0000 mg | ORAL_TABLET | Freq: Every day | ORAL | 0 refills | Status: DC

## 2017-11-06 MED ORDER — METFORMIN HCL 500 MG PO TABS: 500.0000 mg | ORAL_TABLET | Freq: Two times a day (BID) | ORAL | 0 refills | Status: DC

## 2017-11-06 MED ORDER — LISINOPRIL 10 MG PO TABS: 10.0000 mg | ORAL_TABLET | Freq: Every day | ORAL | 0 refills | Status: DC

## 2017-11-06 NOTE — Progress Notes (Signed)
Subjective:    Patient ID: Rebecca [name], female    DOB: 1955-09-11, 62 y.o.   MRN: [id number]    Rebecca [name] is a 62 y.o. female who presents for evaluation of a possible skin infection located right middle finger.  Patient states her finger had an ingrown nail approximately 11 days ago and she noticed "separation of the cuticle".  Symptoms include moderate pain and erythema located right middle finger. Patient denies moderate pain. Precipitating event: ingrown nail. Treatment to date has included Epsom salt soaks with minimal relief.    Medication Refill  This is a new ( Patient states she has a history of HTN, DM, hyperlipidemia, but has been unable to refill her medications because she has no insurance and no PCP. Patient denies any symptoms such as chest pain, palpitations, LE edema, SOB, hyperglycemia/hypolglycemia) problem. The current episode started more than 1 year ago. Pertinent negatives include no headaches. Associated symptoms comments: Blurred vision, LE swelling, SOB or palpitations   Since states she has stopped smoking since January.  Patient states she started smoking at the age of 36.  Smoking history includes 30 pack-years.  She quit in January, 2019.Marland Kitchen Nothing aggravates the symptoms. She has tried nothing for the symptoms.    Review of Systems  Constitutional: Negative.   HENT: Negative.   Respiratory: Negative.   Cardiovascular: Negative.   Gastrointestinal: Negative.   Musculoskeletal:       Right middle finger pain, swelling and redness to cuticle.  Skin:       Swelling and redness to right middle finger.  Neurological: Negative for headaches.      Objective:   Physical Exam  Constitutional: She is oriented to person, place, and time. She appears well-developed and well-nourished. No distress.  HENT:  Head: Normocephalic and atraumatic.  Eyes: Pupils are equal, round, and reactive to light. Conjunctivae and EOM are normal.  Neck: Normal range of motion.  Neck supple. No tracheal deviation present. No thyromegaly present.  Cardiovascular: Normal rate, regular rhythm and normal heart sounds.  Pulmonary/Chest: Effort normal and breath sounds normal. No respiratory distress. She has no wheezes.  Abdominal: Soft. Bowel sounds are normal. She exhibits no distension. There is no tenderness.  Musculoskeletal:  Swelling and redness to cuticle bed of right middle finger. Tender to palpation, no drainage at present.  Neurological: She is alert and oriented to person, place, and time.   Patient's blood pressure was rechecked at discharge, BP was 220/110.     Assessment & Plan:  Exam findings, diagnosis etiology and medication use and indications reviewed with patient. Follow- Up and discharge instructions provided.  Patient was instructed to go directly to the emergency room due to her hypertensive episode.  The patient was given refills for 5 of her medications to include glyburide, Zestril, Glucophage, Lopressor, and Zocor.  The patient does not have a PCP and needs to be seen as soon as possible.  Call was made to [facility] internal medicine.  The patient was scheduled an appointment for August 1 at 1 PM.  Stressed to patient the importance of following up at this appointment.  Patient verbalized that she will go to the emergency room when she leaves this appointment, and will also attend the appointment scheduled for her. Patient verbalized understanding of information provided and agrees with plan of care (POC), all questions answered.  Paronychia -Bactrim DS 800/160mg  twice daily for 10 days.  Medication Refill - Medication refills for glyburide, Zestril, Glucophage, Lopressor, and  Zocor - Patient was instructed to go to there ER due to her hypertension. -[facility] at 1[facility].  Meds ordered this encounter  Medications  . glyBURIDE (DIABETA) 5 MG tablet    Sig: Take 2  tablets (10 mg total) by mouth daily with breakfast.    Dispense:  60 tablet    Refill:  0    Order Specific Question:   Supervising Provider    Answer:   Stacie GlazeJENKINS, JOHN E [[id number]]  . lisinopril (PRINIVIL,ZESTRIL) 10 MG tablet    Sig: Take 1 tablet (10 mg total) by mouth daily.    Dispense:  30 tablet    Refill:  0    Order Specific Question:   Supervising Provider    Answer:   Stacie GlazeJENKINS, JOHN E [[id number]]  . metFORMIN (GLUCOPHAGE) 500 MG tablet    Sig: Take 1 tablet (500 mg total) by mouth 2 (two) times daily with a meal.    Dispense:  60 tablet    Refill:  0    Order Specific Question:   Supervising Provider    Answer:   Stacie GlazeJENKINS, JOHN E [[id number]]  . metoprolol tartrate (LOPRESSOR) 25 MG tablet    Sig: Take 1 tablet (25 mg total) by mouth 2 (two) times daily.    Dispense:  60 tablet    Refill:  0    Order Specific Question:   Supervising Provider    Answer:   Stacie GlazeJENKINS, JOHN E [[id number]]  . simvastatin (ZOCOR) 40 MG tablet    Sig: Take 1 tablet (40 mg total) by mouth daily at 6 PM.    Dispense:  30 tablet    Refill:  0    Order Specific Question:   Supervising Provider    Answer:   Stacie GlazeJENKINS, JOHN E [[id number]]  . sulfamethoxazole-trimethoprim (BACTRIM DS) 800-160 MG tablet    Sig: Take 1 tablet by mouth 2 (two) times daily for 10 days.    Dispense:  20 tablet    Refill:  0    Order Specific Question:   Supervising Provider    Answer:   Stacie GlazeJENKINS, JOHN E [phone][[id number]]

## 2017-11-06 NOTE — Patient Instructions (Signed)
Paronychia Paronychia is an infection of the skin that surrounds a nail. It usually affects the skin around a fingernail, but it may also occur near a toenail. It often causes pain and swelling around the nail. This condition may come on suddenly or develop over a longer period. In some cases, a collection of pus (abscess) can form near or under the nail. Usually, paronychia is not serious and it clears up with treatment. What are the causes? This condition may be caused by bacteria or fungi. It is commonly caused by either Streptococcus or Staphylococcus bacteria. The bacteria or fungi often cause the infection by getting into the affected area through an opening in the skin, such as a cut or a hangnail. What increases the risk? This condition is more likely to develop in:  People who get their hands wet often, such as those who work as Designer, industrial/product, bartenders, or nurses.  People who bite their fingernails or suck their thumbs.  People who trim their nails too short.  People who have hangnails or injured fingertips.  People who get manicures.  People who have diabetes.  What are the signs or symptoms? Symptoms of this condition include:  Redness and swelling of the skin near the nail.  Tenderness around the nail when you touch the area.  Pus-filled bumps under the cuticle. The cuticle is the skin at the base or sides of the nail.  Fluid or pus under the nail.  Throbbing pain in the area.  How is this diagnosed? This condition is usually diagnosed with a physical exam. In some cases, a sample of pus may be taken from an abscess to be tested in a lab. This can help to determine what type of bacteria or fungi is causing the condition. How is this treated? Treatment for this condition depends on the cause and severity of the condition. If the condition is mild, it may clear up on its own in a few days. Your health care provider may recommend soaking the affected area in warm water a  few times a day. When treatment is needed, the options may include:  Antibiotic medicine, if the condition is caused by a bacterial infection.  Antifungal medicine, if the condition is caused by a fungal infection.  Incision and drainage, if an abscess is present. In this procedure, the health care provider will cut open the abscess so the pus can drain out.  Follow these instructions at home:  Soak the affected area in warm water if directed to do so by your health care provider. You may be told to do this for 20 minutes, 2-3 times a day. Keep the area dry in between soakings.  Take medicines only as directed by your health care provider.  If you were prescribed an antibiotic medicine, finish all of it even if you start to feel better.  Keep the affected area clean.  Do not try to drain a fluid-filled bump yourself.  If you will be washing dishes or performing other tasks that require your hands to get wet, wear rubber gloves. You should also wear gloves if your hands might come in contact with irritating substances, such as cleaners or chemicals.  Follow your health care provider's instructions about: ? Wound care. ? Bandage (dressing) changes and removal. Contact a health care provider if:  Your symptoms get worse or do not improve with treatment.  You have a fever or chills.  You have redness spreading from the affected area.  You have continued  or increased fluid, blood, or pus coming from the affected area.  Your finger or knuckle becomes swollen or is difficult to move. This information is not intended to replace advice given to you by your health care provider. Make sure you discuss any questions you have with your health care provider. Document Released: 10/12/2000 Document Revised: 09/24/2015 Document Reviewed: 03/26/2014 Elsevier Interactive Patient Education  2018 [employer] for Postmenopausal Women Menopause is a normal process in which  your reproductive ability comes to an end. This process happens gradually over a span of months to years, usually between the ages of 1 and 61. Menopause is complete when you have missed 12 consecutive menstrual periods. It is important to talk with your health care provider about some of the most common conditions that affect postmenopausal women, such as heart disease, cancer, and bone loss (osteoporosis). Adopting a healthy lifestyle and getting preventive care can help to promote your health and wellness. Those actions can also lower your chances of developing some of these common conditions. What should I know about menopause? During menopause, you may experience a number of symptoms, such as:  Moderate-to-severe hot flashes.  Night sweats.  Decrease in sex drive.  Mood swings.  Headaches.  Tiredness.  Irritability.  Memory problems.  Insomnia.  Choosing to treat or not to treat menopausal changes is an individual decision that you make with your health care provider. What should I know about hormone replacement therapy and supplements? Hormone therapy products are effective for treating symptoms that are associated with menopause, such as hot flashes and night sweats. Hormone replacement carries certain risks, especially as you become older. If you are thinking about using estrogen or estrogen with progestin treatments, discuss the benefits and risks with your health care provider. What should I know about heart disease and stroke? Heart disease, heart attack, and stroke become more likely as you age. This may be due, in part, to the hormonal changes that your body experiences during menopause. These can affect how your body processes dietary fats, triglycerides, and cholesterol. Heart attack and stroke are both medical emergencies. There are many things that you can do to help prevent heart disease and stroke:  Have your blood pressure checked at least every 1-2 years. High  blood pressure causes heart disease and increases the risk of stroke.  If you are 21-60 years old, ask your health care provider if you should take aspirin to prevent a heart attack or a stroke.  Do not use any tobacco products, including cigarettes, chewing tobacco, or electronic cigarettes. If you need help quitting, ask your health care provider.  It is important to eat a healthy diet and maintain a healthy weight. ? Be sure to include plenty of vegetables, fruits, low-fat dairy products, and lean protein. ? Avoid eating foods that are high in solid fats, added sugars, or salt (sodium).  Get regular exercise. This is one of the most important things that you can do for your health. ? Try to exercise for at least 150 minutes each week. The type of exercise that you do should increase your heart rate and make you sweat. This is known as moderate-intensity exercise. ? Try to do strengthening exercises at least twice each week. Do these in addition to the moderate-intensity exercise.  Know your numbers.Ask your health care provider to check your cholesterol and your blood glucose. Continue to have your blood tested as directed by your health care provider.  What should I  know about cancer screening? There are several types of cancer. Take the following steps to reduce your risk and to catch any cancer development as early as possible. Breast Cancer  Practice breast self-awareness. ? This means understanding how your breasts normally appear and feel. ? It also means doing regular breast self-exams. Let your health care provider know about any changes, no matter how small.  If you are 83 or older, have a clinician do a breast exam (clinical breast exam or CBE) every year. Depending on your age, family history, and medical history, it may be recommended that you also have a yearly breast X-ray (mammogram).  If you have a family history of breast cancer, talk with your health care provider  about genetic screening.  If you are at high risk for breast cancer, talk with your health care provider about having an MRI and a mammogram every year.  Breast cancer (BRCA) gene test is recommended for women who have family members with BRCA-related cancers. Results of the assessment will determine the need for genetic counseling and BRCA1 and for BRCA2 testing. BRCA-related cancers include these types: ? Breast. This occurs in males or females. ? Ovarian. ? Tubal. This may also be called fallopian tube cancer. ? Cancer of the abdominal or pelvic lining (peritoneal cancer). ? Prostate. ? Pancreatic.  Cervical, Uterine, and Ovarian Cancer Your health care provider may recommend that you be screened regularly for cancer of the pelvic organs. These include your ovaries, uterus, and vagina. This screening involves a pelvic exam, which includes checking for microscopic changes to the surface of your cervix (Pap test).  For women ages 21-65, health care providers may recommend a pelvic exam and a Pap test every three years. For women ages 71-65, they may recommend the Pap test and pelvic exam, combined with testing for human papilloma virus (HPV), every five years. Some types of HPV increase your risk of cervical cancer. Testing for HPV may also be done on women of any age who have unclear Pap test results.  Other health care providers may not recommend any screening for nonpregnant women who are considered low risk for pelvic cancer and have no symptoms. Ask your health care provider if a screening pelvic exam is right for you.  If you have had past treatment for cervical cancer or a condition that could lead to cancer, you need Pap tests and screening for cancer for at least 20 years after your treatment. If Pap tests have been discontinued for you, your risk factors (such as having a new sexual partner) need to be reassessed to determine if you should start having screenings again. Some women have  medical problems that increase the chance of getting cervical cancer. In these cases, your health care provider may recommend that you have screening and Pap tests more often.  If you have a family history of uterine cancer or ovarian cancer, talk with your health care provider about genetic screening.  If you have vaginal bleeding after reaching menopause, tell your health care provider.  There are currently no reliable tests available to screen for ovarian cancer.  Lung Cancer Lung cancer screening is recommended for adults 66-2 years old who are at high risk for lung cancer because of a history of smoking. A yearly low-dose CT scan of the lungs is recommended if you:  Currently smoke.  Have a history of at least 30 pack-years of smoking and you currently smoke or have quit within the past 15 years. A  pack-year is smoking an average of one pack of cigarettes per day for one year.  Yearly screening should:  Continue until it has been 15 years since you quit.  Stop if you develop a health problem that would prevent you from having lung cancer treatment.  Colorectal Cancer  This type of cancer can be detected and can often be prevented.  Routine colorectal cancer screening usually begins at age 20 and continues through age 57.  If you have risk factors for colon cancer, your health care provider may recommend that you be screened at an earlier age.  If you have a family history of colorectal cancer, talk with your health care provider about genetic screening.  Your health care provider may also recommend using home test kits to check for hidden blood in your stool.  A small camera at the end of a tube can be used to examine your colon directly (sigmoidoscopy or colonoscopy). This is done to check for the earliest forms of colorectal cancer.  Direct examination of the colon should be repeated every 5-10 years until age 64. However, if early forms of precancerous polyps or small  growths are found or if you have a family history or genetic risk for colorectal cancer, you may need to be screened more often.  Skin Cancer  Check your skin from head to toe regularly.  Monitor any moles. Be sure to tell your health care provider: ? About any new moles or changes in moles, especially if there is a change in a mole's shape or color. ? If you have a mole that is larger than the size of a pencil eraser.  If any of your family members has a history of skin cancer, especially at a young age, talk with your health care provider about genetic screening.  Always use sunscreen. Apply sunscreen liberally and repeatedly throughout the day.  Whenever you are outside, protect yourself by wearing long sleeves, pants, a wide-brimmed hat, and sunglasses.  What should I know about osteoporosis? Osteoporosis is a condition in which bone destruction happens more quickly than new bone creation. After menopause, you may be at an increased risk for osteoporosis. To help prevent osteoporosis or the bone fractures that can happen because of osteoporosis, the following is recommended:  If you are 45-36 years old, get at least 1,000 mg of calcium and at least 600 mg of vitamin D per day.  If you are older than age 41 but younger than age 58, get at least 1,200 mg of calcium and at least 600 mg of vitamin D per day.  If you are older than age 83, get at least 1,200 mg of calcium and at least 800 mg of vitamin D per day.  Smoking and excessive alcohol intake increase the risk of osteoporosis. Eat foods that are rich in calcium and vitamin D, and do weight-bearing exercises several times each week as directed by your health care provider. What should I know about how menopause affects my mental health? Depression may occur at any age, but it is more common as you become older. Common symptoms of depression include:  Low or sad mood.  Changes in sleep patterns.  Changes in appetite or eating  patterns.  Feeling an overall lack of motivation or enjoyment of activities that you previously enjoyed.  Frequent crying spells.  Talk with your health care provider if you think that you are experiencing depression. What should I know about immunizations? It is important that you get  and maintain your immunizations. These include:  Tetanus, diphtheria, and pertussis (Tdap) booster vaccine.  Influenza every year before the flu season begins.  Pneumonia vaccine.  Shingles vaccine.  Your health care provider may also recommend other immunizations. This information is not intended to replace advice given to you by your health care provider. Make sure you discuss any questions you have with your health care provider. Document Released: 06/10/2005 Document Revised: 11/06/2015 Document Reviewed: 01/20/2015 Elsevier Interactive Patient Education  2018 [employer].  Hypertension Hypertension, commonly called high blood pressure, is when the force of blood pumping through the arteries is too strong. The arteries are the blood vessels that carry blood from the heart throughout the body. Hypertension forces the heart to work harder to pump blood and may cause arteries to become narrow or stiff. Having untreated or uncontrolled hypertension can cause heart attacks, strokes, kidney disease, and other problems. A blood pressure reading consists of a higher number over a lower number. Ideally, your blood pressure should be below 120/80. The first ("top") number is called the systolic pressure. It is a measure of the pressure in your arteries as your heart beats. The second ("bottom") number is called the diastolic pressure. It is a measure of the pressure in your arteries as the heart relaxes. What are the causes? The cause of this condition is not known. What increases the risk? Some risk factors for high blood pressure are under your control. Others are not. Factors you can  change  Smoking.  Having type 2 diabetes mellitus, high cholesterol, or both.  Not getting enough exercise or physical activity.  Being overweight.  Having too much fat, sugar, calories, or salt (sodium) in your diet.  Drinking too much alcohol. Factors that are difficult or impossible to change  Having chronic kidney disease.  Having a family history of high blood pressure.  Age. Risk increases with age.  Race. You may be at higher risk if you are African-American.  Gender. Men are at higher risk than women before age 39. After age 51, women are at higher risk than men.  Having obstructive sleep apnea.  Stress. What are the signs or symptoms? Extremely high blood pressure (hypertensive crisis) may cause:  Headache.  Anxiety.  Shortness of breath.  Nosebleed.  Nausea and vomiting.  Severe chest pain.  Jerky movements you cannot control (seizures).  How is this diagnosed? This condition is diagnosed by measuring your blood pressure while you are seated, with your arm resting on a surface. The cuff of the blood pressure monitor will be placed directly against the skin of your upper arm at the level of your heart. It should be measured at least twice using the same arm. Certain conditions can cause a difference in blood pressure between your right and left arms. Certain factors can cause blood pressure readings to be lower or higher than normal (elevated) for a short period of time:  When your blood pressure is higher when you are in a health care provider's office than when you are at home, this is called white coat hypertension. Most people with this condition do not need medicines.  When your blood pressure is higher at home than when you are in a health care provider's office, this is called masked hypertension. Most people with this condition may need medicines to control blood pressure.  If you have a high blood pressure reading during one visit or you have  normal blood pressure with other risk factors:  You  may be asked to return on a different day to have your blood pressure checked again.  You may be asked to monitor your blood pressure at home for 1 week or longer.  If you are diagnosed with hypertension, you may have other blood or imaging tests to help your health care provider understand your overall risk for other conditions. How is this treated? This condition is treated by making healthy lifestyle changes, such as eating healthy foods, exercising more, and reducing your alcohol intake. Your health care provider may prescribe medicine if lifestyle changes are not enough to get your blood pressure under control, and if:  Your systolic blood pressure is above 130.  Your diastolic blood pressure is above 80.  Your personal target blood pressure may vary depending on your medical conditions, your age, and other factors. Follow these instructions at home: Eating and drinking  Eat a diet that is high in fiber and potassium, and low in sodium, added sugar, and fat. An example eating plan is called the DASH (Dietary Approaches to Stop Hypertension) diet. To eat this way: ? Eat plenty of fresh fruits and vegetables. Try to fill half of your plate at each meal with fruits and vegetables. ? Eat whole grains, such as whole wheat pasta, brown rice, or whole grain bread. Fill about one quarter of your plate with whole grains. ? Eat or drink low-fat dairy products, such as skim milk or low-fat yogurt. ? Avoid fatty cuts of meat, processed or cured meats, and poultry with skin. Fill about one quarter of your plate with lean proteins, such as fish, chicken without skin, beans, eggs, and tofu. ? Avoid premade and processed foods. These tend to be higher in sodium, added sugar, and fat.  Reduce your daily sodium intake. Most people with hypertension should eat less than 1,500 mg of sodium a day.  Limit alcohol intake to no more than 1 drink a day for  nonpregnant women and 2 drinks a day for men. One drink equals 12 oz of beer, 5 oz of wine, or 1 oz of hard liquor. Lifestyle  Work with your health care provider to maintain a healthy body weight or to lose weight. Ask what an ideal weight is for you.  Get at least 30 minutes of exercise that causes your heart to beat faster (aerobic exercise) most days of the week. Activities may include walking, swimming, or biking.  Include exercise to strengthen your muscles (resistance exercise), such as pilates or lifting weights, as part of your weekly exercise routine. Try to do these types of exercises for 30 minutes at least 3 days a week.  Do not use any products that contain nicotine or tobacco, such as cigarettes and e-cigarettes. If you need help quitting, ask your health care provider.  Monitor your blood pressure at home as told by your health care provider.  Keep all follow-up visits as told by your health care provider. This is important. Medicines  Take over-the-counter and prescription medicines only as told by your health care provider. Follow directions carefully. Blood pressure medicines must be taken as prescribed.  Do not skip doses of blood pressure medicine. Doing this puts you at risk for problems and can make the medicine less effective.  Ask your health care provider about side effects or reactions to medicines that you should watch for. Contact a health care provider if:  You think you are having a reaction to a medicine you are taking.  You have headaches that  keep coming back (recurring).  You feel dizzy.  You have swelling in your ankles.  You have trouble with your vision. Get help right away if:  You develop a severe headache or confusion.  You have unusual weakness or numbness.  You feel faint.  You have severe pain in your chest or abdomen.  You vomit repeatedly.  You have trouble breathing. Summary  Hypertension is when the force of blood pumping  through your arteries is too strong. If this condition is not controlled, it may put you at risk for serious complications.  Your personal target blood pressure may vary depending on your medical conditions, your age, and other factors. For most people, a normal blood pressure is less than 120/80.  Hypertension is treated with lifestyle changes, medicines, or a combination of both. Lifestyle changes include weight loss, eating a healthy, low-sodium diet, exercising more, and limiting alcohol. This information is not intended to replace advice given to you by your health care provider. Make sure you discuss any questions you have with your health care provider. Document Released: 04/18/2005 Document Revised: 03/16/2016 Document Reviewed: 03/16/2016 Elsevier Interactive Patient Education  [employer].

## 2017-11-30 ENCOUNTER — Ambulatory Visit (INDEPENDENT_AMBULATORY_CARE_PROVIDER_SITE_OTHER): Payer: Self-pay | Admitting: Family Medicine

## 2017-11-30 ENCOUNTER — Other Ambulatory Visit: Payer: Self-pay

## 2017-11-30 ENCOUNTER — Encounter: Payer: Self-pay | Admitting: Family Medicine

## 2017-11-30 VITALS — BP 160/80 | HR 57 | Temp 97.7°F | Resp 14 | Ht 64.0 in | Wt 163.0 lb

## 2017-11-30 DIAGNOSIS — IMO0002 Reserved for concepts with insufficient information to code with codable children: Secondary | ICD-10-CM

## 2017-11-30 DIAGNOSIS — E1165 Type 2 diabetes mellitus with hyperglycemia: Secondary | ICD-10-CM

## 2017-11-30 DIAGNOSIS — E118 Type 2 diabetes mellitus with unspecified complications: Secondary | ICD-10-CM

## 2017-11-30 DIAGNOSIS — I214 Non-ST elevation (NSTEMI) myocardial infarction: Secondary | ICD-10-CM

## 2017-11-30 DIAGNOSIS — E782 Mixed hyperlipidemia: Secondary | ICD-10-CM

## 2017-11-30 DIAGNOSIS — I10 Essential (primary) hypertension: Secondary | ICD-10-CM

## 2017-11-30 LAB — POCT URINALYSIS DIPSTICK
Bilirubin, UA: NEGATIVE
Blood, UA: NEGATIVE
Glucose, UA: NEGATIVE
Ketones, UA: NEGATIVE
Leukocytes, UA: NEGATIVE
Nitrite, UA: NEGATIVE
Protein, UA: POSITIVE — AB
Spec Grav, UA: 1.015 (ref 1.010–1.025)
Urobilinogen, UA: 0.2 E.U./dL
pH, UA: 5.5 (ref 5.0–8.0)

## 2017-11-30 LAB — POCT GLYCOSYLATED HEMOGLOBIN (HGB A1C): Hemoglobin A1C: 7.8 % — AB (ref 4.0–5.6)

## 2017-11-30 MED ORDER — LISINOPRIL-HYDROCHLOROTHIAZIDE 20-25 MG PO TABS: 1.0000 | ORAL_TABLET | Freq: Every day | ORAL | 5 refills | Status: DC

## 2017-11-30 MED ORDER — CLONIDINE HCL 0.1 MG PO TABS
0.2000 mg | ORAL_TABLET | Freq: Once | ORAL | Status: AC
  Administered 2017-11-30: 0.2 mg via ORAL

## 2017-11-30 MED ORDER — GLYBURIDE 5 MG PO TABS: 10.0000 mg | ORAL_TABLET | Freq: Every day | ORAL | 5 refills | Status: DC

## 2017-11-30 MED ORDER — METFORMIN HCL 500 MG PO TABS: 500.0000 mg | ORAL_TABLET | Freq: Two times a day (BID) | ORAL | 0 refills | Status: DC

## 2017-11-30 MED ORDER — METOPROLOL TARTRATE 25 MG PO TABS: 25.0000 mg | ORAL_TABLET | Freq: Two times a day (BID) | ORAL | 0 refills | Status: DC

## 2017-11-30 NOTE — Progress Notes (Signed)
Rebecca [name], is a 62 y.o. female  ZOX:[id number]  [id number]:[id number]  DOB - 31-Jul-1955  CC:  Chief Complaint  Patient presents with  . Establish Care  . Hypertension  . Diabetes       HPI: Rebecca [name] is a 62 y.o. female here today to establish medical care.  NSTEMI (non-ST elevated myocardial infarction) due to 2 vessel CAD-s/p DES to mid circumflex.  Patient seen at [facility] 11/06/2017 for skin infection. Noted to have elevated BP of 220/95. Patient was instructed to go to the ED for further evaluation. Patient reports that she did not go.  Patient states that she has had htn since 2005. DM 2 since 2005. Patient states that this is the first day in "a long time" that her systolic has been below 200.  Patient takes a natural water pill that contains alfalfia. "It works just fine".  Not taking zocor or fenofibrate. Hx of NSTEMI with stent.  Patient states that she only eats one healthy meal per day. Consumes most meals at fast food restaurants. Quit smoking in January 2019. Smoked 1 ppd since age 59.  Patient has No headache, No chest pain, No abdominal pain - No Nausea, No new weakness tingling or numbness, No Cough - SOB.   Allergies  Allergen Reactions  . Penicillins Rash   Past Medical History:  Diagnosis Date  . Diabetes mellitus without complication (HCC)   . Hypertension    Current Outpatient Medications on File Prior to Visit  Medication Sig Dispense Refill  . aspirin 81 MG chewable tablet Chew 81 mg by mouth daily.    . BucAlfAspKGlucCouchParsUvaUrJu (NATURAL WATER PILLS) TABS Take 1 tablet by mouth daily.    Marland Kitchen KRILL OIL 1000 MG CAPS Take 1 capsule by mouth daily.    . fenofibrate 160 MG tablet Take 1 tablet (160 mg total) by mouth daily. (Patient not taking: Reported on 11/06/2017) 30 tablet 11  . hydrOXYzine (VISTARIL) 25 MG capsule Take 1 capsule (25 mg total) by mouth 3 (three) times daily as needed for anxiety. (Patient not taking: Reported on 11/06/2017) 30 capsule 0   . omega-3 acid ethyl esters (LOVAZA) 1 G capsule Take 1 capsule (1 g total) by mouth daily. (Patient not taking: Reported on 11/06/2017) 30 capsule 11  . prasugrel (EFFIENT) 10 MG TABS Take 1 tablet (10 mg total) by mouth daily. (Patient not taking: Reported on 11/06/2017) 30 tablet 0  . simvastatin (ZOCOR) 40 MG tablet Take 1 tablet (40 mg total) by mouth daily at 6 PM. (Patient not taking: Reported on 11/30/2017) 30 tablet 0   No current facility-administered medications on file prior to visit.    Family History  Problem Relation Age of Onset  . Diabetes Brother   . Heart disease Brother   . Arthritis Brother   . Diabetes Father   . Heart disease Father    Social History   Socioeconomic History  . Marital status: Single    Spouse name: Not on file  . Number of children: Not on file  . Years of education: Not on file  . Highest education level: Not on file  Occupational History  . Not on file  Social Needs  . Financial resource strain: Not on file  . Food insecurity:    Worry: Not on file    Inability: Not on file  . Transportation needs:    Medical: Not on file    Non-medical: Not on file  Tobacco Use  . Smoking status:  Former Smoker    Packs/day: 1.00    Types: Cigarettes  . Smokeless tobacco: Never Used  Substance and Sexual Activity  . Alcohol use: Yes    Comment: occ  . Drug use: No  . Sexual activity: Not on file  Lifestyle  . Physical activity:    Days per week: Not on file    Minutes per session: Not on file  . Stress: Not on file  Relationships  . Social connections:    Talks on phone: Not on file    Gets together: Not on file    Attends religious service: Not on file    Active member of club or organization: Not on file    Attends meetings of clubs or organizations: Not on file    Relationship status: Not on file  . Intimate partner violence:    Fear of current or ex partner: Not on file    Emotionally abused: Not on file    Physically abused: Not on  file    Forced sexual activity: Not on file  Other Topics Concern  . Not on file  Social History Narrative  . Not on file    Review of Systems  Constitutional: Negative.   HENT: Negative.   Eyes: Negative.   Respiratory: Negative.   Cardiovascular: Positive for leg swelling.  Gastrointestinal: Negative.   Genitourinary: Negative.   Musculoskeletal: Negative.   Skin: Negative.   Neurological: Negative.   Psychiatric/Behavioral: Negative.       Objective:   Vitals:   11/30/17 1310 11/30/17 1413  BP: (!) 184/88 (!) 160/80  Pulse: (!) 57   Resp: 14   Temp: 97.7 F (36.5 C)   SpO2: 97%     Physical Exam  Constitutional: She is oriented to person, place, and time and well-developed, well-nourished, and in no distress. No distress.  HENT:  Head: Normocephalic and atraumatic.  Eyes: Pupils are equal, round, and reactive to light. Conjunctivae and EOM are normal.  Neck: Normal range of motion. Neck supple.  Cardiovascular: Normal rate, regular rhythm and intact distal pulses. Exam reveals no gallop and no friction rub.  No murmur heard. Pulmonary/Chest: Effort normal and breath sounds normal. No respiratory distress. She has no wheezes.  Abdominal: Soft. Bowel sounds are normal. There is no tenderness.  Musculoskeletal: Normal range of motion. She exhibits edema (1+ pitting bilateral lower). She exhibits no tenderness.  Lymphadenopathy:    She has no cervical adenopathy.  Neurological: She is alert and oriented to person, place, and time. Gait normal.  Skin: Skin is warm and dry.  Psychiatric: Mood, memory, affect and judgment normal.  Nursing note and vitals reviewed.    Lab Results  Component Value Date   WBC 5.9 11/15/2012   HGB 15.3 (H) 01/20/2015   HCT 45.0 01/20/2015   MCV 89.0 11/15/2012   PLT 160 11/15/2012   Lab Results  Component Value Date   CREATININE 0.80 01/20/2015   BUN 13 01/20/2015   NA 136 01/20/2015   K 4.0 01/20/2015   CL 99 (L)  01/20/2015   CO2 25 11/15/2012    Lab Results  Component Value Date   HGBA1C 7.8 (A) 11/30/2017   Lipid Panel     Component Value Date/Time   CHOL 336 (H) 11/14/2012 0335   TRIG 2,223 (H) 11/14/2012 0335   HDL NOT REPORTED DUE TO HIGH TRIGLYCERIDES 11/14/2012 0335   CHOLHDL NOT REPORTED DUE TO HIGH TRIGLYCERIDES 11/14/2012 0335   VLDL UNABLE TO CALCULATE IF  TRIGLYCERIDE OVER 400 mg/dL 14/78/295607/16/2014 [id number]   LDLCALC UNABLE TO CALCULATE IF TRIGLYCERIDE OVER 400 mg/dL 86/57/846907/16/2014 [id number]        Assessment and plan:  1. Hypertension, unspecified type Clonidine 0.2 given to patient in office.  - Urinalysis Dipstick - metoprolol tartrate (LOPRESSOR) 25 MG tablet; Take 1 tablet (25 mg total) by mouth 2 (two) times daily.  Dispense: 60 tablet; Refill: 0 - lisinopril-hydrochlorothiazide (PRINZIDE,ZESTORETIC) 20-25 MG tablet; Take 1 tablet by mouth daily.  Dispense: 30 tablet; Refill: 5 - cloNIDine (CATAPRES) tablet 0.2 mg - Ambulatory referral to Cardiology  2. DM (diabetes mellitus), type 2, uncontrolled with complications (HCC)  - HgB A1c - glyBURIDE (DIABETA) 5 MG tablet; Take 2 tablets (10 mg total) by mouth daily with breakfast.  Dispense: 60 tablet; Refill: 5 - EKG 12-Lead - metFORMIN (GLUCOPHAGE) 500 MG tablet; Take 1 tablet (500 mg total) by mouth 2 (two) times daily with a meal.  Dispense: 60 tablet; Refill: 0 - CBC with Differential - Comprehensive metabolic panel - TSH - Lipid Panel With LDL/HDL Ratio; Future  3. Mixed hyperlipidemia Patient is non compliant due to muscle pain. Instructed to take medications QOD.  - CBC with Differential - Comprehensive metabolic panel - Lipid Panel With LDL/HDL Ratio; Future - Ambulatory referral to Cardiology  4. NSTEMI (non-ST elevated myocardial infarction) [facility](HCC)  - Ambulatory referral to Cardiology   Return in about 2 weeks (around 12/14/2017) for HTN.  The patient was given clear instructions to go to ER or return to medical center  if symptoms don't improve, worsen or new problems develop. The patient verbalized understanding. The patient was told to call to get lab results if they haven't heard anything in the next week.     This note has been created with Education officer, environmentalDragon speech recognition software and smart phrase technology. Any transcriptional errors are unintentional.   Ms. Andr L. Riley Lamouglas, FNP-BC [facility] [address]  [place], [place] [id number] [phone]

## 2017-11-30 NOTE — Patient Instructions (Addendum)
I increased your lisinopril to 20mg  daily.    Managing Your Hypertension Hypertension is commonly called high blood pressure. This is when the force of your blood pressing against the walls of your arteries is too strong. Arteries are blood vessels that carry blood from your heart throughout your body. Hypertension forces the heart to work harder to pump blood, and may cause the arteries to become narrow or stiff. Having untreated or uncontrolled hypertension can cause heart attack, stroke, kidney disease, and other problems. What are blood pressure readings? A blood pressure reading consists of a higher number over a lower number. Ideally, your blood pressure should be below 120/80. The first ("top") number is called the systolic pressure. It is a measure of the pressure in your arteries as your heart beats. The second ("bottom") number is called the diastolic pressure. It is a measure of the pressure in your arteries as the heart relaxes. What does my blood pressure reading mean? Blood pressure is classified into four stages. Based on your blood pressure reading, your health care provider may use the following stages to determine what type of treatment you need, if any. Systolic pressure and diastolic pressure are measured in a unit called mm Hg. Normal  Systolic pressure: below 120.  Diastolic pressure: below 80. Elevated  Systolic pressure: 120-129.  Diastolic pressure: below 80. Hypertension stage 1  Systolic pressure: 130-139.  Diastolic pressure: 80-89. Hypertension stage 2  Systolic pressure: 140 or above.  Diastolic pressure: 90 or above. What health risks are associated with hypertension? Managing your hypertension is an important responsibility. Uncontrolled hypertension can lead to:  A heart attack.  A stroke.  A weakened blood vessel (aneurysm).  Heart failure.  Kidney damage.  Eye damage.  Metabolic syndrome.  Memory and concentration problems.  What  changes can I make to manage my hypertension? Hypertension can be managed by making lifestyle changes and possibly by taking medicines. Your health care provider will help you make a plan to bring your blood pressure within a normal range. Eating and drinking  Eat a diet that is high in fiber and potassium, and low in salt (sodium), added sugar, and fat. An example eating plan is called the DASH (Dietary Approaches to Stop Hypertension) diet. To eat this way: ? Eat plenty of fresh fruits and vegetables. Try to fill half of your plate at each meal with fruits and vegetables. ? Eat whole grains, such as whole wheat pasta, brown rice, or whole grain bread. Fill about one quarter of your plate with whole grains. ? Eat low-fat diary products. ? Avoid fatty cuts of meat, processed or cured meats, and poultry with skin. Fill about one quarter of your plate with lean proteins such as fish, chicken without skin, beans, eggs, and tofu. ? Avoid premade and processed foods. These tend to be higher in sodium, added sugar, and fat.  Reduce your daily sodium intake. Most people with hypertension should eat less than 1,500 mg of sodium a day.  Limit alcohol intake to no more than 1 drink a day for nonpregnant women and 2 drinks a day for men. One drink equals 12 oz of beer, 5 oz of wine, or 1 oz of hard liquor. Lifestyle  Work with your health care provider to maintain a healthy body weight, or to lose weight. Ask what an ideal weight is for you.  Get at least 30 minutes of exercise that causes your heart to beat faster (aerobic exercise) most days of the week.  Activities may include walking, swimming, or biking.  Include exercise to strengthen your muscles (resistance exercise), such as weight lifting, as part of your weekly exercise routine. Try to do these types of exercises for 30 minutes at least 3 days a week.  Do not use any products that contain nicotine or tobacco, such as cigarettes and  e-cigarettes. If you need help quitting, ask your health care provider.  Control any long-term (chronic) conditions you have, such as high cholesterol or diabetes. Monitoring  Monitor your blood pressure at home as told by your health care provider. Your personal target blood pressure may vary depending on your medical conditions, your age, and other factors.  Have your blood pressure checked regularly, as often as told by your health care provider. Working with your health care provider  Review all the medicines you take with your health care provider because there may be side effects or interactions.  Talk with your health care provider about your diet, exercise habits, and other lifestyle factors that may be contributing to hypertension.  Visit your health care provider regularly. Your health care provider can help you create and adjust your plan for managing hypertension. Will I need medicine to control my blood pressure? Your health care provider may prescribe medicine if lifestyle changes are not enough to get your blood pressure under control, and if:  Your systolic blood pressure is 130 or higher.  Your diastolic blood pressure is 80 or higher.  Take medicines only as told by your health care provider. Follow the directions carefully. Blood pressure medicines must be taken as prescribed. The medicine does not work as well when you skip doses. Skipping doses also puts you at risk for problems. Contact a health care provider if:  You think you are having a reaction to medicines you have taken.  You have repeated (recurrent) headaches.  You feel dizzy.  You have swelling in your ankles.  You have trouble with your vision. Get help right away if:  You develop a severe headache or confusion.  You have unusual weakness or numbness, or you feel faint.  You have severe pain in your chest or abdomen.  You vomit repeatedly.  You have trouble  breathing. Summary  Hypertension is when the force of blood pumping through your arteries is too strong. If this condition is not controlled, it may put you at risk for serious complications.  Your personal target blood pressure may vary depending on your medical conditions, your age, and other factors. For most people, a normal blood pressure is less than 120/80.  Hypertension is managed by lifestyle changes, medicines, or both. Lifestyle changes include weight loss, eating a healthy, low-sodium diet, exercising more, and limiting alcohol. This information is not intended to replace advice given to you by your health care provider. Make sure you discuss any questions you have with your health care provider. Document Released: 01/11/2012 Document Revised: 03/16/2016 Document Reviewed: 03/16/2016 Elsevier Interactive Patient Education  [employer].

## 2017-12-01 LAB — COMPREHENSIVE METABOLIC PANEL
ALT: 14 IU/L (ref 0–32)
AST: 16 IU/L (ref 0–40)
Albumin/Globulin Ratio: 1.4 (ref 1.2–2.2)
Albumin: 3.9 g/dL (ref 3.6–4.8)
Alkaline Phosphatase: 73 IU/L (ref 39–117)
BUN/Creatinine Ratio: 13 (ref 12–28)
BUN: 13 mg/dL (ref 8–27)
Bilirubin Total: 0.5 mg/dL (ref 0.0–1.2)
CO2: 24 mmol/L (ref 20–29)
Calcium: 8.7 mg/dL (ref 8.7–10.3)
Chloride: 100 mmol/L (ref 96–106)
Creatinine, Ser: 0.97 mg/dL (ref 0.57–1.00)
GFR calc Af Amer: 73 mL/min/{1.73_m2} (ref >59)
GFR calc non Af Amer: 64 mL/min/{1.73_m2} (ref >59)
Globulin, Total: 2.7 g/dL (ref 1.5–4.5)
Glucose: 176 mg/dL — ABNORMAL HIGH (ref 65–99)
Potassium: 3.8 mmol/L (ref 3.5–5.2)
Sodium: 142 mmol/L (ref 134–144)
Total Protein: 6.6 g/dL (ref 6.0–8.5)

## 2017-12-01 LAB — CBC WITH DIFFERENTIAL/PLATELET
Basophils Absolute: 0 10*3/uL (ref 0.0–0.2)
Basos: 0 %
EOS (ABSOLUTE): 0.1 10*3/uL (ref 0.0–0.4)
Eos: 2 %
Hematocrit: 36.7 % (ref 34.0–46.6)
Hemoglobin: 11.9 g/dL (ref 11.1–15.9)
Immature Grans (Abs): 0 10*3/uL (ref 0.0–0.1)
Immature Granulocytes: 0 %
Lymphocytes Absolute: 1.4 10*3/uL (ref 0.7–3.1)
Lymphs: 27 %
MCH: 29.8 pg (ref 26.6–33.0)
MCHC: 32.4 g/dL (ref 31.5–35.7)
MCV: 92 fL (ref 79–97)
Monocytes Absolute: 0.3 10*3/uL (ref 0.1–0.9)
Monocytes: 6 %
Neutrophils Absolute: 3.5 10*3/uL (ref 1.4–7.0)
Neutrophils: 65 %
Platelets: 190 10*3/uL (ref 150–450)
RBC: 3.99 x10E6/uL (ref 3.77–5.28)
RDW: 16.5 % — ABNORMAL HIGH (ref 12.3–15.4)
WBC: 5.3 10*3/uL (ref 3.4–10.8)

## 2017-12-01 LAB — TSH: TSH: 1.19 u[IU]/mL (ref 0.450–4.500)

## 2017-12-04 NOTE — Progress Notes (Signed)
The  labs are stable without significant clinical change.  All other results are normal or within acceptable limits. No Medication changes  I would like her to make sure that she is well hydrated. She does have protein in her urine. Will repeat UA at her next visit.

## 2017-12-14 ENCOUNTER — Ambulatory Visit (INDEPENDENT_AMBULATORY_CARE_PROVIDER_SITE_OTHER): Payer: Self-pay | Admitting: Family Medicine

## 2017-12-14 ENCOUNTER — Encounter: Payer: Self-pay | Admitting: Family Medicine

## 2017-12-14 VITALS — BP 162/88 | HR 48 | Temp 97.9°F | Resp 16 | Ht 64.0 in | Wt 159.0 lb

## 2017-12-14 DIAGNOSIS — I10 Essential (primary) hypertension: Secondary | ICD-10-CM

## 2017-12-14 DIAGNOSIS — Z9114 Patient's other noncompliance with medication regimen: Secondary | ICD-10-CM

## 2017-12-14 MED ORDER — LISINOPRIL-HYDROCHLOROTHIAZIDE 20-25 MG PO TABS: 1.0000 | ORAL_TABLET | Freq: Every day | ORAL | 5 refills | Status: DC

## 2017-12-14 MED ORDER — METOPROLOL TARTRATE 25 MG PO TABS: 25.0000 mg | ORAL_TABLET | Freq: Two times a day (BID) | ORAL | 0 refills | Status: DC

## 2017-12-14 MED ORDER — METOPROLOL TARTRATE 25 MG PO TABS: 25.0000 mg | ORAL_TABLET | Freq: Two times a day (BID) | ORAL | 5 refills | Status: DC

## 2017-12-14 NOTE — Patient Instructions (Addendum)
Mediterranean Diet A Mediterranean diet refers to food and lifestyle choices that are based on the traditions of countries located on the [place]. This way of eating has been shown to help prevent certain conditions and improve outcomes for people who have chronic diseases, like kidney disease and heart disease. What are tips for following this plan? Lifestyle  Cook and eat meals together with your family, when possible.  Drink enough fluid to keep your urine clear or pale yellow.  Be physically active every day. This includes: ? Aerobic exercise like running or swimming. ? Leisure activities like gardening, walking, or housework.  Get 7-8 hours of sleep each night.  If recommended by your health care provider, drink red wine in moderation. This means 1 glass a day for nonpregnant women and 2 glasses a day for men. A glass of wine equals 5 oz (150 mL). Reading food labels  Check the serving size of packaged foods. For foods such as rice and pasta, the serving size refers to the amount of cooked product, not dry.  Check the total fat in packaged foods. Avoid foods that have saturated fat or trans fats.  Check the ingredients list for added sugars, such as corn syrup. Shopping  At the grocery store, buy most of your food from the areas near the walls of the store. This includes: ? Fresh fruits and vegetables (produce). ? Grains, beans, nuts, and seeds. Some of these may be available in unpackaged forms or large amounts (in bulk). ? Fresh seafood. ? Poultry and eggs. ? Low-fat dairy products.  Buy whole ingredients instead of prepackaged foods.  Buy fresh fruits and vegetables in-season from local farmers markets.  Buy frozen fruits and vegetables in resealable bags.  If you do not have access to quality fresh seafood, buy precooked frozen shrimp or canned fish, such as tuna, salmon, or sardines.  Buy small amounts of raw or cooked vegetables, salads, or olives from the  deli or salad bar at your store.  Stock your pantry so you always have certain foods on hand, such as olive oil, canned tuna, canned tomatoes, rice, pasta, and beans. Cooking  Cook foods with extra-virgin olive oil instead of using butter or other vegetable oils.  Have meat as a side dish, and have vegetables or grains as your main dish. This means having meat in small portions or adding small amounts of meat to foods like pasta or stew.  Use beans or vegetables instead of meat in common dishes like chili or lasagna.  Experiment with different cooking methods. Try roasting or broiling vegetables instead of steaming or sauteing them.  Add frozen vegetables to soups, stews, pasta, or rice.  Add nuts or seeds for added healthy fat at each meal. You can add these to yogurt, salads, or vegetable dishes.  Marinate fish or vegetables using olive oil, lemon juice, garlic, and fresh herbs. Meal planning  Plan to eat 1 vegetarian meal one day each week. Try to work up to 2 vegetarian meals, if possible.  Eat seafood 2 or more times a week.  Have healthy snacks readily available, such as: ? Vegetable sticks with hummus. ? Greek yogurt. ? Fruit and nut trail mix.  Eat balanced meals throughout the week. This includes: ? Fruit: 2-3 servings a day ? Vegetables: 4-5 servings a day ? Low-fat dairy: 2 servings a day ? Fish, poultry, or lean meat: 1 serving a day ? Beans and legumes: 2 or more servings a week ? Nuts   and seeds: 1-2 servings a day ? Whole grains: 6-8 servings a day ? Extra-virgin olive oil: 3-4 servings a day  Limit red meat and sweets to only a few servings a month What are my food choices?  Mediterranean diet ? Recommended ? Grains: Whole-grain pasta. Brown rice. Bulgar wheat. Polenta. Couscous. Whole-wheat bread. Orpah Cobbatmeal. Quinoa. ? Vegetables: Artichokes. Beets. Broccoli. Cabbage. Carrots. Eggplant. Green beans. Chard. Kale. Spinach. Onions. Leeks. Peas. Squash.  Tomatoes. Peppers. Radishes. ? Fruits: Apples. Apricots. Avocado. Berries. Bananas. Cherries. Dates. Figs. Grapes. Lemons. Melon. Oranges. Peaches. Plums. Pomegranate. ? Meats and other protein foods: Beans. Almonds. Sunflower seeds. Pine nuts. Peanuts. Cod. Salmon. Scallops. Shrimp. Tuna. Tilapia. Clams. Oysters. Eggs. ? Dairy: Low-fat milk. Cheese. Greek yogurt. ? Beverages: Water. Red wine. Herbal tea. ? Fats and oils: Extra virgin olive oil. Avocado oil. Grape seed oil. ? Sweets and desserts: [place] yogurt with honey. Baked apples. Poached pears. Trail mix. ? Seasoning and other foods: Basil. Cilantro. Coriander. Cumin. Mint. Parsley. Sage. Rosemary. Tarragon. Garlic. Oregano. Thyme. Pepper. Balsalmic vinegar. Tahini. Hummus. Tomato sauce. Olives. Mushrooms. ? Limit these ? Grains: Prepackaged pasta or rice dishes. Prepackaged cereal with added sugar. ? Vegetables: Deep fried potatoes (french fries). ? Fruits: Fruit canned in syrup. ? Meats and other protein foods: Beef. Pork. Lamb. Poultry with skin. Hot dogs. Tomasa BlaseBacon. ? Dairy: Ice cream. Sour cream. Whole milk. ? Beverages: Juice. Sugar-sweetened soft drinks. Beer. Liquor and spirits. ? Fats and oils: Butter. Canola oil. Vegetable oil. Beef fat (tallow). Lard. ? Sweets and desserts: Cookies. Cakes. Pies. Candy. ? Seasoning and other foods: Mayonnaise. Premade sauces and marinades. ? The items listed may not be a complete list. Talk with your dietitian about what dietary choices are right for you. Summary  The Mediterranean diet includes both food and lifestyle choices.  Eat a variety of fresh fruits and vegetables, beans, nuts, seeds, and whole grains.  Limit the amount of red meat and sweets that you eat.  Talk with your health care provider about whether it is safe for you to drink red wine in moderation. This means 1 glass a day for nonpregnant women and 2 glasses a day for men. A glass of wine equals 5 oz (150 mL). This information  is not intended to replace advice given to you by your health care provider. Make sure you discuss any questions you have with your health care provider. Document Released: 12/10/2015 Document Revised: 01/12/2016 Document Reviewed: 12/10/2015 Elsevier Interactive Patient Education  2018 [employer] Hypertension Hypertension, commonly called high blood pressure, is when the force of blood pumping through the arteries is too strong. The arteries are the blood vessels that carry blood from the heart throughout the body. Hypertension forces the heart to work harder to pump blood and may cause arteries to become narrow or stiff. Having untreated or uncontrolled hypertension can cause heart attacks, strokes, kidney disease, and other problems. A blood pressure reading consists of a higher number over a lower number. Ideally, your blood pressure should be below 120/80. The first ("top") number is called the systolic pressure. It is a measure of the pressure in your arteries as your heart beats. The second ("bottom") number is called the diastolic pressure. It is a measure of the pressure in your arteries as the heart relaxes. What are the causes? The cause of this condition is not known. What increases the risk? Some risk factors for high blood pressure are under your control. Others are not. Factors you can change  Smoking.  Having type 2 diabetes mellitus, high cholesterol, or both.  Not getting enough exercise or physical activity.  Being overweight.  Having too much fat, sugar, calories, or salt (sodium) in your diet.  Drinking too much alcohol. Factors that are difficult or impossible to change  Having chronic kidney disease.  Having a family history of high blood pressure.  Age. Risk increases with age.  Race. You may be at higher risk if you are African-American.  Gender. Men are at higher risk than women before age 61. After age 61, women are at higher risk than men.  Having  obstructive sleep apnea.  Stress. What are the signs or symptoms? Extremely high blood pressure (hypertensive crisis) may cause:  Headache.  Anxiety.  Shortness of breath.  Nosebleed.  Nausea and vomiting.  Severe chest pain.  Jerky movements you cannot control (seizures).  How is this diagnosed? This condition is diagnosed by measuring your blood pressure while you are seated, with your arm resting on a surface. The cuff of the blood pressure monitor will be placed directly against the skin of your upper arm at the level of your heart. It should be measured at least twice using the same arm. Certain conditions can cause a difference in blood pressure between your right and left arms. Certain factors can cause blood pressure readings to be lower or higher than normal (elevated) for a short period of time:  When your blood pressure is higher when you are in a health care provider's office than when you are at home, this is called white coat hypertension. Most people with this condition do not need medicines.  When your blood pressure is higher at home than when you are in a health care provider's office, this is called masked hypertension. Most people with this condition may need medicines to control blood pressure.  If you have a high blood pressure reading during one visit or you have normal blood pressure with other risk factors:  You may be asked to return on a different day to have your blood pressure checked again.  You may be asked to monitor your blood pressure at home for 1 week or longer.  If you are diagnosed with hypertension, you may have other blood or imaging tests to help your health care provider understand your overall risk for other conditions. How is this treated? This condition is treated by making healthy lifestyle changes, such as eating healthy foods, exercising more, and reducing your alcohol intake. Your health care provider may prescribe medicine if  lifestyle changes are not enough to get your blood pressure under control, and if:  Your systolic blood pressure is above 130.  Your diastolic blood pressure is above 80.  Your personal target blood pressure may vary depending on your medical conditions, your age, and other factors. Follow these instructions at home: Eating and drinking  Eat a diet that is high in fiber and potassium, and low in sodium, added sugar, and fat. An example eating plan is called the DASH (Dietary Approaches to Stop Hypertension) diet. To eat this way: ? Eat plenty of fresh fruits and vegetables. Try to fill half of your plate at each meal with fruits and vegetables. ? Eat whole grains, such as whole wheat pasta, brown rice, or whole grain bread. Fill about one quarter of your plate with whole grains. ? Eat or drink low-fat dairy products, such as skim milk or low-fat yogurt. ? Avoid fatty cuts of meat, processed or cured  meats, and poultry with skin. Fill about one quarter of your plate with lean proteins, such as fish, chicken without skin, beans, eggs, and tofu. ? Avoid premade and processed foods. These tend to be higher in sodium, added sugar, and fat.  Reduce your daily sodium intake. Most people with hypertension should eat less than 1,500 mg of sodium a day.  Limit alcohol intake to no more than 1 drink a day for nonpregnant women and 2 drinks a day for men. One drink equals 12 oz of beer, 5 oz of wine, or 1 oz of hard liquor. Lifestyle  Work with your health care provider to maintain a healthy body weight or to lose weight. Ask what an ideal weight is for you.  Get at least 30 minutes of exercise that causes your heart to beat faster (aerobic exercise) most days of the week. Activities may include walking, swimming, or biking.  Include exercise to strengthen your muscles (resistance exercise), such as pilates or lifting weights, as part of your weekly exercise routine. Try to do these types of exercises  for 30 minutes at least 3 days a week.  Do not use any products that contain nicotine or tobacco, such as cigarettes and e-cigarettes. If you need help quitting, ask your health care provider.  Monitor your blood pressure at home as told by your health care provider.  Keep all follow-up visits as told by your health care provider. This is important. Medicines  Take over-the-counter and prescription medicines only as told by your health care provider. Follow directions carefully. Blood pressure medicines must be taken as prescribed.  Do not skip doses of blood pressure medicine. Doing this puts you at risk for problems and can make the medicine less effective.  Ask your health care provider about side effects or reactions to medicines that you should watch for. Contact a health care provider if:  You think you are having a reaction to a medicine you are taking.  You have headaches that keep coming back (recurring).  You feel dizzy.  You have swelling in your ankles.  You have trouble with your vision. Get help right away if:  You develop a severe headache or confusion.  You have unusual weakness or numbness.  You feel faint.  You have severe pain in your chest or abdomen.  You vomit repeatedly.  You have trouble breathing. Summary  Hypertension is when the force of blood pumping through your arteries is too strong. If this condition is not controlled, it may put you at risk for serious complications.  Your personal target blood pressure may vary depending on your medical conditions, your age, and other factors. For most people, a normal blood pressure is less than 120/80.  Hypertension is treated with lifestyle changes, medicines, or a combination of both. Lifestyle changes include weight loss, eating a healthy, low-sodium diet, exercising more, and limiting alcohol. This information is not intended to replace advice given to you by your health care provider. Make sure you  discuss any questions you have with your health care provider. Document Released: 04/18/2005 Document Revised: 03/16/2016 Document Reviewed: 03/16/2016 Elsevier Interactive Patient Education  [employer]. The 2018 Physical Activity Guidelines recommend the equivalent of 150 minutes per week of moderate to vigorous aerobic activity each week, with muscle-strengthening activities on two days during the week    PRESCRIPTION FOR EXERCISE  Frequency Four to five days per week  Intensity Moderate- During moderate intensity exercise, a person is too winded to  sing but is not so winded they cannot talk  Time 30 minutes or a total of 150 minutes per week.   Type Brisk walking PLUS One set each of: 0 Body weight squats  10 0 Plank hold for 30 seconds   Basic bodyweight exercise guide: Squat and plank  (A) Body weight squat: 0 The squat develops strength in the legs and torso. Stand with your feet about shoulder's width apart and slightly externally rotated. Maintain your weight on your heels or midfoot throughout the exercise. Keep your lower back flat; do not allow it to round or to extend excessively. Your knees should remain aligned with your ankles and legs throughout the motion (knees caving inward towards one another is a common problem). Descend until your hips come just below the level of your knees. If strength or mobility limitations prevent you from reaching this depth, begin with partial squats and gradually increase the depth over time. Once you can perform 15 squats with proper technique and full depth, make the exercise more challenging by holding a weight. (B) Standard plank: 0 The basic plank develops torso (core) strength. Maintain a straight torso, not allowing your lower back to sag or your hips to elevate, throughout the exercise. Gradually increase the time you can hold the position.   Left Bundle Branch Block Left bundle branch block (LBBB) is a problem with the way  that electrical impulses pass through the heart (electrical conduction abnormality). The heart depends on an electrical pulse to beat normally. The electrical signal for a heartbeat starts in the upper chambers of the heart (atria) and then travels to the two lower chambers (left and rightventricles). An LBBB is a partial or complete block of the pathway that carries the signal to the left ventricle. If you have LBBB, the left side of your heart does not beat normally. LBBB may be a warning sign of heart disease. What are the causes? This condition may be caused by:  Heart disease.  Disease of the arteries in the heart (arteriosclerosis).  Stiffening or weakening of heart muscle (cardiomyopathy).  Infection of heart muscle (myocarditis).  High blood pressure (hypertension).  In some cases, the cause may not be known. What increases the risk? The following factors may make you more likely to develop this condition:  Being female.  Being 40 years of age or older.  Having heart disease.  Having had a heart attack or heart surgery.  What are the signs or symptoms? This condition may not cause any symptoms. If you do have symptoms, they may include:  Feeling dizzy or light-headed.  Fainting.  How is this diagnosed? This condition may be diagnosed based on an electrocardiogram (ECG). It is often diagnosed when an ECG is done as part of a routine physical or to help find the cause of fainting spells. You may also have imaging tests to find out more about your condition. These may include:  Chest X-rays.  Echo test (echocardiogram).  How is this treated? If you do not have symptoms or any other type of heart disease, you may not need treatment for this condition. However, you may need to see your health care provider more often because LBBB can be a warning sign of future heart problems. You may get treatment for other heart problems or high blood pressure. If LBBB causes symptoms  or other heart problems, you may need to have an electrical device (pacemaker) implanted under the skin of your chest. A pacemaker sends electrical  signals to your heart to keep it beating normally. Follow these instructions at home:  Follow instructions from your health care provider about eating or drinking restrictions.  Take over-the-counter and prescription medicines only as told by your health care provider.  Return to your normal activities as told by your health care provider. Ask your health care provider what activities are safe for you.  Follow a heart-healthy diet and maintain a healthy weight. Work with a diet and nutrition specialist (dietitian) to create an eating plan that is best for you.  Get regular exercise as told by your health care provider.  Do not use any products that contain nicotine or tobacco, such as cigarettes and e-cigarettes. If you need help quitting, ask your health care provider.  Keep all follow-up visits as told by your health care provider. This is important. Contact a health care provider if:  You are light-headed or you faint. Get help right away if:  You have chest pain.  You have difficulty breathing. These symptoms may represent a serious problem that is an emergency. Do not wait to see if the symptoms will go away. Get medical help right away. Call your local emergency services (911 in the [place] Do not drive yourself to the hospital. Summary  For the heart to beat normally, an electrical signal must travel to the lower left chamber of the heart (left ventricle). Left bundle branch block (LBBB) is a partial or complete block of the pathway that carries that signal.  This condition may not cause any symptoms. In some cases, a person may feel dizzy or light-headed or may faint.  Treatment may not be needed for LBBB if you do not have symptoms or any other type of heart disease.  You may need to see your health care provider more often  because LBBB can be a warning sign of future heart problems. This information is not intended to replace advice given to you by your health care provider. Make sure you discuss any questions you have with your health care provider. Document Released: 06/07/2016 Document Revised: 06/07/2016 Document Reviewed: 06/07/2016 Elsevier Interactive Patient Education  2018 [employer].

## 2017-12-14 NOTE — Progress Notes (Signed)
[facility]  Provider: Mike Gip, FNP   Hypertension Follow Up Visit  SUBJECTIVE:  Rebecca [name] is a 62 y.o. female with hypertension. Patient states that she did not take the lisinopril this morning. She ran out. Patient with an appointment January 08, 2018 with cardiology.   Patient reports 24 hour diet recall: cereal bar, salad with chicken and walnuts with onion vinegrette, chicken wings and potato salad. Drinks: mountain dew, coffee (50/50). Water.   Patient states that she does walk for 30 minutes for a few times a week. Not strenuous.   Current Outpatient Medications  Medication Sig Dispense Refill  . aspirin 81 MG chewable tablet Chew 81 mg by mouth daily.    . BucAlfAspKGlucCouchParsUvaUrJu (NATURAL WATER PILLS) TABS Take 1 tablet by mouth daily.    Marland Kitchen glyBURIDE (DIABETA) 5 MG tablet Take 2 tablets (10 mg total) by mouth daily with breakfast. 60 tablet 5  . KRILL OIL 1000 MG CAPS Take 1 capsule by mouth daily.    Marland Kitchen lisinopril-hydrochlorothiazide (PRINZIDE,ZESTORETIC) 20-25 MG tablet Take 1 tablet by mouth daily. 30 tablet 5  . metFORMIN (GLUCOPHAGE) 500 MG tablet Take 1 tablet (500 mg total) by mouth 2 (two) times daily with a meal. 60 tablet 0  . metoprolol tartrate (LOPRESSOR) 25 MG tablet Take 1 tablet (25 mg total) by mouth 2 (two) times daily. 60 tablet 0  . fenofibrate 160 MG tablet Take 1 tablet (160 mg total) by mouth daily. (Patient not taking: Reported on 11/06/2017) 30 tablet 11  . hydrOXYzine (VISTARIL) 25 MG capsule Take 1 capsule (25 mg total) by mouth 3 (three) times daily as needed for anxiety. (Patient not taking: Reported on 11/06/2017) 30 capsule 0  . omega-3 acid ethyl esters (LOVAZA) 1 G capsule Take 1 capsule (1 g total) by mouth daily. (Patient not taking: Reported on 11/06/2017) 30 capsule 11  . prasugrel (EFFIENT) 10 MG TABS Take 1 tablet (10 mg total) by mouth daily. (Patient not taking: Reported on  11/06/2017) 30 tablet 0  . simvastatin (ZOCOR) 40 MG tablet Take 1 tablet (40 mg total) by mouth daily at 6 PM. (Patient not taking: Reported on 11/30/2017) 30 tablet 0   No current facility-administered medications for this visit.     Recent Results (from the past 2160 hour(s))  HgB A1c     Status: Abnormal   Collection Time: 11/30/17  1:29 PM  Result Value Ref Range   Hemoglobin A1C 7.8 (A) 4.0 - 5.6 %   HbA1c POC (<> result, manual entry)  4.0 - 5.6 %   HbA1c, POC (prediabetic range)  5.7 - 6.4 %   HbA1c, POC (controlled diabetic range)  0.0 - 7.0 %  Urinalysis Dipstick     Status: Abnormal   Collection Time: 11/30/17  1:30 PM  Result Value Ref Range   Color, UA     Clarity, UA     Glucose, UA Negative Negative   Bilirubin, UA neg    Ketones, UA neg    Spec Grav, UA 1.015 1.010 - 1.025   Blood, UA neg    pH, UA 5.5 5.0 - 8.0   Protein, UA Positive (A) Negative    Comment: 100   Urobilinogen, UA 0.2 0.2 or 1.0 E.U./dL   Nitrite, UA neg    Leukocytes, UA Negative Negative   Appearance     Odor    CBC with Differential     Status: Abnormal  Collection Time: 11/30/17  1:51 PM  Result Value Ref Range   WBC 5.3 3.4 - 10.8 x10E3/uL   RBC 3.99 3.77 - 5.28 x10E6/uL   Hemoglobin 11.9 11.1 - 15.9 g/dL   Hematocrit 16.136.7 09.634.0 - 46.6 %   MCV 92 79 - 97 fL   MCH 29.8 26.6 - 33.0 pg   MCHC 32.4 31.5 - 35.7 g/dL   RDW 04.516.5 (H) 40.912.3 - 81.115.4 %   Platelets 190 150 - 450 x10E3/uL   Neutrophils 65 Not Estab. %   Lymphs 27 Not Estab. %   Monocytes 6 Not Estab. %   Eos 2 Not Estab. %   Basos 0 Not Estab. %   Neutrophils Absolute 3.5 1.4 - 7.0 x10E3/uL   Lymphocytes Absolute 1.4 0.7 - 3.1 x10E3/uL   Monocytes Absolute 0.3 0.1 - 0.9 x10E3/uL   EOS (ABSOLUTE) 0.1 0.0 - 0.4 x10E3/uL   Basophils Absolute 0.0 0.0 - 0.2 x10E3/uL   Immature Granulocytes 0 Not Estab. %   Immature Grans (Abs) 0.0 0.0 - 0.1 x10E3/uL  Comprehensive metabolic panel     Status: Abnormal   Collection Time: 11/30/17   1:51 PM  Result Value Ref Range   Glucose 176 (H) 65 - 99 mg/dL   BUN 13 8 - 27 mg/dL   Creatinine, Ser 9.140.97 0.57 - 1.00 mg/dL   GFR calc non Af Amer 64 >59 mL/min/1.73   GFR calc Af Amer 73 >59 mL/min/1.73   BUN/Creatinine Ratio 13 12 - 28   Sodium 142 134 - 144 mmol/L   Potassium 3.8 3.5 - 5.2 mmol/L   Chloride 100 96 - 106 mmol/L   CO2 24 20 - 29 mmol/L   Calcium 8.7 8.7 - 10.3 mg/dL   Total Protein 6.6 6.0 - 8.5 g/dL   Albumin 3.9 3.6 - 4.8 g/dL   Globulin, Total 2.7 1.5 - 4.5 g/dL   Albumin/Globulin Ratio 1.4 1.2 - 2.2   Bilirubin Total 0.5 0.0 - 1.2 mg/dL   Alkaline Phosphatase 73 39 - 117 IU/L   AST 16 0 - 40 IU/L   ALT 14 0 - 32 IU/L  TSH     Status: None   Collection Time: 11/30/17  1:51 PM  Result Value Ref Range   TSH 1.190 0.450 - 4.500 uIU/mL    Hypertension ROS: Review of Systems  Constitutional: Negative.   Eyes: Negative.   Respiratory: Negative.   Cardiovascular: Negative.   Neurological: Negative.   Psychiatric/Behavioral: Negative.      OBJECTIVE:   BP (!) 162/88 (BP Location: Left Arm, Patient Position: Sitting, Cuff Size: Large) Comment: manually  Pulse (!) 48   Temp 97.9 F (36.6 C) (Oral)   Resp 16   Ht 5\' 4"  (1.626 m)   Wt 159 lb (72.1 kg)   SpO2 98%   BMI 27.29 kg/m   Physical Exam  Constitutional: She is oriented to person, place, and time. She appears well-developed and well-nourished. No distress.  HENT:  Head: Normocephalic and atraumatic.  Eyes: Pupils are equal, round, and reactive to light. Conjunctivae are normal.  Neck: Normal range of motion.  Cardiovascular: Normal rate, regular rhythm and intact distal pulses.  Murmur heard. Pulmonary/Chest: Effort normal and breath sounds normal. No respiratory distress.  Neurological: She is alert and oriented to person, place, and time.  Skin: Skin is warm and dry.  Psychiatric: She has a normal mood and affect. Her behavior is normal. Judgment and thought content normal.  Nursing  note  and vitals reviewed.    ASSESSMENT/PLAN:  1. Hypertension, unspecified type  - lisinopril-hydrochlorothiazide (PRINZIDE,ZESTORETIC) 20-25 MG tablet; Take 1 tablet by mouth daily.  Dispense: 30 tablet; Refill: 5 - metoprolol tartrate (LOPRESSOR) 25 MG tablet; Take 1 tablet (25 mg total) by mouth 2 (two) times daily.  Dispense: 60 tablet; Refill: 5  2. Non compliance w medication regimen Advised patient to take medications daily as prescribed. Also gave information on exercise and diet.     Return to care as scheduled and prn. Patient verbalized understanding and agreed with plan of care.    Ms. Freda Jacksonndr L. Riley Lamouglas, FNP-BC [facility] [address]  [place], [place] [id number] [phone]

## 2017-12-28 ENCOUNTER — Ambulatory Visit: Payer: Self-pay

## 2017-12-28 VITALS — BP 150/78

## 2017-12-28 DIAGNOSIS — I10 Essential (primary) hypertension: Secondary | ICD-10-CM

## 2018-01-04 NOTE — Progress Notes (Signed)
Adela Glimpse, FNP Reason for referral-hypertension and CAD  HPI: 62 year old female for evaluation of hypertension and CAD at request of Mike Gip, FNP.  Previously followed by Dr. Jacinto Halim.  Had catheterization July 2014 that showed ejection fraction 60%.  There was a 99% left circumflex lesion and a 60 to 70% LAD.  Patient had to CI of circumflex with drug-eluting stent.  Patient recently seen with markedly elevated blood pressure but states she was not taking her medications.  Medicines were resumed and blood pressure has improved.  However still mildly elevated.  She otherwise denies dyspnea on exertion, orthopnea, PND, pedal edema, chest pain or syncope.  Cardiology now asked to evaluate.  Current Outpatient Medications  Medication Sig Dispense Refill  . aspirin 81 MG chewable tablet Chew 81 mg by mouth daily.    . BucAlfAspKGlucCouchParsUvaUrJu (NATURAL WATER PILLS) TABS Take 1 tablet by mouth daily.    Marland Kitchen glyBURIDE (DIABETA) 5 MG tablet Take 2 tablets (10 mg total) by mouth daily with breakfast. 60 tablet 5  . KRILL OIL 1000 MG CAPS Take 1 capsule by mouth daily.    Marland Kitchen lisinopril-hydrochlorothiazide (PRINZIDE,ZESTORETIC) 20-25 MG tablet Take 1 tablet by mouth daily. 30 tablet 5  . metFORMIN (GLUCOPHAGE) 500 MG tablet Take 1 tablet (500 mg total) by mouth 2 (two) times daily with a meal. 60 tablet 0  . metoprolol tartrate (LOPRESSOR) 25 MG tablet Take 1 tablet (25 mg total) by mouth 2 (two) times daily. 60 tablet 5  . omega-3 acid ethyl esters (LOVAZA) 1 G capsule Take 1 capsule (1 g total) by mouth daily. 30 capsule 11   No current facility-administered medications for this visit.     Allergies  Allergen Reactions  . Penicillins Rash     Past Medical History:  Diagnosis Date  . CAD (coronary artery disease)   . Diabetes mellitus without complication (HCC)   . DM (diabetes mellitus), type 2, uncontrolled with complications (HCC) 04/06/2012  . Hypertension   .  Hypertensive emergency 11/14/2012  . Hypokalemia 11/14/2012  . Hyponatremia 04/08/2012  . Mixed hyperlipidemia 04/06/2012  . NSTEMI (non-ST elevated myocardial infarction) (HCC) 11/14/2012  . Tobacco abuse 04/06/2012    Past Surgical History:  Procedure Laterality Date  . LEFT HEART CATHETERIZATION WITH CORONARY ANGIOGRAM N/A 11/14/2012   Procedure: LEFT HEART CATHETERIZATION WITH CORONARY ANGIOGRAM;  Surgeon: Pamella Pert, MD;  Location: [facility] CATH LAB;  Service: Cardiovascular;  Laterality: N/A;  . PERCUTANEOUS CORONARY STENT INTERVENTION (PCI-S)  11/14/2012   Procedure: PERCUTANEOUS CORONARY STENT INTERVENTION (PCI-S);  Surgeon: Pamella Pert, MD;  Location: [facility] CATH LAB;  Service: Cardiovascular;;  . TONSILLECTOMY      Social History   Socioeconomic History  . Marital status: Single    Spouse name: Not on file  . Number of children: Not on file  . Years of education: Not on file  . Highest education level: Not on file  Occupational History  . Not on file  Social Needs  . Financial resource strain: Not on file  . Food insecurity:    Worry: Not on file    Inability: Not on file  . Transportation needs:    Medical: Not on file    Non-medical: Not on file  Tobacco Use  . Smoking status: Former Smoker    Packs/day: 1.00    Types: Cigarettes  . Smokeless tobacco: Never Used  Substance and Sexual Activity  . Alcohol use: Yes    Comment: occ  . Drug  use: No  . Sexual activity: Not on file  Lifestyle  . Physical activity:    Days per week: Not on file    Minutes per session: Not on file  . Stress: Not on file  Relationships  . Social connections:    Talks on phone: Not on file    Gets together: Not on file    Attends religious service: Not on file    Active member of club or organization: Not on file    Attends meetings of clubs or organizations: Not on file    Relationship status: Not on file  . Intimate partner violence:    Fear of current or ex partner: Not on  file    Emotionally abused: Not on file    Physically abused: Not on file    Forced sexual activity: Not on file  Other Topics Concern  . Not on file  Social History Narrative  . Not on file    Family History  Problem Relation Age of Onset  . Diabetes Brother   . Heart disease Brother   . Arthritis Brother   . Diabetes Father   . Heart disease Father     ROS: no fevers or chills, productive cough, hemoptysis, dysphasia, odynophagia, melena, hematochezia, dysuria, hematuria, rash, seizure activity, orthopnea, PND, pedal edema, claudication. Remaining systems are negative.  Physical Exam:   Blood pressure (!) 148/92, pulse (!) 55, height 5\' 4"  (1.626 m), weight 160 lb (72.6 kg).  General:  Well developed/well nourished in NAD Skin warm/dry Patient not depressed No peripheral clubbing Back-normal HEENT-normal/normal eyelids Neck supple/normal carotid upstroke bilaterally; no bruits; no JVD; no thyromegaly chest - CTA/ normal expansion CV - RRR/normal S1 and S2; no murmurs, rubs or gallops;  PMI nondisplaced Abdomen -NT/ND, no HSM, no mass, + bowel sounds, no bruit 2+ femoral pulses, no bruits Ext-no edema, chords, 2+ DP Neuro-grossly nonfocal  ECG -sinus bradycardia at a rate of 55.  Right bundle branch block.  Inferior lateral T wave inversion.  Personally reviewed  A/P  1 coronary artery disease-patient with prior PCI.  No chest pain.  Plan to continue aspirin.  Add statin.  We discussed lifestyle modification including diet and exercise.  She discontinued her tobacco use in January of this year.  2 hypertension-patient's blood pressure remains elevated.  Continue metoprolol, lisinopril and HCTZ.  Add amlodipine 5 mg daily.  Follow blood pressure and adjust regimen as needed.  I will have her seen back in blood pressure clinic in 4 weeks.  Check potassium and renal function.  3 hyperlipidemia-given history of coronary disease I will add Lipitor 40 mg daily.  Check lipids  and liver in 4 weeks.  Olga Millers, MD

## 2018-01-08 ENCOUNTER — Ambulatory Visit (INDEPENDENT_AMBULATORY_CARE_PROVIDER_SITE_OTHER): Payer: PRIVATE HEALTH INSURANCE | Admitting: Cardiology

## 2018-01-08 ENCOUNTER — Encounter: Payer: Self-pay | Admitting: Cardiology

## 2018-01-08 VITALS — BP 148/92 | HR 55 | Ht 64.0 in | Wt 160.0 lb

## 2018-01-08 DIAGNOSIS — E7800 Pure hypercholesterolemia, unspecified: Secondary | ICD-10-CM

## 2018-01-08 DIAGNOSIS — I10 Essential (primary) hypertension: Secondary | ICD-10-CM

## 2018-01-08 DIAGNOSIS — I214 Non-ST elevation (NSTEMI) myocardial infarction: Secondary | ICD-10-CM

## 2018-01-08 DIAGNOSIS — Z79899 Other long term (current) drug therapy: Secondary | ICD-10-CM | POA: Diagnosis not present

## 2018-01-08 DIAGNOSIS — I2510 Atherosclerotic heart disease of native coronary artery without angina pectoris: Secondary | ICD-10-CM | POA: Diagnosis not present

## 2018-01-08 MED ORDER — AMLODIPINE BESYLATE 5 MG PO TABS: 5.0000 mg | ORAL_TABLET | Freq: Every day | ORAL | 3 refills | Status: DC

## 2018-01-08 MED ORDER — ATORVASTATIN CALCIUM 40 MG PO TABS: 40.0000 mg | ORAL_TABLET | Freq: Every day | ORAL | 3 refills | Status: DC

## 2018-01-08 NOTE — Patient Instructions (Signed)
Medication Instructions: Your Physician recommend you make the following changes to your medication. Start: Amlodipine 5 mg daily           Lipitor 40 mg daily   If you need a refill on your cardiac medications before your next appointment, please call your pharmacy.   Labwork: Your physician recommends that you return for lab work today (BMP)  Your physician recommends that you return for lab work in 4 weeks (Lipid, LFT)    Procedures/Testing: None  Follow-Up: Your physician wants you to follow-up first available with Hypertension Clinic and 6 months with Dr. Jens Som.    Special Instructions:    Thank you for choosing [facility] at [facility]!!

## 2018-01-09 LAB — BASIC METABOLIC PANEL
BUN/Creatinine Ratio: 15 (ref 12–28)
BUN: 15 mg/dL (ref 8–27)
CALCIUM: 9 mg/dL (ref 8.7–10.3)
CO2: 24 mmol/L (ref 20–29)
Chloride: 96 mmol/L (ref 96–106)
Creatinine, Ser: 0.99 mg/dL (ref 0.57–1.00)
GFR, EST AFRICAN AMERICAN: 72 mL/min/{1.73_m2} (ref >59)
GFR, EST NON AFRICAN AMERICAN: 62 mL/min/{1.73_m2} (ref >59)
Glucose: 188 mg/dL — ABNORMAL HIGH (ref 65–99)
Potassium: 3.9 mmol/L (ref 3.5–5.2)
Sodium: 139 mmol/L (ref 134–144)

## 2018-01-23 ENCOUNTER — Encounter: Payer: Self-pay | Admitting: *Deleted

## 2018-03-16 ENCOUNTER — Ambulatory Visit (INDEPENDENT_AMBULATORY_CARE_PROVIDER_SITE_OTHER): Payer: Self-pay | Admitting: Family Medicine

## 2018-03-16 VITALS — BP 144/82 | HR 54 | Temp 97.4°F | Resp 16 | Ht 64.0 in | Wt 161.0 lb

## 2018-03-16 DIAGNOSIS — I10 Essential (primary) hypertension: Secondary | ICD-10-CM

## 2018-03-16 DIAGNOSIS — E118 Type 2 diabetes mellitus with unspecified complications: Secondary | ICD-10-CM

## 2018-03-16 DIAGNOSIS — IMO0002 Reserved for concepts with insufficient information to code with codable children: Secondary | ICD-10-CM

## 2018-03-16 DIAGNOSIS — E1165 Type 2 diabetes mellitus with hyperglycemia: Secondary | ICD-10-CM

## 2018-03-16 LAB — POCT URINALYSIS DIPSTICK
Bilirubin, UA: NEGATIVE
Blood, UA: NEGATIVE
Glucose, UA: POSITIVE — AB
Ketones, UA: NEGATIVE
Leukocytes, UA: NEGATIVE
Nitrite, UA: NEGATIVE
Protein, UA: POSITIVE — AB
Spec Grav, UA: 1.02 (ref 1.010–1.025)
Urobilinogen, UA: 0.2 E.U./dL
pH, UA: 5.5 (ref 5.0–8.0)

## 2018-03-16 LAB — POCT GLYCOSYLATED HEMOGLOBIN (HGB A1C): Hemoglobin A1C: 8.7 % — AB (ref 4.0–5.6)

## 2018-03-16 MED ORDER — METFORMIN HCL 1000 MG PO TABS: 1000.0000 mg | ORAL_TABLET | Freq: Two times a day (BID) | ORAL | 3 refills | Status: DC

## 2018-03-16 MED ORDER — AMLODIPINE BESYLATE 5 MG PO TABS: 5.0000 mg | ORAL_TABLET | Freq: Every day | ORAL | 3 refills | Status: DC

## 2018-03-16 MED ORDER — ATORVASTATIN CALCIUM 40 MG PO TABS: 40.0000 mg | ORAL_TABLET | Freq: Every day | ORAL | 3 refills | Status: DC

## 2018-03-16 NOTE — Progress Notes (Signed)
[facility]  Provider: Mike Gip, FNP   Hypertension Follow Up Visit  SUBJECTIVE:  Rebecca [name] is a 62 y.o. female who  has a past medical history of CAD (coronary artery disease), Diabetes mellitus without complication (HCC), DM (diabetes mellitus), type 2, uncontrolled with complications (HCC) (04/06/2012), Hypertension, Hypertensive emergency (11/14/2012), Hypokalemia (11/14/2012), Hyponatremia (04/08/2012), Mixed hyperlipidemia (04/06/2012), NSTEMI (non-ST elevated myocardial infarction) (HCC) (11/14/2012), and Tobacco abuse (04/06/2012). .   New concerns:  Patient recently seen by cardiology on 01/08/2018. Patient started on amlodipine 5mg  and lipitor 40mg .  Patient states that she has not started taking either medication. She states that there was a problem with the pharmacy and she has not followed up with this. She has not followed up with HTN clinic or made an appointment.   Patient denies chest pain , SOB, dizziness or leg swelling.   Current Outpatient Medications  Medication Sig Dispense Refill  . aspirin 81 MG chewable tablet Chew 81 mg by mouth daily.    Marland Kitchen atorvastatin (LIPITOR) 40 MG tablet Take 1 tablet (40 mg total) by mouth daily at 6 PM. 90 tablet 3  . BucAlfAspKGlucCouchParsUvaUrJu (NATURAL WATER PILLS) TABS Take 1 tablet by mouth daily.    Marland Kitchen glyBURIDE (DIABETA) 5 MG tablet Take 2 tablets (10 mg total) by mouth daily with breakfast. 60 tablet 5  . KRILL OIL 1000 MG CAPS Take 1 capsule by mouth daily.    Marland Kitchen lisinopril-hydrochlorothiazide (PRINZIDE,ZESTORETIC) 20-25 MG tablet Take 1 tablet by mouth daily. 30 tablet 5  . metFORMIN (GLUCOPHAGE) 500 MG tablet Take 1 tablet (500 mg total) by mouth 2 (two) times daily with a meal. 60 tablet 0  . metoprolol tartrate (LOPRESSOR) 25 MG tablet Take 1 tablet (25 mg total) by mouth 2 (two) times daily. 60 tablet 5  . omega-3 acid ethyl esters (LOVAZA) 1 G capsule Take 1 capsule (1  g total) by mouth daily. 30 capsule 11  . amLODipine (NORVASC) 5 MG tablet Take 1 tablet (5 mg total) by mouth daily. (Patient not taking: Reported on 03/16/2018) 90 tablet 3   No current facility-administered medications for this visit.     Recent Results (from the past 2160 hour(s))  Basic metabolic panel     Status: Abnormal   Collection Time: 01/08/18  2:41 PM  Result Value Ref Range   Glucose 188 (H) 65 - 99 mg/dL   BUN 15 8 - 27 mg/dL   Creatinine, Ser 9.60 0.57 - 1.00 mg/dL   GFR calc non Af Amer 62 >59 mL/min/1.73   GFR calc Af Amer 72 >59 mL/min/1.73   BUN/Creatinine Ratio 15 12 - 28   Sodium 139 134 - 144 mmol/L   Potassium 3.9 3.5 - 5.2 mmol/L   Chloride 96 96 - 106 mmol/L   CO2 24 20 - 29 mmol/L   Calcium 9.0 8.7 - 10.3 mg/dL    Hypertension ROS: Review of Systems  Constitutional: Negative.   HENT: Negative.   Eyes: Negative.   Respiratory: Negative.   Cardiovascular: Negative.   Gastrointestinal: Negative.   Genitourinary: Negative.   Musculoskeletal: Negative.   Skin: Negative.   Neurological: Negative.   Psychiatric/Behavioral: Negative.      OBJECTIVE:   BP (!) 144/82 (BP Location: Right Arm, Patient Position: Sitting, Cuff Size: Normal) Comment: manually  Pulse (!) 54   Temp (!) 97.4 F (36.3 C) (Oral)   Resp 16   Ht 5\' 4"  (1.626 m)  Wt 161 lb (73 kg)   SpO2 100%   BMI 27.64 kg/m   Physical Exam  Constitutional: She is oriented to person, place, and time and well-developed, well-nourished, and in no distress. No distress.  HENT:  Head: Normocephalic and atraumatic.  Eyes: Pupils are equal, round, and reactive to light. Conjunctivae and EOM are normal.  Neck: Normal range of motion. Neck supple.  Cardiovascular: Normal rate, regular rhythm and intact distal pulses. Exam reveals no gallop and no friction rub.  No murmur heard. Pulmonary/Chest: Effort normal and breath sounds normal. No respiratory distress. She has no wheezes.  Abdominal: Soft.  Bowel sounds are normal. There is no tenderness.  Musculoskeletal: Normal range of motion. She exhibits no edema or tenderness.  Lymphadenopathy:    She has no cervical adenopathy.  Neurological: She is alert and oriented to person, place, and time. Gait normal.  Skin: Skin is warm and dry.  Psychiatric: Mood, memory, affect and judgment normal.  Nursing note and vitals reviewed.    ASSESSMENT/PLAN: 1. Hypertension, unspecified type Patient reports taking Lisinopril-HCTZ BID instead of QD. Advised patient to take medications as prescribed and to start amlodipine. Continue with metoprolol BID.  Sent the amlodipine and lipitor to the pharmacy on file. Advised to start ASAP.  - Urinalysis Dipstick - amLODipine (NORVASC) 5 MG tablet; Take 1 tablet (5 mg total) by mouth daily.  Dispense: 90 tablet; Refill: 3  2. DM (diabetes mellitus), type 2, uncontrolled with complications (HCC) A1 C has increased from 7.8 to 8.7.  Increased metformin to 1000 mg BID. Will repeat in 3 months. Discussed diet and exercise.  - HgB A1c - atorvastatin (LIPITOR) 40 MG tablet; Take 1 tablet (40 mg total) by mouth daily at 6 PM.  Dispense: 90 tablet; Refill: 3     The patient is asked to make an attempt to improve diet and exercise patterns to aid in medical management of this problem.  Return to care as scheduled and prn. Patient verbalized understanding and agreed with plan of care.    Ms. Freda Jacksonndr L. Riley Lamouglas, FNP-BC [facility] [address]  [place], [place] [id number] [phone]

## 2018-03-16 NOTE — Patient Instructions (Signed)
I increased your metformin to 1,000mg  twice a day. I sent the amlodipine and lipitor to the pharmacy. Lisinopril-HCTZ should only be once per day.   Diabetes Mellitus and Nutrition When you have diabetes (diabetes mellitus), it is very important to have healthy eating habits because your blood sugar (glucose) levels are greatly affected by what you eat and drink. Eating healthy foods in the appropriate amounts, at about the same times every day, can help you:  Control your blood glucose.  Lower your risk of heart disease.  Improve your blood pressure.  Reach or maintain a healthy weight.  Every person with diabetes is different, and each person has different needs for a meal plan. Your health care provider may recommend that you work with a diet and nutrition specialist (dietitian) to make a meal plan that is best for you. Your meal plan may vary depending on factors such as:  The calories you need.  The medicines you take.  Your weight.  Your blood glucose, blood pressure, and cholesterol levels.  Your activity level.  Other health conditions you have, such as heart or kidney disease.  How do carbohydrates affect me? Carbohydrates affect your blood glucose level more than any other type of food. Eating carbohydrates naturally increases the amount of glucose in your blood. Carbohydrate counting is a method for keeping track of how many carbohydrates you eat. Counting carbohydrates is important to keep your blood glucose at a healthy level, especially if you use insulin or take certain oral diabetes medicines. It is important to know how many carbohydrates you can safely have in each meal. This is different for every person. Your dietitian can help you calculate how many carbohydrates you should have at each meal and for snack. Foods that contain carbohydrates include:  Bread, cereal, rice, pasta, and crackers.  Potatoes and corn.  Peas, beans, and lentils.  Milk and  yogurt.  Fruit and juice.  Desserts, such as cakes, cookies, ice cream, and candy.  How does alcohol affect me? Alcohol can cause a sudden decrease in blood glucose (hypoglycemia), especially if you use insulin or take certain oral diabetes medicines. Hypoglycemia can be a life-threatening condition. Symptoms of hypoglycemia (sleepiness, dizziness, and confusion) are similar to symptoms of having too much alcohol. If your health care provider says that alcohol is safe for you, follow these guidelines:  Limit alcohol intake to no more than 1 drink per day for nonpregnant women and 2 drinks per day for men. One drink equals 12 oz of beer, 5 oz of wine, or 1 oz of hard liquor.  Do not drink on an empty stomach.  Keep yourself hydrated with water, diet soda, or unsweetened iced tea.  Keep in mind that regular soda, juice, and other mixers may contain a lot of sugar and must be counted as carbohydrates.  What are tips for following this plan? Reading food labels  Start by checking the serving size on the label. The amount of calories, carbohydrates, fats, and other nutrients listed on the label are based on one serving of the food. Many foods contain more than one serving per package.  Check the total grams (g) of carbohydrates in one serving. You can calculate the number of servings of carbohydrates in one serving by dividing the total carbohydrates by 15. For example, if a food has 30 g of total carbohydrates, it would be equal to 2 servings of carbohydrates.  Check the number of grams (g) of saturated and trans fats in  one serving. Choose foods that have low or no amount of these fats.  Check the number of milligrams (mg) of sodium in one serving. Most people should limit total sodium intake to less than 2,300 mg per day.  Always check the nutrition information of foods labeled as "low-fat" or "nonfat". These foods may be higher in added sugar or refined carbohydrates and should be  avoided.  Talk to your dietitian to identify your daily goals for nutrients listed on the label. Shopping  Avoid buying canned, premade, or processed foods. These foods tend to be high in fat, sodium, and added sugar.  Shop around the outside edge of the grocery store. This includes fresh fruits and vegetables, bulk grains, fresh meats, and fresh dairy. Cooking  Use low-heat cooking methods, such as baking, instead of high-heat cooking methods like deep frying.  Cook using healthy oils, such as olive, canola, or sunflower oil.  Avoid cooking with butter, cream, or high-fat meats. Meal planning  Eat meals and snacks regularly, preferably at the same times every day. Avoid going long periods of time without eating.  Eat foods high in fiber, such as fresh fruits, vegetables, beans, and whole grains. Talk to your dietitian about how many servings of carbohydrates you can eat at each meal.  Eat 4-6 ounces of lean protein each day, such as lean meat, chicken, fish, eggs, or tofu. 1 ounce is equal to 1 ounce of meat, chicken, or fish, 1 egg, or 1/4 cup of tofu.  Eat some foods each day that contain healthy fats, such as avocado, nuts, seeds, and fish. Lifestyle   Check your blood glucose regularly.  Exercise at least 30 minutes 5 or more days each week, or as told by your health care provider.  Take medicines as told by your health care provider.  Do not use any products that contain nicotine or tobacco, such as cigarettes and e-cigarettes. If you need help quitting, ask your health care provider.  Work with a Veterinary surgeon or diabetes educator to identify strategies to manage stress and any emotional and social challenges. What are some questions to ask my health care provider?  Do I need to meet with a diabetes educator?  Do I need to meet with a dietitian?  What number can I call if I have questions?  When are the best times to check my blood glucose? Where to find more  information:  American Diabetes Association: diabetes.org/food-and-fitness/food  Academy of Nutrition and Dietetics: https://www.vargas.com/  [employer] of Diabetes and Digestive and Kidney Diseases (NIH): FindJewelers.cz Summary  A healthy meal plan will help you control your blood glucose and maintain a healthy lifestyle.  Working with a diet and nutrition specialist (dietitian) can help you make a meal plan that is best for you.  Keep in mind that carbohydrates and alcohol have immediate effects on your blood glucose levels. It is important to count carbohydrates and to use alcohol carefully. This information is not intended to replace advice given to you by your health care provider. Make sure you discuss any questions you have with your health care provider. Document Released: 01/13/2005 Document Revised: 05/23/2016 Document Reviewed: 05/23/2016 Elsevier Interactive Patient Education  [employer].

## 2018-03-17 LAB — BASIC METABOLIC PANEL
BUN/Creatinine Ratio: 17 (ref 12–28)
BUN: 14 mg/dL (ref 8–27)
CO2: 25 mmol/L (ref 20–29)
Calcium: 9 mg/dL (ref 8.7–10.3)
Chloride: 95 mmol/L — ABNORMAL LOW (ref 96–106)
Creatinine, Ser: 0.84 mg/dL (ref 0.57–1.00)
GFR calc Af Amer: 87 mL/min/{1.73_m2} (ref >59)
GFR calc non Af Amer: 75 mL/min/{1.73_m2} (ref >59)
Glucose: 329 mg/dL — ABNORMAL HIGH (ref 65–99)
Potassium: 4.3 mmol/L (ref 3.5–5.2)
Sodium: 135 mmol/L (ref 134–144)

## 2018-06-18 ENCOUNTER — Ambulatory Visit (INDEPENDENT_AMBULATORY_CARE_PROVIDER_SITE_OTHER): Payer: Self-pay | Admitting: Family Medicine

## 2018-06-18 ENCOUNTER — Encounter: Payer: Self-pay | Admitting: Family Medicine

## 2018-06-18 VITALS — BP 140/62 | HR 55 | Temp 97.5°F | Resp 16 | Ht 64.0 in | Wt 160.0 lb

## 2018-06-18 DIAGNOSIS — E782 Mixed hyperlipidemia: Secondary | ICD-10-CM

## 2018-06-18 DIAGNOSIS — E1165 Type 2 diabetes mellitus with hyperglycemia: Secondary | ICD-10-CM

## 2018-06-18 DIAGNOSIS — IMO0002 Reserved for concepts with insufficient information to code with codable children: Secondary | ICD-10-CM

## 2018-06-18 DIAGNOSIS — E118 Type 2 diabetes mellitus with unspecified complications: Secondary | ICD-10-CM

## 2018-06-18 DIAGNOSIS — I10 Essential (primary) hypertension: Secondary | ICD-10-CM

## 2018-06-18 LAB — POCT URINALYSIS DIPSTICK
Bilirubin, UA: NEGATIVE
Blood, UA: NEGATIVE
Glucose, UA: POSITIVE — AB
Ketones, UA: NEGATIVE
Leukocytes, UA: NEGATIVE
Nitrite, UA: NEGATIVE
Protein, UA: POSITIVE — AB
Spec Grav, UA: 1.025 (ref 1.010–1.025)
Urobilinogen, UA: 1 E.U./dL
pH, UA: 5 (ref 5.0–8.0)

## 2018-06-18 MED ORDER — LISINOPRIL-HYDROCHLOROTHIAZIDE 20-25 MG PO TABS: 1.0000 | ORAL_TABLET | Freq: Every day | ORAL | 5 refills | Status: DC

## 2018-06-18 NOTE — Progress Notes (Signed)
[facility]   Progress Note: General Provider: Mike Gip, FNP  SUBJECTIVE:   Rebecca [name] is a 63 y.o. female who  has a past medical history of CAD (coronary artery disease), Diabetes mellitus without complication (HCC), DM (diabetes mellitus), type 2, uncontrolled with complications (HCC) (04/06/2012), Hypertension, Hypertensive emergency (11/14/2012), Hypokalemia (11/14/2012), Hyponatremia (04/08/2012), Mixed hyperlipidemia (04/06/2012), NSTEMI (non-ST elevated myocardial infarction) (HCC) (11/14/2012), and Tobacco abuse (04/06/2012).. Patient presents today for Hypertension; Hyperlipidemia; and Diabetes Patient reports not being compliant with medications, diet or exercise. She was last seen by cardiology in 12/2017. She denies problems or concerns today.    Review of Systems  Constitutional: Negative.   HENT: Negative.   Eyes: Negative.   Respiratory: Negative.   Cardiovascular: Negative.   Gastrointestinal: Negative.   Genitourinary: Negative.   Musculoskeletal: Negative.   Skin: Negative.   Neurological: Negative.   Psychiatric/Behavioral: Negative.      OBJECTIVE: BP 140/62   Pulse (!) 55   Temp (!) 97.5 F (36.4 C) (Oral)   Resp 16   Ht 5\' 4"  (1.626 m)   Wt 160 lb (72.6 kg)   SpO2 99%   BMI 27.46 kg/m   Wt Readings from Last 3 Encounters:  06/18/18 160 lb (72.6 kg)  03/16/18 161 lb (73 kg)  01/08/18 160 lb (72.6 kg)     Physical Exam Vitals signs and nursing note reviewed.  Constitutional:      General: She is not in acute distress.    Appearance: She is well-developed.  HENT:     Head: Normocephalic and atraumatic.  Eyes:     Conjunctiva/sclera: Conjunctivae normal.     Pupils: Pupils are equal, round, and reactive to light.  Neck:     Musculoskeletal: Normal range of motion.  Cardiovascular:     Rate and Rhythm: Normal rate and regular rhythm.     Heart sounds: Normal heart sounds.  Pulmonary:   Effort: Pulmonary effort is normal. No respiratory distress.     Breath sounds: Normal breath sounds.  Abdominal:     General: Bowel sounds are normal. There is no distension.     Palpations: Abdomen is soft.  Musculoskeletal: Normal range of motion.  Skin:    General: Skin is warm and dry.  Neurological:     Mental Status: She is alert and oriented to person, place, and time.  Psychiatric:        Behavior: Behavior normal.        Thought Content: Thought content normal.     ASSESSMENT/PLAN: 1. DM (diabetes mellitus), type 2, uncontrolled with complications (HCC) - Hemoglobin A1C-11.8 today. This is an increase. Patient is non compliant with diet and exercise.   2. Hypertension, unspecified type - Urinalysis Dipstick - Hemoglobin A1C - lisinopril-hydrochlorothiazide (PRINZIDE,ZESTORETIC) 20-25 MG tablet; Take 1 tablet by mouth daily.  Dispense: 30 tablet; Refill: 5 - Microalbumin, urine  3. Mixed hyperlipidemia - Lipid Panel Patient with triglycerides over 2,000. She continues to be non compliant with medications, diet and exercise.   Medications refilled. The patient is asked to make an attempt to improve diet and exercise patterns to aid in medical management of this problem.  Return in about 3 months (around 09/16/2018).    The patient was given clear instructions to go to ER or return to medical center if symptoms do not improve, worsen or new problems develop. The patient verbalized understanding and agreed with plan of care.   Ms. Rebecca [name].  Riley Lam, FNP-BC [facility] [address] [place], [place] [address] [phone]

## 2018-06-19 LAB — LIPID PANEL
Chol/HDL Ratio: 11.3 ratio — ABNORMAL HIGH (ref 0.0–4.4)
Cholesterol, Total: 271 mg/dL — ABNORMAL HIGH (ref 100–199)
HDL: 24 mg/dL — ABNORMAL LOW (ref >39)
Triglycerides: 1431 mg/dL (ref 0–149)

## 2018-06-19 LAB — HEMOGLOBIN A1C
Est. average glucose Bld gHb Est-mCnc: 292 mg/dL
Hgb A1c MFr Bld: 11.8 % — ABNORMAL HIGH (ref 4.8–5.6)

## 2018-06-19 LAB — MICROALBUMIN, URINE: Microalbumin, Urine: 803.4 ug/mL

## 2018-06-21 ENCOUNTER — Other Ambulatory Visit: Payer: Self-pay | Admitting: Family Medicine

## 2018-06-21 DIAGNOSIS — E782 Mixed hyperlipidemia: Secondary | ICD-10-CM

## 2018-06-21 DIAGNOSIS — E118 Type 2 diabetes mellitus with unspecified complications: Secondary | ICD-10-CM

## 2018-06-21 DIAGNOSIS — E1165 Type 2 diabetes mellitus with hyperglycemia: Secondary | ICD-10-CM

## 2018-06-21 DIAGNOSIS — IMO0002 Reserved for concepts with insufficient information to code with codable children: Secondary | ICD-10-CM

## 2018-06-21 DIAGNOSIS — I214 Non-ST elevation (NSTEMI) myocardial infarction: Secondary | ICD-10-CM

## 2018-06-21 MED ORDER — OMEGA-3-ACID ETHYL ESTERS 1 G PO CAPS: 2.0000 g | ORAL_CAPSULE | Freq: Two times a day (BID) | ORAL | 5 refills | Status: DC

## 2018-06-21 MED ORDER — ATORVASTATIN CALCIUM 80 MG PO TABS: 80.0000 mg | ORAL_TABLET | Freq: Every day | ORAL | 3 refills | Status: DC

## 2018-06-21 NOTE — Progress Notes (Signed)
Please let patient know that her triglycerides have gone down, howerver they are still extremely high. I am sending a prescription strenghth fish oil to the pharamcy. I also increased lipitor to 80mg  daily. Her A1C has also increased and is over 11 points. The goal is to get to 7 or less. She will need to decrease her carbohydrate intake. Carbs= sodas, cake, cookies, donughts. Bread, rice, pasta. She has to be compliant. The other option is to start insulin.

## 2018-06-22 ENCOUNTER — Telehealth: Payer: Self-pay

## 2018-06-22 NOTE — Telephone Encounter (Signed)
-----   Message from Mike Gip, FNP sent at 06/21/2018  4:42 PM EST ----- Please let patient know that her triglycerides have gone down, howerver they are still extremely high. I am sending a prescription strenghth fish oil to the pharamcy. I also increased lipitor to 80mg  daily. Her A1C has also increased and is over 11 points. The goal is to get to 7 or less. She will need to decrease her carbohydrate intake. Carbs= sodas, cake, cookies, donughts. Bread, rice, pasta. She has to be compliant. The other option is to start insulin.

## 2018-06-22 NOTE — Telephone Encounter (Signed)
Called and spoke with patient. Advised that triglycerides have come down but they are still extremely high. Advised that we have sent in rx strength fish oil, and increased the lipitor to 80mg  daily. Asked that she take these medications daily as prescribed. Advised that a1c is over 11 and the goal is to have at 7 or less. Advised that she needs to decrease her carb intake and the next option is to start insulin. Patient verbalized understanding. Thanks!

## 2018-07-23 NOTE — Patient Instructions (Signed)

## 2018-08-15 ENCOUNTER — Ambulatory Visit (INDEPENDENT_AMBULATORY_CARE_PROVIDER_SITE_OTHER): Payer: Self-pay | Admitting: Family Medicine

## 2018-08-15 ENCOUNTER — Other Ambulatory Visit: Payer: Self-pay

## 2018-08-15 ENCOUNTER — Encounter: Payer: Self-pay | Admitting: Family Medicine

## 2018-08-15 DIAGNOSIS — M109 Gout, unspecified: Secondary | ICD-10-CM

## 2018-08-15 MED ORDER — NAPROXEN 500 MG PO TABS: 500.0000 mg | ORAL_TABLET | Freq: Two times a day (BID) | ORAL | 2 refills | Status: AC

## 2018-08-15 NOTE — Progress Notes (Signed)
  [facility]  Virtual Visit via Telephone Note  I connected with Rebecca [name] on 08/15/18 at 11:20 AM EDT by telephone and verified that I am speaking with the correct person using two identifiers.   I discussed the limitations, risks, security and privacy concerns of performing an evaluation and management service by telephone and the availability of in person appointments. I also discussed with the patient that there may be a patient responsible charge related to this service. The patient expressed understanding and agreed to proceed.   History of Present Illness: Patient states that she is having left foot pain due to a history of gout. She states that she ate too many eggs which caused a flare. She reports taking a juice to help with this. The pain continues and she is having pain in her left shin. She denies erythema or swelling of the shin. She states that her left great toe is mildly erythematous.     Observations/Objective: Patient with regular voice tone, rate and rhythm. Speaking calmly and is in no apparent distress.     Assessment and Plan:  1. Acute gout involving toe of left foot, unspecified cause - naproxen (NAPROSYN) 500 MG tablet; Take 1 tablet (500 mg total) by mouth 2 (two) times daily with a meal for 10 days.  Dispense: 20 tablet; Refill: 2    Follow Up Instructions:    I discussed the assessment and treatment plan with the patient. The patient was provided an opportunity to ask questions and all were answered. The patient agreed with the plan and demonstrated an understanding of the instructions.   The patient was advised to call back or seek an in-person evaluation if the symptoms worsen or if the condition fails to improve as anticipated.  I provided 10 minutes of non-face-to-face time during this encounter.  Ms. Andr L. Riley Lam, FNP-BC [facility] [address] [place], [place] [address] [phone]

## 2018-08-22 ENCOUNTER — Telehealth: Payer: Self-pay | Admitting: *Deleted

## 2018-08-22 NOTE — Telephone Encounter (Signed)
08/22/18 08/22/18 LMOM @ 2:08 pm, re: follow up appointment.

## 2018-08-27 ENCOUNTER — Encounter: Payer: Self-pay | Admitting: Family Medicine

## 2018-08-27 ENCOUNTER — Ambulatory Visit (INDEPENDENT_AMBULATORY_CARE_PROVIDER_SITE_OTHER): Payer: Self-pay | Admitting: Family Medicine

## 2018-08-27 ENCOUNTER — Other Ambulatory Visit: Payer: Self-pay

## 2018-08-27 VITALS — BP 182/74 | HR 63 | Temp 97.7°F | Resp 16 | Ht 64.0 in | Wt 160.0 lb

## 2018-08-27 DIAGNOSIS — M10472 Other secondary gout, left ankle and foot: Secondary | ICD-10-CM

## 2018-08-27 MED ORDER — CLOTRIMAZOLE-BETAMETHASONE 1-0.05 % EX CREA: 1.0000 "application " | TOPICAL_CREAM | Freq: Two times a day (BID) | CUTANEOUS | 0 refills | Status: DC

## 2018-08-27 MED ORDER — METHYLPREDNISOLONE SODIUM SUCC 125 MG IJ SOLR
125.0000 mg | Freq: Once | INTRAMUSCULAR | Status: AC
  Administered 2018-08-27: 125 mg via INTRAMUSCULAR

## 2018-08-27 NOTE — Patient Instructions (Signed)

## 2018-08-27 NOTE — Progress Notes (Signed)
[facility]   Progress Note: Sick Visit Provider: Mike GipAndre Lavonne Kinderman, FNP  SUBJECTIVE:   Rebecca [name] is a 63 y.o. female who  has a past medical history of CAD (coronary artery disease), Diabetes mellitus without complication (HCC), DM (diabetes mellitus), type 2, uncontrolled with complications (HCC) (04/06/2012), Hypertension, Hypertensive emergency (11/14/2012), Hypokalemia (11/14/2012), Hyponatremia (04/08/2012), Mixed hyperlipidemia (04/06/2012), NSTEMI (non-ST elevated myocardial infarction) (HCC) (11/14/2012), and Tobacco abuse (04/06/2012).. Patient presents today for Toe Pain (all toes [name] left foot except 4th toe ) and Foot Pain (left foot )  Toe Pain   The incident occurred 3 to 5 days ago. There was no injury mechanism. The pain is present in the left foot and left toes. The pain is mild. The pain has been constant since onset. Associated symptoms include an inability to bear weight. Pertinent negatives include no loss of motion (decreased). She reports no foreign bodies present. The symptoms are aggravated by movement and weight bearing. She has tried NSAIDs and non-weight bearing for the symptoms. The treatment provided mild relief.  History of gout. Has tried tart cherry juice with mild relief. States that the pain has worsened in the past few days. Has not been consistent with taking her naproxen.   Review of Systems  Respiratory: Negative.   Cardiovascular: Negative.   Genitourinary: Negative.   Musculoskeletal: Positive for joint pain (left foot pain).     OBJECTIVE: BP (!) 186/78 (BP Location: Right Arm, Patient Position: Sitting, Cuff Size: Normal)   Pulse 63   Temp 97.7 F (36.5 C) (Oral)   Resp 16   Ht 5\' 4"  (1.626 m)   Wt 160 lb (72.6 kg)   SpO2 100%   BMI 27.46 kg/m   Wt Readings from Last 3 Encounters:  08/27/18 160 lb (72.6 kg)  06/18/18 160 lb (72.6 kg)  03/16/18 161 lb (73 kg)     Physical Exam Constitutional:       General: She is not in acute distress.    Appearance: Normal appearance. She is obese.  HENT:     Head: Normocephalic and atraumatic.  Musculoskeletal:     Left foot: Decreased range of motion.  Feet:     Left foot:     Skin integrity: Erythema present.     Toenail Condition: Left toenails are abnormally thick and long.  Skin:    Findings: Erythema and rash (to the left leg. erythematous. papular rash [name] foot and shin) present.  Neurological:     Mental Status: She is alert and oriented to person, place, and time.  Psychiatric:        Mood and Affect: Mood normal.        Behavior: Behavior normal.        Thought Content: Thought content normal.        Judgment: Judgment normal.         ASSESSMENT/PLAN:  1. Other secondary acute gout of left foot Continue with naproxen BID x 7 days.  - methylPREDNISolone sodium succinate (SOLU-MEDROL) 125 mg/2 mL injection 125 mg - Uric Acid - clotrimazole-betamethasone (LOTRISONE) cream; Apply 1 application topically 2 (two) times daily.  Dispense: 30 g; Refill: 0 - Comprehensive metabolic panel        The patient was given clear instructions to go to ER or return to medical center if symptoms do not improve, worsen or new problems develop. The patient verbalized understanding and agreed with plan of care.   Ms. Freda Jacksonndr L.  Riley Lam, FNP-BC [facility] [address] [place], [place] [address] [phone]     This note has been created with Dragon speech recognition software and smart phrase technology. Any transcriptional errors are unintentional.

## 2018-08-28 LAB — COMPREHENSIVE METABOLIC PANEL
ALT: 19 IU/L (ref 0–32)
AST: 12 IU/L (ref 0–40)
Albumin/Globulin Ratio: 1.8 (ref 1.2–2.2)
Albumin: 4.3 g/dL (ref 3.8–4.8)
Alkaline Phosphatase: 108 IU/L (ref 39–117)
BUN/Creatinine Ratio: 17 (ref 12–28)
BUN: 14 mg/dL (ref 8–27)
Bilirubin Total: 0.6 mg/dL (ref 0.0–1.2)
CO2: 25 mmol/L (ref 20–29)
Calcium: 9.3 mg/dL (ref 8.7–10.3)
Chloride: 93 mmol/L — ABNORMAL LOW (ref 96–106)
Creatinine, Ser: 0.82 mg/dL (ref 0.57–1.00)
GFR calc Af Amer: 89 mL/min/{1.73_m2} (ref >59)
GFR calc non Af Amer: 77 mL/min/{1.73_m2} (ref >59)
Globulin, Total: 2.4 g/dL (ref 1.5–4.5)
Glucose: 315 mg/dL — ABNORMAL HIGH (ref 65–99)
Potassium: 4.3 mmol/L (ref 3.5–5.2)
Sodium: 135 mmol/L (ref 134–144)
Total Protein: 6.7 g/dL (ref 6.0–8.5)

## 2018-08-28 LAB — URIC ACID: Uric Acid: 5.9 mg/dL (ref 2.5–7.1)

## 2018-08-29 NOTE — Telephone Encounter (Signed)
Left message for patient to call and schedule 6 mos telehealth visit with Dr. Crenshaw 

## 2018-08-30 ENCOUNTER — Telehealth: Payer: Self-pay

## 2018-08-30 NOTE — Telephone Encounter (Signed)
-----   Message from Mike Gip, FNP sent at 08/30/2018 12:18 PM EDT ----- Please let patient know that her kidney function was normal. Her uric acid levels were also in normal range.

## 2018-08-30 NOTE — Telephone Encounter (Signed)
Called patient, advised that kidney function and uric acid levels were in normal range. Patient verbalized understanding.   Patient is asking what to do now, she states she is still in pain. States it feels like "a hot poker sticking in my foot" Please advise. Thanks!

## 2018-08-30 NOTE — Progress Notes (Signed)
Please let patient know that her kidney function was normal. Her uric acid levels were also in normal range.

## 2018-08-31 ENCOUNTER — Other Ambulatory Visit: Payer: Self-pay

## 2018-08-31 ENCOUNTER — Encounter (HOSPITAL_COMMUNITY): Payer: Self-pay | Admitting: Emergency Medicine

## 2018-08-31 ENCOUNTER — Emergency Department (HOSPITAL_COMMUNITY): Payer: PRIVATE HEALTH INSURANCE

## 2018-08-31 ENCOUNTER — Emergency Department (HOSPITAL_COMMUNITY)
Admission: EM | Admit: 2018-08-31 | Discharge: 2018-08-31 | Disposition: A | Discharge status: Home / Self Care | Payer: PRIVATE HEALTH INSURANCE | Attending: Emergency Medicine | Admitting: Emergency Medicine

## 2018-08-31 ENCOUNTER — Ambulatory Visit (INDEPENDENT_AMBULATORY_CARE_PROVIDER_SITE_OTHER)
Admission: EM | Admit: 2018-08-31 | Discharge: 2018-08-31 | Disposition: A | Discharge status: Home / Self Care | Payer: PRIVATE HEALTH INSURANCE | Source: Home / Self Care | Attending: Family Medicine | Admitting: Family Medicine

## 2018-08-31 DIAGNOSIS — I2510 Atherosclerotic heart disease of native coronary artery without angina pectoris: Secondary | ICD-10-CM

## 2018-08-31 DIAGNOSIS — I70222 Atherosclerosis of native arteries of extremities with rest pain, left leg: Secondary | ICD-10-CM

## 2018-08-31 DIAGNOSIS — Z87891 Personal history of nicotine dependence: Secondary | ICD-10-CM | POA: Insufficient documentation

## 2018-08-31 DIAGNOSIS — I739 Peripheral vascular disease, unspecified: Secondary | ICD-10-CM

## 2018-08-31 DIAGNOSIS — Z7982 Long term (current) use of aspirin: Secondary | ICD-10-CM | POA: Insufficient documentation

## 2018-08-31 DIAGNOSIS — Z7984 Long term (current) use of oral hypoglycemic drugs: Secondary | ICD-10-CM | POA: Insufficient documentation

## 2018-08-31 DIAGNOSIS — Z79899 Other long term (current) drug therapy: Secondary | ICD-10-CM | POA: Insufficient documentation

## 2018-08-31 DIAGNOSIS — E119 Type 2 diabetes mellitus without complications: Secondary | ICD-10-CM

## 2018-08-31 DIAGNOSIS — M79672 Pain in left foot: Secondary | ICD-10-CM

## 2018-08-31 DIAGNOSIS — I10 Essential (primary) hypertension: Secondary | ICD-10-CM

## 2018-08-31 DIAGNOSIS — I252 Old myocardial infarction: Secondary | ICD-10-CM | POA: Insufficient documentation

## 2018-08-31 DIAGNOSIS — I998 Other disorder of circulatory system: Secondary | ICD-10-CM

## 2018-08-31 LAB — CBC WITH DIFFERENTIAL/PLATELET
Abs Immature Granulocytes: 0.05 10*3/uL (ref 0.00–0.07)
Basophils Absolute: 0.1 10*3/uL (ref 0.0–0.1)
Basophils Relative: 1 %
Eosinophils Absolute: 0.3 10*3/uL (ref 0.0–0.5)
Eosinophils Relative: 3 %
HCT: 40.5 % (ref 36.0–46.0)
Hemoglobin: 13.8 g/dL (ref 12.0–15.0)
Immature Granulocytes: 1 %
Lymphocytes Relative: 19 %
Lymphs Abs: 1.8 10*3/uL (ref 0.7–4.0)
MCH: 29.7 pg (ref 26.0–34.0)
MCHC: 34.1 g/dL (ref 30.0–36.0)
MCV: 87.3 fL (ref 80.0–100.0)
Monocytes Absolute: 0.4 10*3/uL (ref 0.1–1.0)
Monocytes Relative: 4 %
Neutro Abs: 7 10*3/uL (ref 1.7–7.7)
Neutrophils Relative %: 72 %
Platelets: 241 10*3/uL (ref 150–400)
RBC: 4.64 MIL/uL (ref 3.87–5.11)
RDW: 14.9 % (ref 11.5–15.5)
WBC: 9.6 10*3/uL (ref 4.0–10.5)
nRBC: 0 % (ref 0.0–0.2)

## 2018-08-31 LAB — BASIC METABOLIC PANEL WITH GFR
Anion gap: 13 (ref 5–15)
BUN: 13 mg/dL (ref 8–23)
CO2: 28 mmol/L (ref 22–32)
Calcium: 9.5 mg/dL (ref 8.9–10.3)
Chloride: 96 mmol/L — ABNORMAL LOW (ref 98–111)
Creatinine, Ser: 0.77 mg/dL (ref 0.44–1.00)
GFR calc Af Amer: >60 mL/min
GFR calc non Af Amer: >60 mL/min
Glucose, Bld: 296 mg/dL — ABNORMAL HIGH (ref 70–99)
Potassium: 3.8 mmol/L (ref 3.5–5.1)
Sodium: 137 mmol/L (ref 135–145)

## 2018-08-31 MED ORDER — IOHEXOL 350 MG/ML SOLN
125.0000 mL | Freq: Once | INTRAVENOUS | Status: AC | PRN
  Administered 2018-08-31: 16:00:00 125 mL via INTRAVENOUS

## 2018-08-31 MED ORDER — HYDROCODONE-ACETAMINOPHEN 5-325 MG PO TABS: 1.0000 | ORAL_TABLET | Freq: Four times a day (QID) | ORAL | 0 refills | Status: DC | PRN

## 2018-08-31 MED ORDER — MORPHINE SULFATE (PF) 4 MG/ML IV SOLN
4.0000 mg | Freq: Once | INTRAVENOUS | Status: DC
  Filled 2018-08-31: qty 1

## 2018-08-31 NOTE — ED Triage Notes (Signed)
Pt in with c/o L foot pain, extremity cold and red. Has absent pedal pulse, sent by UC. Pt is being seen by vasc surgeon for circulatory problems and trx for gout currently.

## 2018-08-31 NOTE — ED Notes (Signed)
Dr. Patria Mane notified of pt's HTN

## 2018-08-31 NOTE — Discharge Instructions (Addendum)
Take norco as directed as needed for pain. Use warm compresses and elevate the leg to help with pain and swelling. Follow up with Dr. Bosie Helper office this coming week for ongoing management of your foot pain. Return to the ER for emergent changes or worsening symptoms.   Also, your blood pressure was elevated today. Eat a low salt/low sodium diet, and take all of your home blood pressure medications as directed. Keep a log of your blood pressure readings from every morning and evening (making sure to give yourself at least 15 minutes of rest prior to checking it) and take it to your doctor's office at your next appointment for ongoing management of your blood pressure. Stay well hydrated and get plenty of rest. Avoid caffeine and other over the counter products that would make your blood pressure go up (such as decongestants, excedrin, etc). Follow up with your regular doctor in 1 week for recheck of symptoms and ongoing management of your blood pressure. Return to the ER for emergent changes or worsening symptoms.

## 2018-08-31 NOTE — ED Triage Notes (Signed)
Pt here for left foot pain chronic in nature with redness worse over last couple of days

## 2018-08-31 NOTE — Discharge Instructions (Addendum)
Please go to the ER for further evaluation °

## 2018-08-31 NOTE — ED Notes (Signed)
Patient verbalizes understanding of discharge instructions. Opportunity for questioning and answers were provided. Armband removed by staff, pt discharged from ED via wheelchair to home.  

## 2018-08-31 NOTE — Consult Note (Addendum)
Hospital Consult    Reason for Consult:  Left foot pain Requesting Physician:  Shaune Leeks MRN #:  [id number]  History of Present Illness: This is a 63 y.o. female who presents to the ED today with rest pain in the left foot.  She states that this has been going on for at least 3 weeks.  She thought that she had gout and she has been going to her PCP and has received solu-medrol and lotrisone cream.  They checked her uric acid and it was normal and asked her to go to the urgent care.  There, they were unable to locate pulses in her foot and was directed to the ER.  She states that nothing makes the pain better and it is constant.  She also c/o pain in her toes.    She did have a CTA with runoff, which revealed a high grade SFA occlusion on the left as well as a 7cm long occlusion of the distal SFA extending into the popliteal artery.    The pt is on a statin for cholesterol management.  The pt is on a daily aspirin.   Other AC:  none The pt is on CCB, BB and ACEI for hypertension.   The pt is diabetic.  On oral agents Tobacco hx:  Remote-quit January 2019  Past Medical History:  Diagnosis Date  . CAD (coronary artery disease)   . Diabetes mellitus without complication (HCC)   . DM (diabetes mellitus), type 2, uncontrolled with complications (HCC) 04/06/2012  . Hypertension   . Hypertensive emergency 11/14/2012  . Hypokalemia 11/14/2012  . Hyponatremia 04/08/2012  . Mixed hyperlipidemia 04/06/2012  . NSTEMI (non-ST elevated myocardial infarction) (HCC) 11/14/2012  . Tobacco abuse 04/06/2012    Past Surgical History:  Procedure Laterality Date  . LEFT HEART CATHETERIZATION WITH CORONARY ANGIOGRAM N/A 11/14/2012   Procedure: LEFT HEART CATHETERIZATION WITH CORONARY ANGIOGRAM;  Surgeon: Pamella Pert, MD;  Location: [facility] CATH LAB;  Service: Cardiovascular;  Laterality: N/A;  . PERCUTANEOUS CORONARY STENT INTERVENTION (PCI-S)  11/14/2012   Procedure: PERCUTANEOUS CORONARY STENT  INTERVENTION (PCI-S);  Surgeon: Pamella Pert, MD;  Location: [facility] CATH LAB;  Service: Cardiovascular;;  . TONSILLECTOMY      Allergies  Allergen Reactions  . Penicillins Rash    Did it involve swelling of the face/tongue/throat, SOB, or low BP? No Did it involve sudden or severe rash/hives, skin peeling, or any reaction on the inside of your mouth or nose? Yes Did you need to seek medical attention at a hospital or doctor's office? Yes When did it last happen? Unknown If all above answers are "NO", may proceed with cephalosporin use.    Prior to Admission medications   Medication Sig Start Date End Date Taking? Authorizing Provider  amLODipine (NORVASC) 5 MG tablet Take 1 tablet (5 mg total) by mouth daily. Patient taking differently: Take 5 mg by mouth every evening.  03/16/18 08/31/18 Yes Mike Gip, FNP  aspirin 81 MG chewable tablet Chew 81 mg by mouth daily.   Yes [provider]  atorvastatin (LIPITOR) 80 MG tablet Take 1 tablet (80 mg total) by mouth daily at 6 PM. 06/21/18 06/16/19 Yes Mike Gip, FNP  glyBURIDE (DIABETA) 5 MG tablet Take 2 tablets (10 mg total) by mouth daily with breakfast. 11/30/17 08/31/18 Yes Mike Gip, FNP  KRILL OIL 1000 MG CAPS Take 1 capsule by mouth daily.   Yes [provider]  lisinopril-hydrochlorothiazide (PRINZIDE,ZESTORETIC) 20-25 MG tablet Take 1 tablet by  mouth daily. Patient taking differently: Take 1 tablet by mouth every evening.  06/18/18  Yes Mike Gipouglas, Andre, FNP  metFORMIN (GLUCOPHAGE) 1000 MG tablet Take 1 tablet (1,000 mg total) by mouth 2 (two) times daily with a meal. 03/16/18  Yes Mike Gipouglas, Andre, FNP  metoprolol tartrate (LOPRESSOR) 25 MG tablet Take 1 tablet (25 mg total) by mouth 2 (two) times daily. 12/14/17 08/31/18 Yes Mike Gipouglas, Andre, FNP  clotrimazole-betamethasone (LOTRISONE) cream Apply 1 application topically 2 (two) times daily. Patient not taking: Reported on 08/31/2018 08/27/18   Mike Gipouglas, Andre, FNP   omega-3 acid ethyl esters (LOVAZA) 1 g capsule Take 2 capsules (2 g total) by mouth 2 (two) times daily. Patient not taking: Reported on 08/31/2018 06/21/18 12/18/18  Mike Gipouglas, Andre, FNP    Social History   Socioeconomic History  . Marital status: Single    Spouse name: Not on file  . Number of children: Not on file  . Years of education: Not on file  . Highest education level: Not on file  Occupational History  . Not on file  Social Needs  . Financial resource strain: Not on file  . Food insecurity:    Worry: Not on file    Inability: Not on file  . Transportation needs:    Medical: Not on file    Non-medical: Not on file  Tobacco Use  . Smoking status: Former Smoker    Packs/day: 1.00    Types: Cigarettes  . Smokeless tobacco: Never Used  Substance and Sexual Activity  . Alcohol use: Yes    Comment: occ  . Drug use: No  . Sexual activity: Not on file  Lifestyle  . Physical activity:    Days per week: Not on file    Minutes per session: Not on file  . Stress: Not on file  Relationships  . Social connections:    Talks on phone: Not on file    Gets together: Not on file    Attends religious service: Not on file    Active member of club or organization: Not on file    Attends meetings of clubs or organizations: Not on file    Relationship status: Not on file  . Intimate partner violence:    Fear of current or ex partner: Not on file    Emotionally abused: Not on file    Physically abused: Not on file    Forced sexual activity: Not on file  Other Topics Concern  . Not on file  Social History Narrative  . Not on file     Family History  Problem Relation Age of Onset  . Diabetes Brother   . Heart disease Brother   . Arthritis Brother   . Diabetes Father   . Heart disease Father     ROS: [x]  Positive   [ ]  Negative   [ ]  All sytems reviewed and are negative  Cardiac: []  chest pain/pressure []  palpitations []  SOB lying flat []  DOE  Vascular: [x]  pain  in legs while walking-left [x]  pain in legs at rest-left [x]  pain in legs at night-left []  non-healing ulcers []  hx of DVT [x]  swelling in legs  Pulmonary: []  productive cough []  asthma/wheezing []  home O2  Neurologic: []  weakness in []  arms []  legs []  numbness in []  arms []  legs []  hx of CVA []  mini stroke [] difficulty speaking or slurred speech []  temporary loss of vision in one eye []  dizziness  Hematologic: []  hx of cancer []  bleeding problems []  problems  with blood clotting easily  Endocrine:   [x]  diabetes []  thyroid disease  GI []  vomiting blood []  blood in stool  GU: []  CKD/renal failure []  HD--[]  M/W/F or []  T/T/S  Psychiatric: []  anxiety []  depression  Musculoskeletal: []  arthritis []  joint pain  Integumentary: []  rashes []  ulcers  Constitutional: []  fever []  chills   Physical Examination  Vitals:   08/31/18 1230 08/31/18 1315  BP: (!) 223/86 (!) 220/81  Pulse: 72 66  Resp:    Temp:    SpO2: 99% 97%   Body mass index is 27.47 kg/m.  General:  WDWN in NAD Gait: Not observed HENT: WNL, normocephalic Pulmonary: normal non-labored breathing, without Rales, rhonchi,  wheezing Cardiac: regular Abdomen:  soft, NT/ND, no masses Skin: without rashes Vascular Exam/Pulses:  Right Left  Radial 2+ (normal) 2+ (normal)  Femoral 2+ (normal) 2+ (normal)  Popliteal 2+ (normal) absent  DP absent absent  PT absent absent  Peroneal Monophasic doppler signal Monophasic doppler signal   Extremities: ischemic changes to left 2nd toe; left foot is hyperemic; toes tender to touch  Musculoskeletal: no muscle wasting or atrophy  Neurologic: A&O X 3;  No focal weakness or paresthesias are detected; speech is fluent/normal Psychiatric:  The pt has Normal affect.  CBC    Component Value Date/Time   WBC 9.6 08/31/2018 1411   RBC 4.64 08/31/2018 1411   HGB 13.8 08/31/2018 1411   HGB 11.9 11/30/2017 1351   HCT 40.5 08/31/2018 1411   HCT 36.7  11/30/2017 1351   PLT 241 08/31/2018 1411   PLT 190 11/30/2017 1351   MCV 87.3 08/31/2018 1411   MCV 92 11/30/2017 1351   MCH 29.7 08/31/2018 1411   MCHC 34.1 08/31/2018 1411   RDW 14.9 08/31/2018 1411   RDW 16.5 (H) 11/30/2017 1351   LYMPHSABS 1.8 08/31/2018 1411   LYMPHSABS 1.4 11/30/2017 1351   MONOABS 0.4 08/31/2018 1411   EOSABS 0.3 08/31/2018 1411   EOSABS 0.1 11/30/2017 1351   BASOSABS 0.1 08/31/2018 1411   BASOSABS 0.0 11/30/2017 1351    BMET    Component Value Date/Time   NA 137 08/31/2018 1411   NA 135 08/27/2018 1141   K 3.8 08/31/2018 1411   CL 96 (L) 08/31/2018 1411   CO2 28 08/31/2018 1411   GLUCOSE 296 (H) 08/31/2018 1411   BUN 13 08/31/2018 1411   BUN 14 08/27/2018 1141   CREATININE 0.77 08/31/2018 1411   CALCIUM 9.5 08/31/2018 1411   GFRNONAA >60 08/31/2018 1411   GFRAA >60 08/31/2018 1411    COAGS: Lab Results  Component Value Date   INR 0.94 11/13/2012     Non-Invasive Vascular Imaging:   CTA aortobifemoral with runoff 08/31/2018: IMPRESSION: VASCULAR  1. Multifocal left lower extremity peripheral arterial disease as detailed above. In short, there are at least 2 focal moderate to high-grade stenoses in the left superficial femoral artery as well as a chronic or subacute 7 cm long occlusion in the distal superficial femoral artery extending into the P1 segment of the popliteal artery. This does not appear to be embolic in nature. 2. 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 Additionally, the posterior tibial arteries appear congenitally hypoplastic and are occluded bilaterally. Thus, the patient has single-vessel runoff to the ankles in the form of the peroneal artery bilaterally. 3. 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 4. Small penetrating atherosclerotic ulcers in the left common iliac  artery. 5. 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 6.  Aortic Atherosclerosis (ICD10-170.0). 7. Coronary artery disease.  NON-VASCULAR  1. No acute abnormality in the abdomen or pelvis. 2. Cholelithiasis.   ASSESSMENT/PLAN: This is a 62 y.o. female with rest pain left foot   -will have her come back on Tuesday to undergo an aortogram with runoff with possible intervention, however, where the blockage is, an intervention may not be possible.  She may need a left femoral to popliteal bypass grafting.  Her saphenous vein on the surface appears to be useable if she does require a bypass.   -Dr. Andry Bogden discussed with her should she need a bypass, it will be several weeks before she could return to work given the physical nature of her job.   -continue statin/asa -ER provider to write for pain medication until her procedure.    Samantha Rhyne, PA-C [facility] [phone]  I have examined the patient, reviewed and agree with above.  Patient has clear-cut left foot rest pain.  CT angiogram was discussed with the patient.  Had occlusion of her left popliteal artery with reconstitution of peroneal runoff.  Has anatomic variation with peroneal runoff only bilaterally.  Will discharge to home and readmitted for outpatient aortogram with runoff and possible intervention Socorro Kanitz next week  Renette Hsu, MD 08/31/2018 6:15 PM  

## 2018-08-31 NOTE — ED Provider Notes (Signed)
[facility] [place] EMERGENCY DEPARTMENT Provider Note   CSN: [id number] Arrival date & time: 08/31/18  1015    History   Chief Complaint Chief Complaint  Patient presents with   Foot Pain   Absent pedal pulse    HPI    Rebecca [name] is a 63 y.o. female with a PMHx of HTN, CAD/NSTEMI, DM2, tobacco abuse, gout, and other conditions listed below, who presents to the ED with complaints of left foot pain that initially began a month ago.  She states that initially she had some left great toe pain that felt like a gout flare.  She stated that the next week, about 3 weeks ago, her entire left foot started becoming red, swollen, and painful so she decided to go to her PCP.  On 08/15/2018, chart review reveals that she was seen by her PCP and was treated with naproxen for presumptive gout flare.  She states that the pain continued despite taking naproxen.  On 08/27/2018 she returned to her PCP, they gave her a shot of Solu-Medrol 125 mg and gave her Lotrisone cream, performed a uric acid level which was 5.9.  They called her today and told her that her uric acid level was normal and that this was not a gout flare, and recommended that she go see an urgent care.  She was seen by urgent care just prior to arrival, they were unable to find pulses in the left foot so they referred her here for further evaluation.  She describes her pain as 10/10 constant burning and occasional sharp nonradiating pain in the left foot toes all except for the fourth toe, worse with palpation or weightbearing, and unrelieved with naproxen and Lotrisone cream.  She has not tried anything additional for her pain.  She reports associated erythema of the foot and toes, swelling, and reports that they feel cool to the touch.  She also mentions having some paresthesias in her left foot as well as some claudication that is been going on for several months.  She states that her legs hurt when she walks more than 30 feet.  She  has never seen a vascular surgeon.  Her P[facility].  She denies any warmth of the foot, also denies having any fevers, chills, chest pain, shortness of breath, headache, vision changes, abdominal pain, nausea, vomiting, diarrhea, constipation, dysuria, hematuria, focal weakness, or any other complaints at this time.  Of note, she did take her home morning blood pressure medicines, but she has not taken her afternoon/evening ones.  Also of note, pt reports she quit smoking in Jan 2019.   The history is provided by the patient and medical records. No language interpreter was used.  Foot Pain  Pertinent negatives include no chest pain, no abdominal pain, no headaches and no shortness of breath.    Past Medical History:  Diagnosis Date   CAD (coronary artery disease)    Diabetes mellitus without complication (HCC)    DM (diabetes mellitus), type 2, uncontrolled with complications (HCC) 04/06/2012   Hypertension    Hypertensive emergency 11/14/2012   Hypokalemia 11/14/2012   Hyponatremia 04/08/2012   Mixed hyperlipidemia 04/06/2012   NSTEMI (non-ST elevated myocardial infarction) (HCC) 11/14/2012   Tobacco abuse 04/06/2012    Patient Active Problem List   Diagnosis Date Noted   Hypertensive emergency 11/14/2012   Hypokalemia 11/14/2012   NSTEMI (non-ST elevated myocardial infarction) (HCC) 11/14/2012   Hyponatremia 04/08/2012  Hypertension 04/06/2012   Chest pain 04/06/2012   DM (diabetes mellitus), type 2, uncontrolled with complications (HCC) 04/06/2012   Tobacco abuse 04/06/2012   Mixed hyperlipidemia 04/06/2012    Past Surgical History:  Procedure Laterality Date   LEFT HEART CATHETERIZATION WITH CORONARY ANGIOGRAM N/A 11/14/2012   Procedure: LEFT HEART CATHETERIZATION WITH CORONARY ANGIOGRAM;  Surgeon: Pamella PertJagadeesh R Ganji, MD;  [facility];  Service: Cardiovascular;  Laterality: N/A;   PERCUTANEOUS CORONARY STENT  INTERVENTION (PCI-S)  11/14/2012   Procedure: PERCUTANEOUS CORONARY STENT INTERVENTION (PCI-S);  Surgeon: Pamella PertJagadeesh R Ganji, MD;  [facility];  Service: Cardiovascular;;   TONSILLECTOMY       OB History   No obstetric history on file.      Home Medications    Prior to Admission medications   Medication Sig Start Date End Date Taking? Authorizing Provider  amLODipine (NORVASC) 5 MG tablet Take 1 tablet (5 mg total) by mouth daily. Patient taking differently: Take 5 mg by mouth every evening.  03/16/18 08/31/18 Yes Mike Gipouglas, Andre, FNP  aspirin 81 MG chewable tablet Chew 81 mg by mouth daily.   Yes [provider]  atorvastatin (LIPITOR) 80 MG tablet Take 1 tablet (80 mg total) by mouth daily at 6 PM. 06/21/18 06/16/19 Yes Mike Gipouglas, Andre, FNP  glyBURIDE (DIABETA) 5 MG tablet Take 2 tablets (10 mg total) by mouth daily with breakfast. 11/30/17 08/31/18 Yes Mike Gipouglas, Andre, FNP  KRILL OIL 1000 MG CAPS Take 1 capsule by mouth daily.   Yes [provider]  lisinopril-hydrochlorothiazide (PRINZIDE,ZESTORETIC) 20-25 MG tablet Take 1 tablet by mouth daily. Patient taking differently: Take 1 tablet by mouth every evening.  06/18/18  Yes Mike Gipouglas, Andre, FNP  metFORMIN (GLUCOPHAGE) 1000 MG tablet Take 1 tablet (1,000 mg total) by mouth 2 (two) times daily with a meal. 03/16/18  Yes Mike Gipouglas, Andre, FNP  metoprolol tartrate (LOPRESSOR) 25 MG tablet Take 1 tablet (25 mg total) by mouth 2 (two) times daily. 12/14/17 08/31/18 Yes Mike Gipouglas, Andre, FNP  clotrimazole-betamethasone (LOTRISONE) cream Apply 1 application topically 2 (two) times daily. Patient not taking: Reported on 08/31/2018 08/27/18   Mike Gipouglas, Andre, FNP  omega-3 acid ethyl esters (LOVAZA) 1 g capsule Take 2 capsules (2 g total) by mouth 2 (two) times daily. Patient not taking: Reported on 08/31/2018 06/21/18 12/18/18  Mike Gipouglas, Andre, FNP    Family History Family History  Problem Relation Age of Onset   Diabetes Brother     Heart disease Brother    Arthritis Brother    Diabetes Father    Heart disease Father     Social History Social History   Tobacco Use   Smoking status: Former Smoker    Packs/day: 1.00    Types: Cigarettes   Smokeless tobacco: Never Used  Substance Use Topics   Alcohol use: Yes    Comment: occ   Drug use: No     Allergies   Penicillins   Review of Systems Review of Systems  Constitutional: Negative for chills and fever.  Eyes: Negative for visual disturbance.  Respiratory: Negative for shortness of breath.   Cardiovascular: Negative for chest pain.  Gastrointestinal: Negative for abdominal pain, constipation, diarrhea, nausea and vomiting.  Genitourinary: Negative for dysuria and hematuria.  Musculoskeletal: Positive for arthralgias, joint swelling and myalgias.  Skin: Negative for color change.  Allergic/Immunologic: Positive for immunocompromised state (DM2).  Neurological: Negative for weakness, numbness and headaches.  Psychiatric/Behavioral: Negative for confusion.   All other systems reviewed and are  negative for acute change except as noted in the HPI.    Physical Exam Updated Vital Signs BP (!) 220/81    Pulse 66    Temp 98.2 F (36.8 C) (Oral)    Resp 14    Ht  (1.626 m)    Wt 72.6 kg    SpO2 97%    BMI 27.47 kg/m   Physical Exam Vitals signs and nursing note reviewed.  Constitutional:      General: She is not in acute distress.    Appearance: Normal appearance. She is well-developed. She is not toxic-appearing.     Comments: Afebrile, nontoxic, NAD, HTN noted  HENT:     Head: Normocephalic and atraumatic.  Eyes:     General:        Right eye: No discharge.        Left eye: No discharge.     Conjunctiva/sclera: Conjunctivae normal.  Neck:     Musculoskeletal: Normal range of motion and neck supple.  Cardiovascular:     Rate and Rhythm: Normal rate and regular rhythm.     Pulses:          Femoral pulses are 2+ on the right side and  2+ on the left side.      Dorsalis pedis pulses are 2+ on the right side and 0 on the left side.       Posterior tibial pulses are 0 on the left side.     Heart sounds: Normal heart sounds, S1 normal and S2 normal. No murmur. No friction rub. No gallop.      Comments: Bilateral femoral pulses 2+ to palpation. R DP pulse 2+ to palpation. L DP and PT pulses absent to palpation and unable to detect with doppler. L Popliteal pulse dopplerable.  R leg with trace pretibial edema, warm and well perfused appearing.  L leg without swelling or edema, foot and ankle hyperemic. Toes exquisitely TTP diffusely. Foot warm but toes cold. Wiggles toes without difficulty. Sensation grossly intact.  Pulmonary:     Effort: Pulmonary effort is normal. No respiratory distress.     Breath sounds: Normal breath sounds. No decreased breath sounds, wheezing, rhonchi or rales.  Abdominal:     General: Bowel sounds are normal. There is no distension.     Palpations: Abdomen is soft. Abdomen is not rigid.     Tenderness: There is no abdominal tenderness. There is no right CVA tenderness, left CVA tenderness, guarding or rebound. Negative signs include Murphy's sign and McBurney's sign.  Musculoskeletal: Normal range of motion.     Left foot: Normal range of motion. Tenderness present. No swelling.     Comments: See Cardio exam above for description of extremities  Skin:    General: Skin is warm and dry.     Findings: No rash.  Neurological:     Mental Status: She is alert and oriented to person, place, and time.     Sensory: Sensation is intact. No sensory deficit.     Motor: Motor function is intact.  Psychiatric:        Mood and Affect: Mood and affect normal.        Behavior: Behavior normal.      ED Treatments / Results  Labs (all labs ordered are listed, but only abnormal results are displayed) Labs Reviewed  BASIC METABOLIC PANEL - Abnormal; Notable for the following components:      Result Value    Chloride 96 (*)  Glucose, Bld 296 (*)    All other components within normal limits  CBC WITH DIFFERENTIAL/PLATELET    EKG None  Radiology Ct Angio Aortobifemoral W And/or Wo Contrast  Result Date: 08/31/2018 CLINICAL DATA:  63 year old female with left foot pain and decreased pulses. EXAM: CT ANGIOGRAPHY OF ABDOMINAL AORTA WITH ILIOFEMORAL RUNOFF TECHNIQUE: Multidetector CT imaging of the abdomen, pelvis and lower extremities was performed using the standard protocol during bolus administration of intravenous contrast. Multiplanar CT image reconstructions and MIPs were obtained to evaluate the vascular anatomy. CONTRAST:  OMNIPAQUE IOHEXOL 350 MG/ML SOLN COMPARISON:  Prior CT scan of the chest 04/07/2012 FINDINGS: VASCULAR Aorta: Heterogeneous atherosclerotic plaque visualized along the abdominal aorta. No evidence of aneurysm or dissection. Celiac: Patent without evidence of aneurysm, dissection, vasculitis or significant stenosis. SMA: Patent without evidence of aneurysm, dissection, vasculitis or significant stenosis. Renals: Both renal arteries are patent without evidence of aneurysm, dissection, vasculitis, fibromuscular dysplasia or significant stenosis. IMA: Patent without evidence of aneurysm, dissection, vasculitis or significant stenosis. RIGHT Lower Extremity Inflow: Focal short segment non flow limiting dissection in the distal right common iliac artery. Heterogeneous fibrofatty plaque in the proximal common iliac artery results in mild stenosis. The internal iliac artery is patent. The external iliac artery is relatively spared from disease. Outflow: Mild fibrofatty atherosclerotic plaque along the posterior common femoral artery without focal stenosis. The profunda femoral artery is widely patent. The superficial femoral artery is mildly diseased but remains patent without significant stenosis. There is a moderate focal stenosis in the P1 segment of the popliteal artery. Runoff:  Variant runoff anatomy. No definite anterior tibial artery is visualized. It may be congenitally hypoplastic as there appears to be peroneal continuation. The posterior tibial artery occludes in the upper calf. The dorsalis pedis and common plantar arteries are patent via collateral flow. LEFT Lower Extremity Inflow: Heterogeneous and irregular atherosclerotic plaque with small penetrating atherosclerotic ulcers in the proximal common iliac artery. No focal stenosis. The internal iliac artery is chronically occluded. The external iliac artery is spared from disease. Outflow: Widely patent common femoral and profunda femoral arteries. The superficial femoral artery is diffusely diseased with predominantly fibrofatty atherosclerotic plaque. There is a focal high-grade stenosis in the upper thigh with a second focal high-grade stenosis in the mid thigh. The artery then abruptly occludes at the abductor canal. There is a hypertrophic collateral suggesting that this is not an acute process. The artery remains occluded into the P1 segment of the popliteal artery where it reconstitutes. The popliteal artery is then diffusely disease, again with predominantly fibrofatty atherosclerotic plaque. Runoff: Similar anatomy without a true anterior tibial artery. There is peroneal continuation with single-vessel runoff to the ankle. Veins: No focal venous abnormality. Review of the MIP images confirms the above findings. NON-VASCULAR Lower chest: No acute abnormality. Incompletely imaged cardiomegaly. Calcifications along the coronary arteries. No pericardial effusion. Unremarkable distal thoracic esophagus. Hepatobiliary: Normal hepatic contour and morphology. No discrete hepatic lesion. Stones are present within the gallbladder consistent with cholelithiasis. No intra or extrahepatic biliary ductal dilatation. Pancreas: Unremarkable. No pancreatic ductal dilatation or surrounding inflammatory changes. Spleen: Normal in size  without focal abnormality. Adrenals/Urinary Tract: Normal adrenal glands. No evidence of hydronephrosis, nephrolithiasis or enhancing renal mass. The ureters and bladder are unremarkable. Stomach/Bowel: No evidence of obstruction or focal bowel wall thickening. Normal appendix in the right lower quadrant. The terminal ileum is unremarkable. Lymphatic: No suspicious lymphadenopathy. Reproductive: Uterus and bilateral adnexa are unremarkable. Other: No abdominal wall hernia  or abnormality. No abdominopelvic ascites. Musculoskeletal: No acute fracture or aggressive appearing lytic or blastic osseous lesion. IMPRESSION: VASCULAR 1. Multifocal left lower extremity peripheral arterial disease as detailed above. In short, there are at least 2 focal moderate to high-grade stenoses in the left superficial femoral artery as well as a chronic or subacute 7 cm long occlusion in the distal superficial femoral artery extending into the P1 segment of the popliteal artery. This does not appear to be embolic in nature. 2. 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 Additionally, the posterior tibial arteries appear congenitally hypoplastic and are occluded bilaterally. Thus, the patient has single-vessel runoff to the ankles in the form of the peroneal artery bilaterally. 3. 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 4. Small penetrating atherosclerotic ulcers in the left common iliac artery. 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 6.  Aortic Atherosclerosis (ICD10-170.0). 7. Coronary artery disease. NON-VASCULAR 1. No acute abnormality in the abdomen or pelvis. 2. Cholelithiasis. Signed, Sterling Big, MD, RPVI [facility] Radiology Electronically Signed   By: Malachy Moan M.D.   On: 08/31/2018 17:11    Procedures Procedures (including  critical care time)  Medications Ordered in ED Medications  morphine 4 MG/ML injection 4 mg (0 mg Intravenous Hold 08/31/18 1518)  iohexol (OMNIPAQUE) 350 MG/ML injection 125 mL (125 mLs Intravenous Contrast Given 08/31/18 1619)     Initial Impression / Assessment and Plan / ED Course  I have reviewed the triage vital signs and the nursing notes.  Pertinent labs & imaging results that were available during my care of the patient were reviewed by me and considered in my medical decision making (see chart for details).        63 y.o. female here with severe left foot pain x3 weeks, was treated presumptively for gout but uric acid level returned normal a few days ago. Was sent to [facility] by PCP, found to not have pulses in L foot so sent here. On exam, strong femoral pulses bilaterally, R DP pulse palpable and strong, L DP and PT pulses not palpable and not dopplerable, able to doppler popliteal pulse in L leg; L foot warm to touch except for digits which feel cool, foot hyperemic up to the ankle, no swelling appreciated, exquisite tenderness to the toes diffusely, sensation grossly intact. R leg with trace pretibial edema but feels warm and appears well perfused. Suspect chronic occlusion, pt reporting claudication for some time. Will get basic labs and reach out to vascular surgeon on call. Will give pain meds and reassess. Discussed case with my attending Dr. Patria Mane who agrees with plan. Of note, pt with elevated BP, reports taking meds this morning, likely elevated related to pain, pt asymptomatic otherwise, doubt need for emergent work up of this. Will monitor.   2:40 PM Samantha PA-C of Vascular surgery returning page, wants CTA aortobifemoral with runoffs and they will see her in consultation when they're done in the OR. Awaiting labs. Will reassess shortly.   6:09 PM Pt declined wanting pain meds that were ordered. CBC w/diff WNL. BMP with gluc 296 but otherwise WNL, no anion gap or abnormal  bicarb. CTA aortobifemoral with multifocal L lower extremity PAD in multiple areas, with variant runoff anatomy bilaterally with congenitally hypoplastic anterior tibial arteries and peroneal continuation and posterior tibial arteries also appear congenitally hypoplastic and are occluded bilaterally, nonflow limiting short segment  chronic dissection in right common iliac artery, with small penetrating atherosclerotic ulcers in left common iliac, chronic occlusion of left internal iliac artery. Dr. Arbie Cookey of vascular surgery coming down to see the pt, states she can go home with pain meds, will follow up in the office this week. Advised DASH diet for HTN and continuation of her home BP meds. Discussed to avoid tobacco use, pt reports she quit last year. I explained the diagnosis and have given explicit precautions to return to the ER including for any other new or worsening symptoms. The patient understands and accepts the medical plan as it's been dictated and I have answered their questions. Discharge instructions concerning home care and prescriptions have been given. The patient is STABLE and is discharged to home in good condition.    Final Clinical Impressions(s) / ED Diagnoses   Final diagnoses:  Lower limb ischemia  Left foot pain  Claudication [facility])  Essential hypertension    ED Discharge Orders         Ordered    HYDROcodone-acetaminophen (NORCO) 5-325 MG tablet  Every 6 hours PRN     08/31/18 [address] [place], Cordelia Poche 08/31/18 1809    Azalia Bilis, MD 08/31/18 2154

## 2018-08-31 NOTE — ED Provider Notes (Signed)
[facility]    CSN: [id number] Arrival date & time: 08/31/18  5449     History   Chief Complaint Chief Complaint  Patient presents with  . Foot Pain    HPI Rebecca [name] is a 63 y.o. female.   Patient is a 63 year old female with past medical history of coronary artery disease, diabetes, hypertension, and STEMI, tobacco abuse.  She presents today with worsening left foot pain, redness.  She has been treated for gout multiple times in the last couple weeks without any relief.  Reports that the redness is gotten worse and the pain is worse especially when she is at work standing for long hours.  She also has swelling in the right foot without redness.  Denies any pain in that foot. She has tried NSAIDs, cherry juice and been given steroid injections without any relief.  She was also given Lotrisone cream which made the pain worse.  She describes the pain as sharp and burning.  No fevers, chills.  No injuries to the foot.  ROS per HPI      Past Medical History:  Diagnosis Date  . CAD (coronary artery disease)   . Diabetes mellitus without complication (HCC)   . DM (diabetes mellitus), type 2, uncontrolled with complications (HCC) 04/06/2012  . Hypertension   . Hypertensive emergency 11/14/2012  . Hypokalemia 11/14/2012  . Hyponatremia 04/08/2012  . Mixed hyperlipidemia 04/06/2012  . NSTEMI (non-ST elevated myocardial infarction) (HCC) 11/14/2012  . Tobacco abuse 04/06/2012    Patient Active Problem List   Diagnosis Date Noted  . Hypertensive emergency 11/14/2012  . Hypokalemia 11/14/2012  . NSTEMI (non-ST elevated myocardial infarction) (HCC) 11/14/2012  . Hyponatremia 04/08/2012  . Hypertension 04/06/2012  . Chest pain 04/06/2012  . DM (diabetes mellitus), type 2, uncontrolled with complications (HCC) 04/06/2012  . Tobacco abuse 04/06/2012  . Mixed hyperlipidemia 04/06/2012    Past Surgical History:  Procedure Laterality Date  . LEFT HEART CATHETERIZATION WITH  CORONARY ANGIOGRAM N/A 11/14/2012   Procedure: LEFT HEART CATHETERIZATION WITH CORONARY ANGIOGRAM;  Surgeon: Pamella Pert, MD;  Location: [facility] CATH LAB;  Service: Cardiovascular;  Laterality: N/A;  . PERCUTANEOUS CORONARY STENT INTERVENTION (PCI-S)  11/14/2012   Procedure: PERCUTANEOUS CORONARY STENT INTERVENTION (PCI-S);  Surgeon: Pamella Pert, MD;  Location: [facility] CATH LAB;  Service: Cardiovascular;;  . TONSILLECTOMY      OB History   No obstetric history on file.      Home Medications    Prior to Admission medications   Medication Sig Start Date End Date Taking? Authorizing Provider  amLODipine (NORVASC) 5 MG tablet Take 1 tablet (5 mg total) by mouth daily. 03/16/18 08/27/18  Mike Gip, FNP  aspirin 81 MG chewable tablet Chew 81 mg by mouth daily.    [provider]  atorvastatin (LIPITOR) 80 MG tablet Take 1 tablet (80 mg total) by mouth daily at 6 PM. 06/21/18 06/16/19  Mike Gip, FNP  BucAlfAspKGlucCouchParsUvaUrJu (NATURAL WATER PILLS) TABS Take 1 tablet by mouth daily.    [provider]  clotrimazole-betamethasone (LOTRISONE) cream Apply 1 application topically 2 (two) times daily. 08/27/18   Mike Gip, FNP  glyBURIDE (DIABETA) 5 MG tablet Take 2 tablets (10 mg total) by mouth daily with breakfast. 11/30/17 08/27/18  Mike Gip, FNP  KRILL OIL 1000 MG CAPS Take 1 capsule by mouth daily.    [provider]  lisinopril-hydrochlorothiazide (PRINZIDE,ZESTORETIC) 20-25 MG tablet Take 1 tablet by mouth daily. 06/18/18   Mike Gip, FNP  metFORMIN (GLUCOPHAGE) 1000 MG tablet Take 1 tablet (1,000 mg total) by mouth 2 (two) times daily with a meal. 03/16/18   Mike Gip, FNP  metoprolol tartrate (LOPRESSOR) 25 MG tablet Take 1 tablet (25 mg total) by mouth 2 (two) times daily. 12/14/17 08/27/18  Mike Gip, FNP  omega-3 acid ethyl esters (LOVAZA) 1 g capsule Take 2 capsules (2 g total) by mouth 2 (two) times daily. Patient not taking:  Reported on 08/27/2018 06/21/18 12/18/18  Mike Gip, FNP    Family History Family History  Problem Relation Age of Onset  . Diabetes Brother   . Heart disease Brother   . Arthritis Brother   . Diabetes Father   . Heart disease Father     Social History Social History   Tobacco Use  . Smoking status: Former Smoker    Packs/day: 1.00    Types: Cigarettes  . Smokeless tobacco: Never Used  Substance Use Topics  . Alcohol use: Yes    Comment: occ  . Drug use: No     Allergies   Penicillins   Review of Systems Review of Systems   Physical Exam Triage Vital Signs ED Triage Vitals  Enc Vitals Group     BP 08/31/18 0930 (!) 176/91     Pulse Rate 08/31/18 0930 72     Resp 08/31/18 0930 18     Temp 08/31/18 0930 (!) 97.5 F (36.4 C)     Temp Source 08/31/18 0930 Oral     SpO2 08/31/18 0930 95 %     Weight --      Height --      Head Circumference --      Peak Flow --      Pain Score 08/31/18 0931 8     Pain Loc --      Pain Edu? --      Excl. in GC? --    No data found.  Updated Vital Signs BP (!) 176/91 (BP Location: Right Arm)   Pulse 72   Temp (!) 97.5 F (36.4 C) (Oral)   Resp 18   SpO2 95%   Visual Acuity Right Eye Distance:   Left Eye Distance:   Bilateral Distance:    Right Eye Near:   Left Eye Near:    Bilateral Near:     Physical Exam Vitals signs and nursing note reviewed.  Constitutional:      General: She is not in acute distress.    Appearance: Normal appearance. She is not ill-appearing, toxic-appearing or diaphoretic.  HENT:     Head: Normocephalic and atraumatic.     Nose: Nose normal.  Eyes:     Conjunctiva/sclera: Conjunctivae normal.  Neck:     Musculoskeletal: Normal range of motion.  Cardiovascular:     Pulses: Decreased pulses.          Dorsalis pedis pulses are detected w/ Doppler on the right side and 0 on the left side.  Pulmonary:     Effort: Pulmonary effort is normal.  Musculoskeletal: Normal range of  motion.     Right lower leg: 1+ Edema present.  Skin:    Findings: Erythema present.  Neurological:     Mental Status: She is alert.  Psychiatric:        Mood and Affect: Mood normal.          UC Treatments / Results  Labs (all labs ordered are listed, but only abnormal results are displayed) Labs Reviewed - No data to  display  EKG None  Radiology No results found.  Procedures Procedures (including critical care time)  Medications Ordered in UC Medications - No data to display  Initial Impression / Assessment and Plan / UC Course  I have reviewed the triage vital signs and the nursing notes.  Pertinent labs & imaging results that were available during my care of the patient were reviewed by me and considered in my medical decision making (see chart for details).    Foot pain  Patient is a 63 year old female with CAD, diabetes, hypertension STEMI.  She has had worsening foot pain over the last month. Been treated for gout twice with no relief. Physical exam reveals redness to the left foot and toes. Tender with touch.  Not consistent with gout.  Unable to detect pedal pulse with Doppler on the left. Bruit heard on the right Most likely diagnosis is peripheral arterial disease.  She needs ABIs and vascular consult.  I am sending her to the ER for further evaluation management  Final Clinical Impressions(s) / UC Diagnoses   Final diagnoses:  Foot pain, left  PAD (peripheral artery disease) [facility](HCC)     Discharge Instructions     Please go to the ER for further evaluation    ED Prescriptions    None     Controlled Substance Prescriptions  Controlled Substance Registry consulted? Not Applicable   Janace ArisBast, Simi Briel A, NP 08/31/18 1020

## 2018-08-31 NOTE — ED Notes (Signed)
Patient transported to CT 

## 2018-08-31 NOTE — ED Notes (Signed)
EDP and doppler at bedside

## 2018-08-31 NOTE — H&P (View-Only) (Signed)
Hospital Consult    Reason for Consult:  Left foot pain Requesting Physician:  Shaune Leeks MRN #:  [id number]  History of Present Illness: This is a 63 y.o. female who presents to the ED today with rest pain in the left foot.  She states that this has been going on for at least 3 weeks.  She thought that she had gout and she has been going to her PCP and has received solu-medrol and lotrisone cream.  They checked her uric acid and it was normal and asked her to go to the urgent care.  There, they were unable to locate pulses in her foot and was directed to the ER.  She states that nothing makes the pain better and it is constant.  She also c/o pain in her toes.    She did have a CTA with runoff, which revealed a high grade SFA occlusion on the left as well as a 7cm long occlusion of the distal SFA extending into the popliteal artery.    The pt is on a statin for cholesterol management.  The pt is on a daily aspirin.   Other AC:  none The pt is on CCB, BB and ACEI for hypertension.   The pt is diabetic.  On oral agents Tobacco hx:  Remote-quit January 2019  Past Medical History:  Diagnosis Date  . CAD (coronary artery disease)   . Diabetes mellitus without complication (HCC)   . DM (diabetes mellitus), type 2, uncontrolled with complications (HCC) 04/06/2012  . Hypertension   . Hypertensive emergency 11/14/2012  . Hypokalemia 11/14/2012  . Hyponatremia 04/08/2012  . Mixed hyperlipidemia 04/06/2012  . NSTEMI (non-ST elevated myocardial infarction) (HCC) 11/14/2012  . Tobacco abuse 04/06/2012    Past Surgical History:  Procedure Laterality Date  . LEFT HEART CATHETERIZATION WITH CORONARY ANGIOGRAM N/A 11/14/2012   Procedure: LEFT HEART CATHETERIZATION WITH CORONARY ANGIOGRAM;  Surgeon: Pamella Pert, MD;  Location: [facility] CATH LAB;  Service: Cardiovascular;  Laterality: N/A;  . PERCUTANEOUS CORONARY STENT INTERVENTION (PCI-S)  11/14/2012   Procedure: PERCUTANEOUS CORONARY STENT  INTERVENTION (PCI-S);  Surgeon: Pamella Pert, MD;  Location: [facility] CATH LAB;  Service: Cardiovascular;;  . TONSILLECTOMY      Allergies  Allergen Reactions  . Penicillins Rash    Did it involve swelling of the face/tongue/throat, SOB, or low BP? No Did it involve sudden or severe rash/hives, skin peeling, or any reaction on the inside of your mouth or nose? Yes Did you need to seek medical attention at a hospital or doctor's office? Yes When did it last happen? Unknown If all above answers are "NO", may proceed with cephalosporin use.    Prior to Admission medications   Medication Sig Start Date End Date Taking? Authorizing Provider  amLODipine (NORVASC) 5 MG tablet Take 1 tablet (5 mg total) by mouth daily. Patient taking differently: Take 5 mg by mouth every evening.  03/16/18 08/31/18 Yes Mike Gip, FNP  aspirin 81 MG chewable tablet Chew 81 mg by mouth daily.   Yes [provider]  atorvastatin (LIPITOR) 80 MG tablet Take 1 tablet (80 mg total) by mouth daily at 6 PM. 06/21/18 06/16/19 Yes Mike Gip, FNP  glyBURIDE (DIABETA) 5 MG tablet Take 2 tablets (10 mg total) by mouth daily with breakfast. 11/30/17 08/31/18 Yes Mike Gip, FNP  KRILL OIL 1000 MG CAPS Take 1 capsule by mouth daily.   Yes [provider]  lisinopril-hydrochlorothiazide (PRINZIDE,ZESTORETIC) 20-25 MG tablet Take 1 tablet by  mouth daily. Patient taking differently: Take 1 tablet by mouth every evening.  06/18/18  Yes Mike Gipouglas, Andre, FNP  metFORMIN (GLUCOPHAGE) 1000 MG tablet Take 1 tablet (1,000 mg total) by mouth 2 (two) times daily with a meal. 03/16/18  Yes Mike Gipouglas, Andre, FNP  metoprolol tartrate (LOPRESSOR) 25 MG tablet Take 1 tablet (25 mg total) by mouth 2 (two) times daily. 12/14/17 08/31/18 Yes Mike Gipouglas, Andre, FNP  clotrimazole-betamethasone (LOTRISONE) cream Apply 1 application topically 2 (two) times daily. Patient not taking: Reported on 08/31/2018 08/27/18   Mike Gipouglas, Andre, FNP   omega-3 acid ethyl esters (LOVAZA) 1 g capsule Take 2 capsules (2 g total) by mouth 2 (two) times daily. Patient not taking: Reported on 08/31/2018 06/21/18 12/18/18  Mike Gipouglas, Andre, FNP    Social History   Socioeconomic History  . Marital status: Single    Spouse name: Not on file  . Number of children: Not on file  . Years of education: Not on file  . Highest education level: Not on file  Occupational History  . Not on file  Social Needs  . Financial resource strain: Not on file  . Food insecurity:    Worry: Not on file    Inability: Not on file  . Transportation needs:    Medical: Not on file    Non-medical: Not on file  Tobacco Use  . Smoking status: Former Smoker    Packs/day: 1.00    Types: Cigarettes  . Smokeless tobacco: Never Used  Substance and Sexual Activity  . Alcohol use: Yes    Comment: occ  . Drug use: No  . Sexual activity: Not on file  Lifestyle  . Physical activity:    Days per week: Not on file    Minutes per session: Not on file  . Stress: Not on file  Relationships  . Social connections:    Talks on phone: Not on file    Gets together: Not on file    Attends religious service: Not on file    Active member of club or organization: Not on file    Attends meetings of clubs or organizations: Not on file    Relationship status: Not on file  . Intimate partner violence:    Fear of current or ex partner: Not on file    Emotionally abused: Not on file    Physically abused: Not on file    Forced sexual activity: Not on file  Other Topics Concern  . Not on file  Social History Narrative  . Not on file     Family History  Problem Relation Age of Onset  . Diabetes Brother   . Heart disease Brother   . Arthritis Brother   . Diabetes Father   . Heart disease Father     ROS: [x]  Positive   [ ]  Negative   [ ]  All sytems reviewed and are negative  Cardiac: []  chest pain/pressure []  palpitations []  SOB lying flat []  DOE  Vascular: [x]  pain  in legs while walking-left [x]  pain in legs at rest-left [x]  pain in legs at night-left []  non-healing ulcers []  hx of DVT [x]  swelling in legs  Pulmonary: []  productive cough []  asthma/wheezing []  home O2  Neurologic: []  weakness in []  arms []  legs []  numbness in []  arms []  legs []  hx of CVA []  mini stroke [] difficulty speaking or slurred speech []  temporary loss of vision in one eye []  dizziness  Hematologic: []  hx of cancer []  bleeding problems []  problems  with blood clotting easily  Endocrine:   [x]  diabetes []  thyroid disease  GI []  vomiting blood []  blood in stool  GU: []  CKD/renal failure []  HD--[]  M/W/F or []  T/T/S  Psychiatric: []  anxiety []  depression  Musculoskeletal: []  arthritis []  joint pain  Integumentary: []  rashes []  ulcers  Constitutional: []  fever []  chills   Physical Examination  Vitals:   08/31/18 1230 08/31/18 1315  BP: (!) 223/86 (!) 220/81  Pulse: 72 66  Resp:    Temp:    SpO2: 99% 97%   Body mass index is 27.47 kg/m.  General:  WDWN in NAD Gait: Not observed HENT: WNL, normocephalic Pulmonary: normal non-labored breathing, without Rales, rhonchi,  wheezing Cardiac: regular Abdomen:  soft, NT/ND, no masses Skin: without rashes Vascular Exam/Pulses:  Right Left  Radial 2+ (normal) 2+ (normal)  Femoral 2+ (normal) 2+ (normal)  Popliteal 2+ (normal) absent  DP absent absent  PT absent absent  Peroneal Monophasic doppler signal Monophasic doppler signal   Extremities: ischemic changes to left 2nd toe; left foot is hyperemic; toes tender to touch  Musculoskeletal: no muscle wasting or atrophy  Neurologic: A&O X 3;  No focal weakness or paresthesias are detected; speech is fluent/normal Psychiatric:  The pt has Normal affect.  CBC    Component Value Date/Time   WBC 9.6 08/31/2018 1411   RBC 4.64 08/31/2018 1411   HGB 13.8 08/31/2018 1411   HGB 11.9 11/30/2017 1351   HCT 40.5 08/31/2018 1411   HCT 36.7  11/30/2017 1351   PLT 241 08/31/2018 1411   PLT 190 11/30/2017 1351   MCV 87.3 08/31/2018 1411   MCV 92 11/30/2017 1351   MCH 29.7 08/31/2018 1411   MCHC 34.1 08/31/2018 1411   RDW 14.9 08/31/2018 1411   RDW 16.5 (H) 11/30/2017 1351   LYMPHSABS 1.8 08/31/2018 1411   LYMPHSABS 1.4 11/30/2017 1351   MONOABS 0.4 08/31/2018 1411   EOSABS 0.3 08/31/2018 1411   EOSABS 0.1 11/30/2017 1351   BASOSABS 0.1 08/31/2018 1411   BASOSABS 0.0 11/30/2017 1351    BMET    Component Value Date/Time   NA 137 08/31/2018 1411   NA 135 08/27/2018 1141   K 3.8 08/31/2018 1411   CL 96 (L) 08/31/2018 1411   CO2 28 08/31/2018 1411   GLUCOSE 296 (H) 08/31/2018 1411   BUN 13 08/31/2018 1411   BUN 14 08/27/2018 1141   CREATININE 0.77 08/31/2018 1411   CALCIUM 9.5 08/31/2018 1411   GFRNONAA >60 08/31/2018 1411   GFRAA >60 08/31/2018 1411    COAGS: Lab Results  Component Value Date   INR 0.94 11/13/2012     Non-Invasive Vascular Imaging:   CTA aortobifemoral with runoff 08/31/2018: IMPRESSION: VASCULAR  1. Multifocal left lower extremity peripheral arterial disease as detailed above. In short, there are at least 2 focal moderate to high-grade stenoses in the left superficial femoral artery as well as a chronic or subacute 7 cm long occlusion in the distal superficial femoral artery extending into the P1 segment of the popliteal artery. This does not appear to be embolic in nature. 2. 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 Additionally, the posterior tibial arteries appear congenitally hypoplastic and are occluded bilaterally. Thus, the patient has single-vessel runoff to the ankles in the form of the peroneal artery bilaterally. 3. 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 4. Small penetrating atherosclerotic ulcers in the left common iliac  artery. 5. 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 6.  Aortic Atherosclerosis (ICD10-170.0). 7. Coronary artery disease.  NON-VASCULAR  1. No acute abnormality in the abdomen or pelvis. 2. Cholelithiasis.   ASSESSMENT/PLAN: This is a 63 y.o. female with rest pain left foot   -will have her come back on Tuesday to undergo an aortogram with runoff with possible intervention, however, where the blockage is, an intervention may not be possible.  She may need a left femoral to popliteal bypass grafting.  Her saphenous vein on the surface appears to be useable if she does require a bypass.   -Dr. Arbie Cookey discussed with her should she need a bypass, it will be several weeks before she could return to work given the physical nature of her job.   -continue statin/asa -ER provider to write for pain medication until her procedure.    Doreatha Massed, PA-C [facility] [phone]  I have examined the patient, reviewed and agree with above.  Patient has clear-cut left foot rest pain.  CT angiogram was discussed with the patient.  Had occlusion of her left popliteal artery with reconstitution of peroneal runoff.  Has anatomic variation with peroneal runoff only bilaterally.  Will discharge to home and readmitted for outpatient aortogram with runoff and possible intervention Lurline Caver next week  Gretta Began, MD 08/31/2018 6:15 PM

## 2018-08-31 NOTE — Telephone Encounter (Signed)
Patient went to the ED

## 2018-09-03 ENCOUNTER — Other Ambulatory Visit: Payer: Self-pay | Admitting: *Deleted

## 2018-09-03 NOTE — Progress Notes (Signed)
Call to patient and instructed to be at [facility] admitting at 10 am on 09/05/2018 for procedure. Must have a driver and caregiver for discharge to home and visitor restrictions. Hold pm dose Metformin on 09/04/2018 and NPO past MN. 09/05/2018 take Amlodipine and Metoprolol with sips of water. Hold all other medications until after this procedure. Verbalized understanding.

## 2018-09-05 ENCOUNTER — Observation Stay (HOSPITAL_COMMUNITY)
Admission: RE | Admit: 2018-09-05 | Discharge: 2018-09-06 | Disposition: A | Discharge status: Home / Self Care | Payer: Self-pay | Attending: Surgery | Admitting: Surgery

## 2018-09-05 ENCOUNTER — Encounter (HOSPITAL_COMMUNITY)
Admission: RE | Disposition: A | Discharge status: Home / Self Care | Payer: Self-pay | Source: Home / Self Care | Attending: Surgery

## 2018-09-05 ENCOUNTER — Other Ambulatory Visit: Payer: Self-pay

## 2018-09-05 DIAGNOSIS — Z880 Allergy status to penicillin: Secondary | ICD-10-CM | POA: Insufficient documentation

## 2018-09-05 DIAGNOSIS — I70222 Atherosclerosis of native arteries of extremities with rest pain, left leg: Secondary | ICD-10-CM | POA: Insufficient documentation

## 2018-09-05 DIAGNOSIS — K8020 Calculus of gallbladder without cholecystitis without obstruction: Secondary | ICD-10-CM | POA: Insufficient documentation

## 2018-09-05 DIAGNOSIS — Z833 Family history of diabetes mellitus: Secondary | ICD-10-CM | POA: Insufficient documentation

## 2018-09-05 DIAGNOSIS — I745 Embolism and thrombosis of iliac artery: Secondary | ICD-10-CM | POA: Insufficient documentation

## 2018-09-05 DIAGNOSIS — I252 Old myocardial infarction: Secondary | ICD-10-CM | POA: Insufficient documentation

## 2018-09-05 DIAGNOSIS — E1151 Type 2 diabetes mellitus with diabetic peripheral angiopathy without gangrene: Principal | ICD-10-CM | POA: Insufficient documentation

## 2018-09-05 DIAGNOSIS — Z7984 Long term (current) use of oral hypoglycemic drugs: Secondary | ICD-10-CM | POA: Insufficient documentation

## 2018-09-05 DIAGNOSIS — Z87891 Personal history of nicotine dependence: Secondary | ICD-10-CM | POA: Insufficient documentation

## 2018-09-05 DIAGNOSIS — I739 Peripheral vascular disease, unspecified: Secondary | ICD-10-CM | POA: Diagnosis present

## 2018-09-05 DIAGNOSIS — Z8249 Family history of ischemic heart disease and other diseases of the circulatory system: Secondary | ICD-10-CM | POA: Insufficient documentation

## 2018-09-05 DIAGNOSIS — Z79899 Other long term (current) drug therapy: Secondary | ICD-10-CM | POA: Insufficient documentation

## 2018-09-05 DIAGNOSIS — I701 Atherosclerosis of renal artery: Secondary | ICD-10-CM | POA: Insufficient documentation

## 2018-09-05 DIAGNOSIS — I10 Essential (primary) hypertension: Secondary | ICD-10-CM | POA: Insufficient documentation

## 2018-09-05 DIAGNOSIS — Z955 Presence of coronary angioplasty implant and graft: Secondary | ICD-10-CM | POA: Insufficient documentation

## 2018-09-05 DIAGNOSIS — Z7982 Long term (current) use of aspirin: Secondary | ICD-10-CM | POA: Insufficient documentation

## 2018-09-05 DIAGNOSIS — I700 Atherosclerosis of aorta: Secondary | ICD-10-CM | POA: Insufficient documentation

## 2018-09-05 DIAGNOSIS — E782 Mixed hyperlipidemia: Secondary | ICD-10-CM | POA: Insufficient documentation

## 2018-09-05 DIAGNOSIS — I2510 Atherosclerotic heart disease of native coronary artery without angina pectoris: Secondary | ICD-10-CM | POA: Insufficient documentation

## 2018-09-05 HISTORY — PX: LOWER EXTREMITY ANGIOGRAPHY: CATH118251

## 2018-09-05 LAB — POCT I-STAT 4, (NA,K, GLUC, HGB,HCT)
Glucose, Bld: 192 mg/dL — ABNORMAL HIGH (ref 70–99)
HCT: 36 % (ref 36.0–46.0)
Hemoglobin: 12.2 g/dL (ref 12.0–15.0)
Potassium: 4.5 mmol/L (ref 3.5–5.1)
Sodium: 131 mmol/L — ABNORMAL LOW (ref 135–145)

## 2018-09-05 LAB — POCT I-STAT CREATININE: Creatinine, Ser: 0.7 mg/dL (ref 0.44–1.00)

## 2018-09-05 SURGERY — LOWER EXTREMITY ANGIOGRAPHY
Anesthesia: LOCAL

## 2018-09-05 MED ORDER — ATORVASTATIN CALCIUM 80 MG PO TABS
80.0000 mg | ORAL_TABLET | Freq: Every day | ORAL | Status: DC
  Administered 2018-09-05: 80 mg via ORAL
  Filled 2018-09-05: qty 1

## 2018-09-05 MED ORDER — HEPARIN (PORCINE) IN NACL 1000-0.9 UT/500ML-% IV SOLN
INTRAVENOUS | Status: AC
  Filled 2018-09-05: qty 1000

## 2018-09-05 MED ORDER — SODIUM CHLORIDE 0.9 % IV SOLN: INTRAVENOUS | Status: DC

## 2018-09-05 MED ORDER — GLYBURIDE 5 MG PO TABS
10.0000 mg | ORAL_TABLET | Freq: Every day | ORAL | Status: DC
  Administered 2018-09-06: 10 mg via ORAL
  Filled 2018-09-05: qty 2

## 2018-09-05 MED ORDER — ASPIRIN 81 MG PO CHEW
81.0000 mg | CHEWABLE_TABLET | Freq: Every day | ORAL | Status: DC
  Administered 2018-09-05 – 2018-09-06 (×2): 81 mg via ORAL
  Filled 2018-09-05 (×2): qty 1

## 2018-09-05 MED ORDER — PHENOL 1.4 % MT LIQD: 1.0000 | OROMUCOSAL | Status: DC | PRN

## 2018-09-05 MED ORDER — BISACODYL 10 MG RE SUPP: 10.0000 mg | Freq: Every day | RECTAL | Status: DC | PRN

## 2018-09-05 MED ORDER — SODIUM CHLORIDE 0.9 % IV SOLN
INTRAVENOUS | Status: DC
  Administered 2018-09-05: 11:00:00 via INTRAVENOUS

## 2018-09-05 MED ORDER — PANTOPRAZOLE SODIUM 40 MG PO TBEC
40.0000 mg | DELAYED_RELEASE_TABLET | Freq: Every day | ORAL | Status: DC
  Administered 2018-09-05 – 2018-09-06 (×2): 40 mg via ORAL
  Filled 2018-09-05 (×2): qty 1

## 2018-09-05 MED ORDER — LIDOCAINE HCL (PF) 1 % IJ SOLN
INTRAMUSCULAR | Status: DC | PRN
  Administered 2018-09-05: 30 mL

## 2018-09-05 MED ORDER — SODIUM CHLORIDE 0.9% FLUSH
3.0000 mL | Freq: Two times a day (BID) | INTRAVENOUS | Status: DC
  Administered 2018-09-05 – 2018-09-06 (×2): 3 mL via INTRAVENOUS

## 2018-09-05 MED ORDER — HYDRALAZINE HCL 20 MG/ML IJ SOLN: 5.0000 mg | INTRAMUSCULAR | Status: DC | PRN

## 2018-09-05 MED ORDER — HEPARIN (PORCINE) IN NACL 1000-0.9 UT/500ML-% IV SOLN
INTRAVENOUS | Status: DC | PRN
  Administered 2018-09-05: 500 mL

## 2018-09-05 MED ORDER — IODIXANOL 320 MG/ML IV SOLN
INTRAVENOUS | Status: DC | PRN
  Administered 2018-09-05: 13:00:00 175 mL via INTRA_ARTERIAL

## 2018-09-05 MED ORDER — METOPROLOL TARTRATE 25 MG PO TABS
25.0000 mg | ORAL_TABLET | Freq: Two times a day (BID) | ORAL | Status: DC
  Administered 2018-09-05 – 2018-09-06 (×2): 25 mg via ORAL
  Filled 2018-09-05 (×2): qty 1

## 2018-09-05 MED ORDER — AMLODIPINE BESYLATE 5 MG PO TABS: 5.0000 mg | ORAL_TABLET | Freq: Every evening | ORAL | Status: DC

## 2018-09-05 MED ORDER — POLYETHYLENE GLYCOL 3350 17 G PO PACK: 17.0000 g | PACK | Freq: Every day | ORAL | Status: DC | PRN

## 2018-09-05 MED ORDER — GUAIFENESIN-DM 100-10 MG/5ML PO SYRP: 15.0000 mL | ORAL_SOLUTION | ORAL | Status: DC | PRN

## 2018-09-05 MED ORDER — LABETALOL HCL 5 MG/ML IV SOLN: 10.0000 mg | INTRAVENOUS | Status: DC | PRN

## 2018-09-05 MED ORDER — SODIUM CHLORIDE 0.9 % IV SOLN: 250.0000 mL | INTRAVENOUS | Status: DC | PRN

## 2018-09-05 MED ORDER — LIDOCAINE HCL (PF) 1 % IJ SOLN
INTRAMUSCULAR | Status: AC
  Filled 2018-09-05: qty 30

## 2018-09-05 MED ORDER — MIDAZOLAM HCL 2 MG/2ML IJ SOLN
INTRAMUSCULAR | Status: DC | PRN
  Administered 2018-09-05: 2 mg via INTRAVENOUS

## 2018-09-05 MED ORDER — MORPHINE SULFATE (PF) 10 MG/ML IV SOLN: 2.0000 mg | INTRAVENOUS | Status: DC | PRN

## 2018-09-05 MED ORDER — SODIUM CHLORIDE 0.9 % WEIGHT BASED INFUSION: 1.0000 mL/kg/h | INTRAVENOUS | Status: AC

## 2018-09-05 MED ORDER — ONDANSETRON HCL 4 MG/2ML IJ SOLN: 4.0000 mg | Freq: Four times a day (QID) | INTRAMUSCULAR | Status: DC | PRN

## 2018-09-05 MED ORDER — MIDAZOLAM HCL 2 MG/2ML IJ SOLN
INTRAMUSCULAR | Status: AC
  Filled 2018-09-05: qty 2

## 2018-09-05 MED ORDER — HYDROCODONE-ACETAMINOPHEN 5-325 MG PO TABS
1.0000 | ORAL_TABLET | Freq: Four times a day (QID) | ORAL | Status: DC | PRN
  Administered 2018-09-05 – 2018-09-06 (×3): 1 via ORAL
  Filled 2018-09-05 (×3): qty 1

## 2018-09-05 MED ORDER — ACETAMINOPHEN 325 MG PO TABS: 650.0000 mg | ORAL_TABLET | ORAL | Status: DC | PRN

## 2018-09-05 MED ORDER — KRILL OIL 1000 MG PO CAPS: 1.0000 | ORAL_CAPSULE | Freq: Every day | ORAL | Status: DC

## 2018-09-05 MED ORDER — SODIUM CHLORIDE 0.9% FLUSH: 3.0000 mL | INTRAVENOUS | Status: DC | PRN

## 2018-09-05 MED ORDER — METOPROLOL TARTRATE 5 MG/5ML IV SOLN: 2.0000 mg | INTRAVENOUS | Status: DC | PRN

## 2018-09-05 MED ORDER — POTASSIUM CHLORIDE CRYS ER 20 MEQ PO TBCR
20.0000 meq | EXTENDED_RELEASE_TABLET | Freq: Once | ORAL | Status: AC
  Administered 2018-09-05: 20 meq via ORAL
  Filled 2018-09-05: qty 1

## 2018-09-05 MED ORDER — MORPHINE SULFATE (PF) 2 MG/ML IV SOLN
2.0000 mg | INTRAVENOUS | Status: DC | PRN
  Administered 2018-09-05: 2 mg via INTRAVENOUS
  Filled 2018-09-05: qty 1

## 2018-09-05 MED ORDER — FENTANYL CITRATE (PF) 100 MCG/2ML IJ SOLN
INTRAMUSCULAR | Status: AC
  Filled 2018-09-05: qty 2

## 2018-09-05 MED ORDER — OXYCODONE HCL 5 MG PO TABS
5.0000 mg | ORAL_TABLET | ORAL | Status: DC | PRN
  Administered 2018-09-05 (×2): 5 mg via ORAL
  Filled 2018-09-05 (×2): qty 1

## 2018-09-05 MED ORDER — FENTANYL CITRATE (PF) 100 MCG/2ML IJ SOLN
INTRAMUSCULAR | Status: DC | PRN
  Administered 2018-09-05: 50 ug via INTRAVENOUS

## 2018-09-05 MED ORDER — ALUM & MAG HYDROXIDE-SIMETH 200-200-20 MG/5ML PO SUSP: 15.0000 mL | ORAL | Status: DC | PRN

## 2018-09-05 SURGICAL SUPPLY — 11 items
CATH OMNI FLUSH 5F 65CM (CATHETERS) ×2 IMPLANT
CLOSURE MYNX CONTROL 5F (Vascular Products) ×2 IMPLANT
DRAPE ZERO GRAVITY STERILE (DRAPES) ×2 IMPLANT
KIT MICROPUNCTURE NIT STIFF (SHEATH) ×2 IMPLANT
KIT PV (KITS) ×2 IMPLANT
SHEATH PINNACLE 5F 10CM (SHEATH) ×2 IMPLANT
SHEATH PROBE COVER 6X72 (BAG) ×2 IMPLANT
SYR MEDRAD MARK V 150ML (SYRINGE) ×2 IMPLANT
TRANSDUCER W/STOPCOCK (MISCELLANEOUS) ×2 IMPLANT
TRAY PV CATH (CUSTOM PROCEDURE TRAY) ×2 IMPLANT
WIRE BENTSON .035X145CM (WIRE) ×2 IMPLANT

## 2018-09-05 NOTE — Interval H&P Note (Signed)
History and Physical Interval Note:  09/05/2018 1:37 PM  Rebecca [name]  has presented today for surgery, with the diagnosis of pad.  The various methods of treatment have been discussed with the patient and family. After consideration of risks, benefits and other options for treatment, the patient has consented to  Procedure(s): LOWER EXTREMITY ANGIOGRAPHY (N/A) as a surgical intervention.  The patient's history has been reviewed, patient examined, no change in status, stable for surgery.  I have reviewed the patient's chart and labs.  Questions were answered to the patient's satisfaction.     Durene Cal

## 2018-09-05 NOTE — Discharge Instructions (Signed)
Femoral Site Care °This sheet gives you information about how to care for yourself after your procedure. Your health care provider may also give you more specific instructions. If you have problems or questions, contact your health care provider. °What can I expect after the procedure? °After the procedure, it is common to have: °· Bruising that usually fades within 1-2 weeks. °· Tenderness at the site. °Follow these instructions at home: °Wound care °· Follow instructions from your health care provider about how to take care of your insertion site. Make sure you: °? Wash your hands with soap and water before you change your bandage (dressing). If soap and water are not available, use hand sanitizer. °? Change your dressing as told by your health care provider. °? Leave stitches (sutures), skin glue, or adhesive strips in place. These skin closures may need to stay in place for 2 weeks or longer. If adhesive strip edges start to loosen and curl up, you may trim the loose edges. Do not remove adhesive strips completely unless your health care provider tells you to do that. °· Do not take baths, swim, or use a hot tub until your health care provider approves. °· You may shower 24-48 hours after the procedure or as told by your health care provider. °? Gently wash the site with plain soap and water. °? Pat the area dry with a clean towel. °? Do not rub the site. This may cause bleeding. °· Do not apply powder or lotion to the site. Keep the site clean and dry. °· Check your femoral site every day for signs of infection. Check for: °? Redness, swelling, or pain. °? Fluid or blood. °? Warmth. °? Pus or a bad smell. °Activity °· For the first 2-3 days after your procedure, or as long as directed: °? Avoid climbing stairs as much as possible. °? Do not squat. °· Do not lift anything that is heavier than 10 lb (4.5 kg), or the limit that you are told, until your health care provider says that it is safe. °· Rest as  directed. °? Avoid sitting for a long time without moving. Get up to take short walks every 1-2 hours. °· Do not drive for 24 hours if you were given a medicine to help you relax (sedative). °General instructions °· Take over-the-counter and prescription medicines only as told by your health care provider. °· Keep all follow-up visits as told by your health care provider. This is important. °Contact a health care provider if you have: °· A fever or chills. °· You have redness, swelling, or pain around your insertion site. °Get help right away if: °· The catheter insertion area swells very fast. °· You pass out. °· You suddenly start to sweat or your skin gets clammy. °· The catheter insertion area is bleeding, and the bleeding does not stop when you hold steady pressure on the area. °· The area near or just beyond the catheter insertion site becomes pale, cool, tingly, or numb. °These symptoms may represent a serious problem that is an emergency. Do not wait to see if the symptoms will go away. Get medical help right away. Call your local emergency services (911 in the [place] Do not drive yourself to the hospital. °Summary °· After the procedure, it is common to have bruising that usually fades within 1-2 weeks. °· Check your femoral site every day for signs of infection. °· Do not lift anything that is heavier than 10 lb (4.5 kg), or the   limit that you are told, until your health care provider says that it is safe. °This information is not intended to replace advice given to you by your health care provider. Make sure you discuss any questions you have with your health care provider. °Document Released: 12/20/2013 Document Revised: 05/01/2017 Document Reviewed: 05/01/2017 °Elsevier Interactive Patient Education © 2019 [employer] ° °

## 2018-09-05 NOTE — Op Note (Signed)
Patient name: Rebecca [name] MRN: [id number] DOB: 29-May-1955 Sex: female  09/05/2018 Pre-operative Diagnosis: left leg rest pain Post-operative diagnosis:  Same Surgeon:  Durene Cal Procedure Performed:  1.  Ultrasound-guided access, right femoral artery  2.  Abdominal aortogram  3.  Bilateral lower extremity runoff  4.  Second-order catheterization  5.  Closure device (Mynx)  6.  Conscious sedation (37 minutes)     Indications: The patient was seen in the emergency room and found to have rest pain in her left leg.  She is here today for angiographic evaluation and possible intervention  Procedure:  The patient was identified in the holding area and taken to room 8.  The patient was then placed supine on the table and prepped and draped in the usual sterile fashion.  A time out was called.  Conscious sedation was administered with the use of IV fentanyl and Versed under continuous physician and nurse monitoring.  Heart rate, blood pressure, and oxygen saturation were continuously monitored.  Total sedation time was 37 minutes.  Ultrasound was used to evaluate the right common femoral artery.  It was patent .  A digital ultrasound image was acquired.  A micropuncture needle was used to access the right common femoral artery under ultrasound guidance.  An 018 wire was advanced without resistance and a micropuncture sheath was placed.  The 018 wire was removed and a benson wire was placed.  The micropuncture sheath was exchanged for a 5 french sheath.  An omniflush catheter was advanced over the wire to the level of L-1.  An abdominal angiogram was obtained.  The Omni Flush catheter was brought down to the aortic bifurcation and bilateral runoff was performed.  Next, using the omniflush catheter and a benson wire, the aortic bifurcation was crossed and the catheter was placed into theleft external iliac artery and additional images of the left leg were obtained.  Findings:   Aortogram: The  right renal artery appears to be patent throughout its course.  There is a greater than 80% stenosis at the origin of the left renal artery.  Irregular appearance of the infrarenal abdominal aorta with small area of ectasia is noted.  It is calcified without stenosis.  Bilateral common and external iliac arteries are patent without significant stenosis.  Right Lower Extremity: The right common femoral profundofemoral artery widely patent.  The superficial femoral artery is calcified but patent throughout its course with several areas of 50% stenosis the below-knee popliteal artery is widely patent.  There is single-vessel runoff via the peroneal artery.  Left Lower Extremity: Left common femoral and profundofemoral artery are patent throughout their course.  The superficial femoral artery is small in caliber and calcified and occludes at the level of the adductor canal with reconstitution of a diseased above-knee popliteal artery.  The below-knee popliteal artery is patent throughout its course with single-vessel runoff via the peroneal artery.  Intervention: Intervention was not recommended given the single-vessel runoff and size of the vessel.  A minx device was used for closure.  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  Because of the single-vessel runoff as well as  a small caliber of the superficial femoral artery, a left common femoral to below-knee popliteal artery bypass graft with saphenous vein was recommended.   Juleen ChinaV. Wells Kathan Kirker, M.D., [facility] [place] Office: [phone] Pager:  [phone]

## 2018-09-06 ENCOUNTER — Encounter (HOSPITAL_COMMUNITY): Payer: Self-pay | Admitting: Surgery

## 2018-09-06 LAB — CBC
HCT: 32.5 % — ABNORMAL LOW (ref 36.0–46.0)
Hemoglobin: 10.8 g/dL — ABNORMAL LOW (ref 12.0–15.0)
MCH: 29.8 pg (ref 26.0–34.0)
MCHC: 33.2 g/dL (ref 30.0–36.0)
MCV: 89.5 fL (ref 80.0–100.0)
Platelets: 225 10*3/uL (ref 150–400)
RBC: 3.63 MIL/uL — ABNORMAL LOW (ref 3.87–5.11)
RDW: 15.1 % (ref 11.5–15.5)
WBC: 6.7 10*3/uL (ref 4.0–10.5)
nRBC: 0 % (ref 0.0–0.2)

## 2018-09-06 LAB — BASIC METABOLIC PANEL
Anion gap: 11 (ref 5–15)
BUN: 15 mg/dL (ref 8–23)
CO2: 28 mmol/L (ref 22–32)
Calcium: 8.7 mg/dL — ABNORMAL LOW (ref 8.9–10.3)
Chloride: 99 mmol/L (ref 98–111)
Creatinine, Ser: 0.84 mg/dL (ref 0.44–1.00)
GFR calc Af Amer: >60 mL/min (ref >60)
GFR calc non Af Amer: >60 mL/min (ref >60)
Glucose, Bld: 210 mg/dL — ABNORMAL HIGH (ref 70–99)
Potassium: 3.9 mmol/L (ref 3.5–5.1)
Sodium: 138 mmol/L (ref 135–145)

## 2018-09-06 LAB — HIV ANTIBODY (ROUTINE TESTING W REFLEX): HIV Screen 4th Generation wRfx: NONREACTIVE

## 2018-09-06 MED ORDER — HYDROCODONE-ACETAMINOPHEN 5-325 MG PO TABS: 1.0000 | ORAL_TABLET | Freq: Four times a day (QID) | ORAL | 0 refills | Status: DC | PRN

## 2018-09-06 NOTE — Progress Notes (Addendum)
[facility] [place]  Subjective  - Left foot pain has been there for a month and she is ready to go to surgery.   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  Because of the single-vessel runoff as well as a small caliber of the superficial femoral artery, a left common femoral to below-knee popliteal artery bypass graft with saphenous vein was recommended.  She is scheduled to have surgery left LE bypass 09/12/2018 with Dr. Arbie Cookey Discharge home today with Norco 5/325 # 30 1 q6 PRN for pain.   Mosetta Pigeon 09/06/2018 7:30 AM --  Laboratory Lab Results: Recent Labs    09/05/18 1049  HGB 12.2  HCT 36.0   BMET Recent Labs    09/05/18 1049 09/05/18 1050  NA 131*  --   K 4.5  --   GLUCOSE 192*  --   CREATININE  --  0.70    COAG Lab Results  Component Value Date   INR 0.94 11/13/2012   No results found for: PTT   I have seen and evaluated the patient. I agree with the PA note as documented above. Right groins looks good.  Plan for left fem distal bypass with Dr. Arbie Cookey next week.  Will discharge home in interim prior to bypass with pain medications.    Cephus Shelling, MD [facility] [place] Office: [phone] Pager: [phone]

## 2018-09-06 NOTE — Progress Notes (Signed)
IV and telemetry discontinued. Patient tolerated well. Discharge instructions reviewed with patient. All questions answered.   Ernestina Columbia, RN

## 2018-09-06 NOTE — H&P (View-Only) (Signed)
[facility] [place]  Subjective  - Left foot pain has been there for a month and she is ready to go to surgery.   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  Because of the single-vessel runoff as well as a small caliber of the superficial femoral artery, a left common femoral to below-knee popliteal artery bypass graft with saphenous vein was recommended.  She is scheduled to have surgery left LE bypass 09/12/2018 with Dr. Early Discharge home today with Norco 5/325 # 30 1 q6 PRN for pain.   Rebecca [name] 09/06/2018 7:30 AM --  Laboratory Lab Results: Recent Labs    09/05/18 1049  HGB 12.2  HCT 36.0   BMET Recent Labs    09/05/18 1049 09/05/18 1050  NA 131*  --   K 4.5  --   GLUCOSE 192*  --   CREATININE  --  0.70    COAG Lab Results  Component Value Date   INR 0.94 11/13/2012   No results found for: PTT   I have seen and evaluated the patient. I agree with the PA note as documented above. Right groins looks good.  Plan for left fem distal bypass with Dr. Early next week.  Will discharge home in interim prior to bypass with pain medications.    Rebecca [name] J. Rebecca Rowand, MD [facility] [place] Office: [phone] Pager: [phone]  

## 2018-09-07 ENCOUNTER — Other Ambulatory Visit: Payer: Self-pay | Admitting: *Deleted

## 2018-09-07 NOTE — Pre-Procedure Instructions (Addendum)
Rebecca [name]  09/07/2018      [facility] - [place] ([place]), [address] - [address] [place] ([place]) [place] [address] Phone: [phone] Fax: [phone]  [facility] [place], [place] - [place] [address] [place] [address] Phone: [phone] Fax: [phone]    Your procedure is scheduled on Wednesday 09-12-18 from 0830-1100  Report to [facility] Admitting at 0630 A.M.  Call this number if you have problems the morning of surgery:  [phone]   Remember:  Do not eat or drink after midnight.     Take these medicines the morning of surgery with A SIP OF WATER :  Amlodipine(Norvasc)             Metoprolol tartrate (Lopressor)              As needed :HYDROcodone-acetaminophen (NORCO)  Follow your surgeon's instructions on when to stop Aspirin.  If no instructions were given by your surgeon then you will need to call the office to get those instructions.    As of today, STOP taking any  Aleve, Naproxen, Ibuprofen, Motrin, Advil, Goody's, BC's, all herbal medications, fish oil, and all vitamins.  WHAT DO I DO ABOUT MY DIABETES MEDICATION?  . DO NOT TAKE METFORMIN AND GLYBURIDE (DIABETA)  the morning of surgery.    The day before surgery take Glyburide (DIABETA) with breakfast.   How to Manage Your Diabetes Before and After Surgery  Why is it important to control my blood sugar before and after surgery? . Improving blood sugar levels before and after surgery helps healing and can limit problems. . A way of improving blood sugar control is eating a healthy diet by: o  Eating less sugar and carbohydrates o  Increasing activity/exercise o  Talking with your doctor about reaching your blood sugar goals . High blood sugars (greater than 180 mg/dL) can raise your risk of infections and slow your recovery, so you will need to focus on controlling your diabetes during the weeks before  surgery. . Make sure that the doctor who takes care of your diabetes knows about your planned surgery including the date and location.  How do I manage my blood sugar before surgery? . Check your blood sugar at least 4 times a day, starting 2 days before surgery, to make sure that the level is not too high or low. o Check your blood sugar the morning of your surgery when you wake up and every 2 hours until you get to the Short Stay unit. . If your blood sugar is less than 70 mg/dL, you will need to treat for low blood sugar: o Do not take insulin. o Treat a low blood sugar (less than 70 mg/dL) with  cup of clear juice (cranberry or apple), 4 glucose tablets, OR glucose gel. o Recheck blood sugar in 15 minutes after treatment (to make sure it is greater than 70 mg/dL). If your blood sugar is not greater than 70 mg/dL on recheck, call [phone] for further instructions. . Report your blood sugar to the short stay nurse when you get to Short Stay.  . If you are admitted to the hospital after surgery: o Your blood sugar will be checked by the staff and you will probably be given insulin after surgery (instead of oral diabetes medicines) to make sure you have good blood sugar levels. o The goal for blood sugar control after surgery is  80-180 mg/dL.   Do not wear jewelry, make-up or nail polish.  Do not wear lotions, powders, or perfumes, or deodorant.  Do not shave 48 hours prior to surgery.  Men may shave face and neck.  Do not bring valuables to the hospital.  [facility] is not responsible for any belongings or valuables.  Contacts, dentures or bridgework may not be worn into surgery.  Leave your suitcase in the car.  After surgery it may be brought to your room.  For patients admitted to the hospital, discharge time will be determined by your treatment team.  Patients discharged the day of surgery will not be allowed to drive home.   Special instructions:   [place]- Preparing For  Surgery  Before surgery, you can play an important role. Because skin is not sterile, your skin needs to be as free of germs as possible. You can reduce the number of germs on your skin by washing with CHG (chlorahexidine gluconate) Soap before surgery.  CHG is an antiseptic cleaner which kills germs and bonds with the skin to continue killing germs even after washing.    Oral Hygiene is also important to reduce your risk of infection.  Remember - BRUSH YOUR TEETH THE MORNING OF SURGERY WITH YOUR REGULAR TOOTHPASTE  Please do not use if you have an allergy to CHG or antibacterial soaps. If your skin becomes reddened/irritated stop using the CHG.  Do not shave (including legs and underarms) for at least 48 hours prior to first CHG shower. It is OK to shave your face.  Please follow these instructions carefully.   1. Shower the NIGHT BEFORE SURGERY and the MORNING OF SURGERY with CHG.   2. If you chose to wash your hair, wash your hair first as usual with your normal shampoo.  3. After you shampoo, rinse your hair and body thoroughly to remove the shampoo.  4. Use CHG as you would any other liquid soap. You can apply CHG directly to the skin and wash gently with a scrungie or a clean washcloth.   5. Apply the CHG Soap to your body ONLY FROM THE NECK DOWN.  Do not use on open wounds or open sores. Avoid contact with your eyes, ears, mouth and genitals (private parts). Wash Face and genitals (private parts)  with your normal soap.  6. Wash thoroughly, paying special attention to the area where your surgery will be performed.  7. Thoroughly rinse your body with warm water from the neck down.  8. DO NOT shower/wash with your normal soap after using and rinsing off the CHG Soap.  9. Pat yourself dry with a CLEAN TOWEL.  10. Wear CLEAN PAJAMAS to bed the night before surgery, wear comfortable clothes the morning of surgery  11. Place CLEAN SHEETS on your bed the night of your first shower and  DO NOT SLEEP WITH PETS.  Day of Surgery:  Do not apply any deodorants/lotions.  Please wear clean clothes to the hospital/surgery center.   Remember to brush your teeth WITH YOUR REGULAR TOOTHPASTE.  Please read over the following fact sheets that you were given. Pain Booklet, Coughing and Deep Breathing, MRSA Information and Surgical Site Infection Prevention

## 2018-09-10 ENCOUNTER — Other Ambulatory Visit (HOSPITAL_COMMUNITY)
Admission: RE | Admit: 2018-09-10 | Discharge: 2018-09-10 | Disposition: A | Discharge status: Home / Self Care | Payer: Self-pay | Source: Ambulatory Visit | Attending: Vascular Surgery | Admitting: Vascular Surgery

## 2018-09-10 ENCOUNTER — Other Ambulatory Visit: Payer: Self-pay

## 2018-09-10 ENCOUNTER — Encounter (HOSPITAL_COMMUNITY): Payer: Self-pay

## 2018-09-10 ENCOUNTER — Encounter (HOSPITAL_COMMUNITY): Payer: Self-pay | Admitting: Physician Assistant

## 2018-09-10 ENCOUNTER — Encounter (HOSPITAL_COMMUNITY)
Admission: RE | Admit: 2018-09-10 | Discharge: 2018-09-10 | Disposition: A | Discharge status: Home / Self Care | Payer: Self-pay | Source: Ambulatory Visit | Attending: Vascular Surgery | Admitting: Vascular Surgery

## 2018-09-10 DIAGNOSIS — Z1159 Encounter for screening for other viral diseases: Secondary | ICD-10-CM | POA: Insufficient documentation

## 2018-09-10 DIAGNOSIS — Z01812 Encounter for preprocedural laboratory examination: Secondary | ICD-10-CM | POA: Insufficient documentation

## 2018-09-10 LAB — TYPE AND SCREEN
ABO/RH(D): A POS
Antibody Screen: NEGATIVE

## 2018-09-10 LAB — SURGICAL PCR SCREEN
MRSA, PCR: NEGATIVE
Staphylococcus aureus: NEGATIVE

## 2018-09-10 LAB — URINALYSIS, ROUTINE W REFLEX MICROSCOPIC
Bilirubin Urine: NEGATIVE
Glucose, UA: NEGATIVE mg/dL
Hgb urine dipstick: NEGATIVE
Ketones, ur: NEGATIVE mg/dL
Leukocytes,Ua: NEGATIVE
Nitrite: NEGATIVE
Protein, ur: NEGATIVE mg/dL
Specific Gravity, Urine: 1.003 — ABNORMAL LOW (ref 1.005–1.030)
pH: 6 (ref 5.0–8.0)

## 2018-09-10 LAB — GLUCOSE, CAPILLARY: Glucose-Capillary: 187 mg/dL — ABNORMAL HIGH (ref 70–99)

## 2018-09-10 LAB — PROTIME-INR
INR: 1.1 (ref 0.8–1.2)
Prothrombin Time: 13.7 seconds (ref 11.4–15.2)

## 2018-09-10 LAB — APTT: aPTT: 35 seconds (ref 24–36)

## 2018-09-10 LAB — ABO/RH: ABO/RH(D): A POS

## 2018-09-10 NOTE — Anesthesia Preprocedure Evaluation (Deleted)
Anesthesia Evaluation    Airway        Dental   Pulmonary former smoker,           Cardiovascular hypertension,      Neuro/Psych    GI/Hepatic   Endo/Other  diabetes  Renal/GU      Musculoskeletal   Abdominal   Peds  Hematology   Anesthesia Other Findings   Reproductive/Obstetrics                             Anesthesia Physical Anesthesia Plan  ASA:   Anesthesia Plan:    Post-op Pain Management:    Induction:   PONV Risk Score and Plan:   Airway Management Planned:   Additional Equipment:   Intra-op Plan:   Post-operative Plan:   Informed Consent:   Plan Discussed with:   Anesthesia Plan Comments: (Follows with cardiology for CAD s/p PCI to circumflex in 2014.  Had catheterization July 2014 that showed ejection fraction 60%.  There was a 99% left circumflex lesion and a 60 to 70% LAD. Cx lesion treated with DES. Last seen by Dr. Jens Som 01/08/2018, stable from CV standpoint at that time, no chest pain, statin was added.   Poorly controlled HTN. Some issues with medication noncompliance per notes. BP on arrival to PAT 176/60. Second reading after sitting for about 20 minutes 156/69. Pt in significant pain which may be contributing. She also has poorly controlled DMII with last A1c 11.8 on 06/18/18.  She understands markedly uncontrolled BP or BG could be cause for cancellation on DOS.  She understands to take medications as prescribed. DM coordinator consulted for postop care. )   Anesthesia Quick Evaluation

## 2018-09-10 NOTE — Discharge Summary (Signed)
[facility] Discharge Summary   Patient ID:  Rebecca [name] MRN: [id number] DOB/AGE: 1955-10-29 63 y.o.  Admit date: 09/05/2018 Discharge date: 09/06/2018 Date of Surgery: 09/05/2018 Surgeon: Surgeon(s): Nada LibmanBrabham, Vance W, MD  Admission Diagnosis: pad  Discharge Diagnoses:  pad  Secondary Diagnoses: Past Medical History:  Diagnosis Date  . CAD (coronary artery disease)   . Diabetes mellitus without complication (HCC)   . DM (diabetes mellitus), type 2, uncontrolled with complications (HCC) 04/06/2012  . Hypertension   . Hypertensive emergency 11/14/2012  . Hypokalemia 11/14/2012  . Hyponatremia 04/08/2012  . Mixed hyperlipidemia 04/06/2012  . NSTEMI (non-ST elevated myocardial infarction) (HCC) 11/14/2012  . Tobacco abuse 04/06/2012    Procedure(s): LOWER EXTREMITY ANGIOGRAPHY  Discharged Condition: stable  HPI: History of Present Illness: This is a 63 y.o. female who presents to the ED today with rest pain in the left foot.  She states that this has been going on for at least 3 weeks.  CTA with runoff, which revealed a high grade SFA occlusion on the left as well as a 7cm long occlusion of the distal SFA extending into the popliteal artery.    [facility].     Hospital Course:  Rebecca [name] is a 63 y.o. 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  Because of the single-vessel runoff as well as a small caliber of the superficial femoral artery, a left common femoral to below-knee popliteal artery bypass graft with saphenous vein was recommended.  She is scheduled to have  surgery left LE bypass 09/12/2018 with Dr. Arbie CookeyEarly Discharge home today with Norco 5/325 # 30 1 q6 PRN for pain.      Significant Diagnostic Studies: CBC Lab Results  Component Value Date   WBC 6.7 09/06/2018   HGB 10.8 (L) 09/06/2018   HCT 32.5 (L) 09/06/2018   MCV 89.5 09/06/2018   PLT 225 09/06/2018    BMET    Component Value Date/Time   NA 138 09/06/2018 0641   NA 135 08/27/2018 1141   K 3.9 09/06/2018 0641   CL 99 09/06/2018 0641   CO2 28 09/06/2018 0641   GLUCOSE 210 (H) 09/06/2018 0641   BUN 15 09/06/2018 0641   BUN 14 08/27/2018 1141   CREATININE 0.84 09/06/2018 0641   CALCIUM 8.7 (L) 09/06/2018 0641   GFRNONAA >60 09/06/2018 0641   GFRAA >60 09/06/2018 0641   COAG Lab Results  Component Value Date   INR 0.94 11/13/2012     Disposition:  Discharge to :Home Discharge Instructions    Call MD for:  redness, tenderness, or signs of infection (pain, swelling, bleeding, redness, odor or green/yellow discharge around incision site)   Complete by:  As directed    Call MD for:  severe or increased pain, loss or decreased feeling  in affected limb(s)   Complete by:  As directed    Call MD for:  temperature >100.5   Complete by:  As directed  Discharge instructions   Complete by:  As directed    Surgery is planned for 09/12/2018 with Dr. Arbie Cookey   Resume previous diet   Complete by:  As directed      Allergies as of 09/06/2018      Reactions   Penicillins Rash   Did it involve swelling of the face/tongue/throat, SOB, or low BP? No Did it involve sudden or severe rash/hives, skin peeling, or any reaction on the inside of your mouth or nose? Yes Did you need to seek medical attention at a hospital or doctor's office? Yes When did it last happen? Unknown If all above answers are "NO", may proceed with cephalosporin use.      Medication List    TAKE these medications   amLODipine 5 MG tablet Commonly known as:  NORVASC Take 1 tablet (5 mg total) by mouth  daily. What changed:  when to take this   aspirin 81 MG chewable tablet Chew 81 mg by mouth daily.   atorvastatin 80 MG tablet Commonly known as:  LIPITOR Take 1 tablet (80 mg total) by mouth daily at 6 PM.   clotrimazole-betamethasone cream Commonly known as:  Lotrisone Apply 1 application topically 2 (two) times daily.   glyBURIDE 5 MG tablet Commonly known as:  DIABETA Take 2 tablets (10 mg total) by mouth daily with breakfast.   HYDROcodone-acetaminophen 5-325 MG tablet Commonly known as:  Norco Take 1 tablet by mouth every 6 (six) hours as needed for severe pain.   Krill Oil 1000 MG Caps Take 1,000 mg by mouth daily.   lisinopril-hydrochlorothiazide 20-25 MG tablet Commonly known as:  ZESTORETIC Take 1 tablet by mouth daily.   metFORMIN 1000 MG tablet Commonly known as:  GLUCOPHAGE Take 1 tablet (1,000 mg total) by mouth 2 (two) times daily with a meal.   metoprolol tartrate 25 MG tablet Commonly known as:  LOPRESSOR Take 1 tablet (25 mg total) by mouth 2 (two) times daily.   omega-3 acid ethyl esters 1 g capsule Commonly known as:  LOVAZA Take 2 capsules (2 g total) by mouth 2 (two) times daily.      Verbal and written Discharge instructions given to the patient. Wound care per Discharge AVS   Signed: Mosetta Pigeon 09/10/2018, 8:14 AM

## 2018-09-10 NOTE — Progress Notes (Addendum)
PCP - Mike Gip Cardiologist - Olga Millers  Chest x-ray - N/A EKG - 01-08-18  DM - Type 2 Fasting Blood Sugar - 130-160   Aspirin Instructions: Follow surgeon's instructions  Anesthesia review: yes, cardiac history, EKG, high BP Called James at PAT appointment in regards to pt's high BP.  1st reading 176/60, second reading after sitting for about 20 minutes 156/69.  James aware.  Ok with BP, will review chart.  Patient denies shortness of breath, fever, cough and chest pain at PAT appointment   Patient verbalized understanding of instructions that were given to them at the PAT appointment. Patient was also instructed that they will need to review over the PAT instructions again at home before surgery.  Need A1C on DOS - forgot to draw at PAT appointment

## 2018-09-11 ENCOUNTER — Telehealth: Payer: Self-pay | Admitting: Cardiology

## 2018-09-11 ENCOUNTER — Telehealth: Payer: Self-pay | Admitting: *Deleted

## 2018-09-11 LAB — NOVEL CORONAVIRUS, NAA (HOSP ORDER, SEND-OUT TO REF LAB; TAT 18-24 HRS): SARS-CoV-2, NAA: NOT DETECTED

## 2018-09-11 NOTE — Telephone Encounter (Addendum)
   Primary Cardiologist:Brian Jens Som, MD  Chart reviewed as part of pre-operative protocol coverage. Because of Rebecca [name]'s past medical history and time since last visit, he/she will require a follow-up visit in order to better assess preoperative cardiovascular risk.  Please schedule a telehealth visit at first available with Dr. Jens Som.  Video visit preferred.  Addended: Appointment scheduled for 09/12/18.  Pre-op covering staff: - Please schedule appointment and call patient to inform them. - Please contact requesting surgeon's office via preferred method (i.e, phone, fax) to inform them of need for appointment prior to surgery.  If applicable, this message will also be routed to pharmacy pool and/or primary cardiologist for input on holding anticoagulant/antiplatelet agent as requested below so that this information is available at time of patient's appointment.   Rebecca Rutherford [name], PA  09/11/2018, 3:41 PM

## 2018-09-11 NOTE — Telephone Encounter (Signed)
Call to patient and informed of surgery post-poned pending cardiac clearance after Dr. Arbie Cookey spoke with anesthesia. Dr. Ludwig Clarks office called for urgent appt. For clearance. Rebecca [name] faxed all info requested by office. Patient instructed that she would be contacted for cardiology appointment.

## 2018-09-11 NOTE — Telephone Encounter (Signed)
New Message    Pt was suppose to get surgery tomorrow and now they told her they wont do it without a clearance from cardiology. The pt says she really needs this surgery and can hardly walk.     Please call back

## 2018-09-11 NOTE — Telephone Encounter (Signed)
Follow up:  Becky for Vascular returning your call concering this patient. Please call her at [phone]. She states its urgent.

## 2018-09-11 NOTE — Telephone Encounter (Signed)
Request for Surgical Clearance  1. What type of surgery is being performed?  LEFT FEMORAL TO BELOW THE KNEE BYPASS GRAFT- GENERAL ANESTHESIA PLEASE SEE Epic NOTES FROM ANESTHESIA     2. When is this surgery scheduled? PENDING  CLEARANCE  3. What type of clearance is required (medical clearance vs. Pharmacy clearance to hold med vs. Both)? CARDIAC CLEARANCE    4. Are there any medications that need to be held prior to surgery and how long?      5. Practice name and name of physician performing surgery?  [facility]. [facility] [place]     6.  What is your office phone number? [phone]    7. What is your office fax number? (Be sure to include anyone who it needs to go Attn to) [place]    8. Anesthesia type (None, local, MAC, general)? GENERAL   REMINDER TO USER: Remember to please route this message to P CV DIV PREOP in a phone note.

## 2018-09-11 NOTE — Telephone Encounter (Signed)
Called [facility] and left message for them to call us back.

## 2018-09-11 NOTE — Telephone Encounter (Signed)
Called and left message for patient to call back.

## 2018-09-11 NOTE — Progress Notes (Signed)
Anesthesia Chart Review:  Case:  [id number] Date/Time:  09/12/18 0815   Procedure:  BYPASS GRAFT FEMORAL-BELOW KNEE POPLITEAL ARTERY (Left )   Anesthesia type:  General   Pre-op diagnosis:  claudication   [facility]:  [facility] 16 / [facility]   Surgeon:  Larina EarthlyEarly, Todd F, MD      DISCUSSION: 63 yo female former smoker. Pertinent hx includes CAD s/p NSTEMI with PCI to Cx 2014 (60-70% LAD also noted at that time), Uncontrolled DMII (A1c 11.09 Jun 2018), PVD, HTN, HLD.  Follows with cardiology for CAD s/p PCI to circumflex in 2014.  Had catheterization July 2014 that showed ejection fraction 60%.  There was a 99% left circumflex lesion and a 60 to 70% LAD. Cx lesion treated with DES. Procedure was performed by Dr. Jacinto HalimGanji. Per Dr. Verl DickerGanji's office pt has not been by him since 2014.   Last seen by cardiology 01/08/2018 when she was referred to Dr. Jens Somrenshaw by her PCP for eval of uncontrolled HTN. She was restarted on aniHTN medications, started on a statin. Per note, her she was stable from CV standpoint at that time and no testing was ordered.   She continues to have poorly controlled HTN. Some issues with medication noncompliance per notes. BP on arrival to PAT 176/60. Second reading after sitting for about 20 minutes 156/69. Pt in significant pain which may be contributing. She also has poorly controlled DMII with last A1 11.8 on 06/18/18.  She understands markedly uncontrolled BP or BG could be cause for cancellation on DOS.  She understands to take medications as prescribed.  Reviewed case with Drs. Malen GauzeFoster and Aleene DavidsonE. Fitzgerald. Both recommended pt have cardiac clearance prior too surgery given history of CAD with 60-70% stenosis of LAD in 2014. I spoke with Dr. Arbie CookeyEarly. He stated he would postpone the case to allow for cardiac clearance.   VS: BP (!) 156/69   Pulse (!) 54   Temp 36.7 C   Resp 18   Ht 5\' 4"  (1.626 m)   Wt 72.6 kg   SpO2 98%   BMI 27.46 kg/m   PROVIDERS: Mike Gipouglas, Andre, FNP is PCP  Olga Millersrenshaw,  Brian, MD is Cardiologist  LABS: Labs reviewed: Acceptable for surgery. A1c 11.8 06/18/18. Will need DM coordinator consult. (all labs ordered are listed, but only abnormal results are displayed)  Labs Reviewed  GLUCOSE, CAPILLARY - Abnormal; Notable for the following components:      Result Value   Glucose-Capillary 187 (*)    All other components within normal limits  URINALYSIS, ROUTINE W REFLEX MICROSCOPIC - Abnormal; Notable for the following components:   Color, Urine STRAW (*)    Specific Gravity, Urine 1.003 (*)    All other components within normal limits  SURGICAL PCR SCREEN  APTT  PROTIME-INR  TYPE AND SCREEN  ABO/RH      EKG: 01/09/18: Sinus brady. Rate 55. Possible LAE. RBBB.  CV: Cath and PCI 11/14/2012: Impression: Two-vessel coronary artery disease with a subtotally occluded mid circumflex coronary artery. The proximal to midsegment of the LAD shows a heavy plaque burden and 70% stenosis. Preserved left systolic function, EF 60%. Implantation of a 2.5 x 24 mm promos  drug-eluting stent into the proximal and midsegment of the circumflex coronary artery. The stent was deployed at 14 atmospheric pressure for 50  Seconds. Post-angioplasty results were excellent with 0% residual stenoses and TIMI-3 flow was maintained. There was no evidence of edge dissection. The guidewire was withdrawn out of the  body and the guide catheter was engaged and pulled out of the body over the J-wire the was no immediate complication. Patient tolerated the procedure well.  Past Medical History:  Diagnosis Date  . CAD (coronary artery disease)   . Diabetes mellitus without complication (HCC)   . DM (diabetes mellitus), type 2, uncontrolled with complications (HCC) 04/06/2012  . Hypertension   . Hypertensive emergency 11/14/2012  . Hypokalemia 11/14/2012  . Hyponatremia 04/08/2012  . Mixed hyperlipidemia 04/06/2012  . NSTEMI (non-ST elevated myocardial infarction) (HCC) 11/14/2012  . Tobacco  abuse 04/06/2012    Past Surgical History:  Procedure Laterality Date  . LEFT HEART CATHETERIZATION WITH CORONARY ANGIOGRAM N/A 11/14/2012   Procedure: LEFT HEART CATHETERIZATION WITH CORONARY ANGIOGRAM;  Surgeon: Pamella Pert, MD;  Location: [facility] CATH LAB;  Service: Cardiovascular;  Laterality: N/A;  . LOWER EXTREMITY ANGIOGRAPHY N/A 09/05/2018   Procedure: LOWER EXTREMITY ANGIOGRAPHY;  Surgeon: Nada Libman, MD;  [facility] INVAS[facility];  Service: Cardiovascular;  Laterality: N/A;  . PERCUTANEOUS CORONARY STENT INTERVENTION (PCI-S)  11/14/2012   Procedure: PERCUTANEOUS CORONARY STENT INTERVENTION (PCI-S);  Surgeon: Pamella Pert, MD;  Location: [facility] CATH LAB;  Service: Cardiovascular;;  . TONSILLECTOMY      MEDICATIONS: . amLODipine (NORVASC) 5 MG tablet  . aspirin 81 MG chewable tablet  . atorvastatin (LIPITOR) 80 MG tablet  . clotrimazole-betamethasone (LOTRISONE) cream  . glyBURIDE (DIABETA) 5 MG tablet  . HYDROcodone-acetaminophen (NORCO) 5-325 MG tablet  . KRILL OIL 1000 MG CAPS  . lisinopril-hydrochlorothiazide (PRINZIDE,ZESTORETIC) 20-25 MG tablet  . metFORMIN (GLUCOPHAGE) 1000 MG tablet  . metoprolol tartrate (LOPRESSOR) 25 MG tablet  . omega-3 acid ethyl esters (LOVAZA) 1 g capsule   No current facility-administered medications for this encounter.    Zannie Cove [facility] Short Stay Center/Anesthesiology Phone [phone] 09/11/2018 12:51 PM

## 2018-09-11 NOTE — Telephone Encounter (Addendum)
   [facility] Pre-operative Risk Assessment    Request for surgical clearance:  1. What type of surgery is being performed? Left femoral Below knee popliteal artery bypass graft     2. When is this surgery scheduled? ASAP  3. What type of clearance is required (medical clearance vs. Pharmacy clearance to hold med vs. Both)? Medical  4. Are there any medications that need to be held prior to surgery and how long? N/A  5. Practice name and name of physician performing surgery? [facility]   6. What is your office phone number [phone] Becky    7.   What is your office fax number [phone]   8.   Anesthesia type (None, local, MAC, general) ? General    Meryl Crutch 09/11/2018, 3:45 PM  _________________________________________________________________   (provider comments below)

## 2018-09-11 NOTE — Progress Notes (Signed)
Virtual Visit via Video Note   This visit type was conducted due to national recommendations for restrictions regarding the COVID-19 Pandemic (e.g. social distancing) in an effort to limit this patient's exposure and mitigate transmission in our community.  Due to her co-morbid illnesses, this patient is at least at moderate risk for complications without adequate follow up.  This format is felt to be most appropriate for this patient at this time.  All issues noted in this document were discussed and addressed.  A limited physical exam was performed with this format.  Please refer to the patient's chart for her consent to telehealth for [facility].   Date:  09/12/2018   ID:  Rebecca RaringSusan A [name], DOB 07-21-1955, MRN [id number]  Patient Location: Home Provider Location: Home  PCP:  Mike Gipouglas, Andre, FNP  Cardiologist:  Dr Jens Somrenshaw  Evaluation Performed:  FU  Chief Complaint: Preoperative evaluation  History of Present Illness:    FU hypertension and CAD.  Previously followed by Dr. Jacinto HalimGanji.  Had catheterization July 2014 that showed ejection fraction 60%.  There was a 99% left circumflex lesion and a 60 to 70% LAD.  Patient had PCI of circumflex with drug-eluting stent.  Recently seen for left lower extremity ischemia.  Arteriogram Sep 05, 2018 showed greater than 80% left renal artery stenosis, distal left FSA occlusion.  Plan is for left common femoral to below-knee popliteal artery bypass graft.  Cardiology asked to evaluate preoperatively.  Since last seen, patient has had claudication in her left lower extremity.  She states it now hurts at rest and she has developed a "black area" on her second toe.  Of note prior to mid February she did not have claudication and was able to walk 1/2 mile easily with no dyspnea or chest pain.  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  She has not had syncope.  The patient does not have symptoms concerning for  COVID-19 infection (fever, chills, cough, or new shortness of breath).    Past Medical History:  Diagnosis Date   CAD (coronary artery disease)    Diabetes mellitus without complication (HCC)    DM (diabetes mellitus), type 2, uncontrolled with complications (HCC) 04/06/2012   Hypertension    Hypertensive emergency 11/14/2012   Hypokalemia 11/14/2012   Hyponatremia 04/08/2012   Mixed hyperlipidemia 04/06/2012   NSTEMI (non-ST elevated myocardial infarction) (HCC) 11/14/2012   Tobacco abuse 04/06/2012   Past Surgical History:  Procedure Laterality Date   LEFT HEART CATHETERIZATION WITH CORONARY ANGIOGRAM N/A 11/14/2012   Procedure: LEFT HEART CATHETERIZATION WITH CORONARY ANGIOGRAM;  Surgeon: Pamella PertJagadeesh R Ganji, MD;  [facility];  Service: Cardiovascular;  Laterality: N/A;   LOWER EXTREMITY ANGIOGRAPHY N/A 09/05/2018   Procedure: LOWER EXTREMITY ANGIOGRAPHY;  Surgeon: Nada LibmanBrabham, Vance W, MD;  [facility] INVASIVE CV LAB;  Service: Cardiovascular;  Laterality: N/A;   PERCUTANEOUS CORONARY STENT INTERVENTION (PCI-S)  11/14/2012   Procedure: PERCUTANEOUS CORONARY STENT INTERVENTION (PCI-S);  Surgeon: Pamella PertJagadeesh R Ganji, MD;  [facility];  Service: Cardiovascular;;   TONSILLECTOMY       Current Meds  Medication Sig   amLODipine (NORVASC) 5 MG tablet Take 1 tablet (5 mg total) by mouth daily. (Patient taking differently: Take 5 mg by mouth every evening. )   aspirin 81 MG chewable tablet Chew 81 mg by mouth daily.   atorvastatin (LIPITOR) 80 MG tablet Take 1 tablet (80 mg total) by mouth daily at 6  PM.   glyBURIDE (DIABETA) 5 MG tablet Take 2 tablets (10 mg total) by mouth daily with breakfast.   HYDROcodone-acetaminophen (NORCO) 5-325 MG tablet Take 1 tablet by mouth every 6 (six) hours as needed for severe pain.   KRILL OIL 1000 MG CAPS Take 1,000 mg by mouth daily.    lisinopril-hydrochlorothiazide (PRINZIDE,ZESTORETIC) 20-25 MG tablet Take 1 tablet by mouth  daily.   metFORMIN (GLUCOPHAGE) 1000 MG tablet Take 1 tablet (1,000 mg total) by mouth 2 (two) times daily with a meal.   metoprolol tartrate (LOPRESSOR) 25 MG tablet Take 1 tablet (25 mg total) by mouth 2 (two) times daily.     Allergies:   Penicillins   Social History   Tobacco Use   Smoking status: Former Smoker    Packs/day: 1.00    Types: Cigarettes   Smokeless tobacco: Never Used  Substance Use Topics   Alcohol use: Yes    Comment: occ   Drug use: No     Family Hx: The patient's family history includes Arthritis in her brother; Diabetes in her brother and father; Heart disease in her brother and father.  ROS:   Please see the history of present illness.    No Fevers, chills or productive cough.  Patient with left lower extremity claudication. All other systems reviewed and are negative.   Recent Labs: 11/30/2017: TSH 1.190 08/27/2018: ALT 19 09/06/2018: BUN 15; Creatinine, Ser 0.84; Hemoglobin 10.8; Platelets 225; Potassium 3.9; Sodium 138   Recent Lipid Panel Lab Results  Component Value Date/Time   CHOL 271 (H) 06/18/2018 11:36 AM   TRIG 1,431 (HH) 06/18/2018 11:36 AM   HDL 24 (L) 06/18/2018 11:36 AM   CHOLHDL 11.3 (H) 06/18/2018 11:36 AM   CHOLHDL NOT REPORTED DUE TO HIGH TRIGLYCERIDES 11/14/2012 03:35 AM   LDLCALC Comment 06/18/2018 11:36 AM    Wt Readings from Last 3 Encounters:  09/12/18 160 lb (72.6 kg)  09/10/18 160 lb (72.6 kg)  09/05/18 160 lb (72.6 kg)     Objective:    Vital Signs:  BP (!) 176/65    Ht  (1.626 m)    Wt 160 lb (72.6 kg)    BMI 27.46 kg/m    VITAL SIGNS:  reviewed  No acute distress Normal affect Answers questions appropriately Remainder of physical examination not performed (telehealth visit; coronavirus pandemic)  ASSESSMENT & PLAN:    1. Preoperative evaluation prior to peripheral vascular surgery-prior to mid February patient had excellent functional capacity with no dyspnea or chest pain.  She now has had  progressive claudication of left lower extremity with symptoms now at rest.  There also appears to be ischemia of second digit with possible necrotic area by history.  Given that this is a vascular surgery and she does have a history of coronary disease her risk will be somewhat higher.  However given excellent functional capacity prior to claudication I do not think further preoperative evaluation is indicated.  It also appears she has critical limb ischemia and delaying surgery would be harmful.  Patient understands that there will be some increased risk with the procedure but she may proceed from a cardiac standpoint. 2. Coronary artery disease-plan to continue medical therapy with aspirin and statin. 3. Hypertension-patient's blood pressure is elevated; increase amlodipine to 10 mg daily and follow. 4. Hyperlipidemia-continue statin. Lipids from February 17 personally reviewed.  Total cholesterol 271 with triglyceride level 1431.  Lipitor was increased at that time.  I will plan to repeat lipids  and liver when she returns to see me in 3 months.  Will likely need additional medications and referral to lipid clinic at that time. 5. History of tobacco abuse-patient has avoided since previous office visit.  COVID-19 Education: The importance of social distancing was discussed today.  Time:   Today, I have spent 15 minutes with the patient with telehealth technology discussing the above problems.     Medication Adjustments/Labs and Tests Ordered: Current medicines are reviewed at length with the patient today.  Concerns regarding medicines are outlined above.   Tests Ordered: No orders of the defined types were placed in this encounter.   Medication Changes: No orders of the defined types were placed in this encounter.   Disposition:  Follow up in 6 month(s)  Signed, Olga Millers, MD  09/12/2018 8:58 AM    [facility]

## 2018-09-11 NOTE — Telephone Encounter (Signed)
Pt is not on any anticoagulants, PharmD clearance not needed.

## 2018-09-12 ENCOUNTER — Encounter (HOSPITAL_COMMUNITY): Admission: RE | Payer: Self-pay | Source: Home / Self Care

## 2018-09-12 ENCOUNTER — Encounter: Payer: Self-pay | Admitting: Cardiology

## 2018-09-12 ENCOUNTER — Other Ambulatory Visit: Payer: Self-pay

## 2018-09-12 ENCOUNTER — Other Ambulatory Visit: Payer: Self-pay | Admitting: *Deleted

## 2018-09-12 ENCOUNTER — Inpatient Hospital Stay (HOSPITAL_COMMUNITY): Admission: RE | Admit: 2018-09-12 | Payer: Self-pay | Source: Home / Self Care | Admitting: Vascular Surgery

## 2018-09-12 ENCOUNTER — Telehealth (INDEPENDENT_AMBULATORY_CARE_PROVIDER_SITE_OTHER): Payer: Self-pay | Admitting: Cardiology

## 2018-09-12 ENCOUNTER — Telehealth: Payer: Self-pay

## 2018-09-12 ENCOUNTER — Telehealth: Payer: Self-pay | Admitting: Vascular Surgery

## 2018-09-12 ENCOUNTER — Other Ambulatory Visit: Payer: Self-pay | Admitting: Vascular Surgery

## 2018-09-12 VITALS — BP 176/65 | Ht 64.0 in | Wt 160.0 lb

## 2018-09-12 DIAGNOSIS — Z01810 Encounter for preprocedural cardiovascular examination: Secondary | ICD-10-CM

## 2018-09-12 DIAGNOSIS — I10 Essential (primary) hypertension: Secondary | ICD-10-CM

## 2018-09-12 DIAGNOSIS — E7800 Pure hypercholesterolemia, unspecified: Secondary | ICD-10-CM

## 2018-09-12 DIAGNOSIS — I2510 Atherosclerotic heart disease of native coronary artery without angina pectoris: Secondary | ICD-10-CM

## 2018-09-12 SURGERY — BYPASS GRAFT FEMORAL-POPLITEAL ARTERY
Anesthesia: General | Laterality: Left

## 2018-09-12 MED ORDER — AMLODIPINE BESYLATE 10 MG PO TABS: 10.0000 mg | ORAL_TABLET | Freq: Every day | ORAL | 3 refills | Status: DC

## 2018-09-12 MED ORDER — OXYCODONE-ACETAMINOPHEN 5-325 MG PO TABS: 1.0000 | ORAL_TABLET | ORAL | 0 refills | Status: DC | PRN

## 2018-09-12 NOTE — Telephone Encounter (Signed)
Patient had preop appointment with Dr.Crenshaw this morning.

## 2018-09-12 NOTE — Telephone Encounter (Signed)
Patient is now been cleared for surgery from cardiac standpoint.  Called an additional prescription for Percocet No. 30 due to ongoing pain.

## 2018-09-12 NOTE — Progress Notes (Signed)
Call to patient to be at [facility] admitting at 8:15 am on 09/20/2018 for re-scheduled surgery. NPO past MN night prior. Expect a call and follow the detailed instructions received from the hospital pre-admission department. Reviewed visitor and self quarantine instructions for pre-op. Dr. Arbie Cookey agreed to refill pain medication for patient. Patient verbalized understanding.

## 2018-09-12 NOTE — Telephone Encounter (Signed)
Follow up:    Patient returning your call back for a appt this morning. Please call patient back.

## 2018-09-12 NOTE — Telephone Encounter (Signed)
Virtual Visit Pre-Appointment Phone Call  "(Name), I am calling you today to discuss your upcoming appointment. We are currently trying to limit exposure to the virus that causes COVID-19 by seeing patients at home rather than in the office."  1. "What is the BEST phone number to call the day of the visit?" - include this in appointment notes  2. "Do you have or have access to (through a family member/friend) a smartphone with video capability that we can use for your visit?" a. If yes - list this number in appt notes as "cell" (if different from BEST phone #) and list the appointment type as a VIDEO visit in appointment notes b. If no - list the appointment type as a PHONE visit in appointment notes  3. Confirm consent - "In the setting of the current Covid19 crisis, you are scheduled for a (phone or video) visit with your provider on (date) at (time).  Just as we do with many in-office visits, in order for you to participate in this visit, we must obtain consent.  If you'd like, I can send this to your mychart (if signed up) or email for you to review.  Otherwise, I can obtain your verbal consent now.  All virtual visits are billed to your insurance company just like a normal visit would be.  By agreeing to a virtual visit, we'd like you to understand that the technology does not allow for your provider to perform an examination, and thus may limit your provider's ability to fully assess your condition. If your provider identifies any concerns that need to be evaluated in person, we will make arrangements to do so.  Finally, though the technology is pretty good, we cannot assure that it will always work on either your or our end, and in the setting of a video visit, we may have to convert it to a phone-only visit.  In either situation, we cannot ensure that we have a secure connection.  Are you willing to proceed?" STAFF: Did the patient verbally acknowledge consent to telehealth visit? Document  YES/NO here: YES  4. Advise patient to be prepared - "Two hours prior to your appointment, go ahead and check your blood pressure, pulse, oxygen saturation, and your weight (if you have the equipment to check those) and write them all down. When your visit starts, your provider will ask you for this information. If you have an Apple Watch or Kardia device, please plan to have heart rate information ready on the day of your appointment. Please have a pen and paper handy nearby the day of the visit as well."  5. Give patient instructions for MyChart download to smartphone OR Doximity/Doxy.me as below if video visit (depending on what platform provider is using)  6. Inform patient they will receive a phone call 15 minutes prior to their appointment time (may be from unknown caller ID) so they should be prepared to answer    TELEPHONE CALL NOTE  Rebecca [name] has been deemed a candidate for a follow-up tele-health visit to limit community exposure during the Covid-19 pandemic. I spoke with the patient via phone to ensure availability of phone/video source, confirm preferred email & phone number, and discuss instructions and expectations.  I reminded Rebecca [name] to be prepared with any vital sign and/or heart rhythm information that could potentially be obtained via home monitoring, at the time of her visit. I reminded Rebecca [name] to expect a phone call prior to  her visit.  Alphonzo Devera, CMA 09/12/2018 8:51 AM   INSTRUCTIONS FOR DOWNLOADING THE MYCHART APP TO SMARTPHONE  - The patient must first make sure to have activated MyChart and know their login information - If Apple, go to [employer] and type in MyChart in the search bar and download the app. If Android, ask patient to go to [employer] and type in [place] in the search bar and download the app. The app is free but as with any other app downloads, their phone may require them to verify saved payment information or Apple/Android  password.  - The patient will need to then log into the app with their MyChart username and password, and select [facility] as their healthcare provider to link the account. When it is time for your visit, go to the MyChart app, find appointments, and click Begin Video Visit. Be sure to Select Allow for your device to access the Microphone and Camera for your visit. You will then be connected, and your provider will be with you shortly.  **If they have any issues connecting, or need assistance please contact MyChart service desk [phone])**  **If using a computer, in order to ensure the best quality for their visit they will need to use either of the following Internet Browsers: [employer] or Google Chrome**  IF USING DOXIMITY or DOXY.ME - The patient will receive a link just prior to their visit by text.     FULL LENGTH CONSENT FOR TELE-HEALTH VISIT   I hereby voluntarily request, consent and authorize [facility] and its employed or contracted physicians, physician assistants, nurse practitioners or other licensed health care professionals (the Practitioner), to provide me with telemedicine health care services (the "Services") as deemed necessary by the treating Practitioner. I acknowledge and consent to receive the Services by the Practitioner via telemedicine. I understand that the telemedicine visit will involve communicating with the Practitioner through live audiovisual communication technology and the disclosure of certain medical information by electronic transmission. I acknowledge that I have been given the opportunity to request an in-person assessment or other available alternative prior to the telemedicine visit and am voluntarily participating in the telemedicine visit.  I understand that I have the right to withhold or withdraw my consent to the use of telemedicine in the course of my care at any time, without affecting my right to future care or treatment,  and that the Practitioner or I may terminate the telemedicine visit at any time. I understand that I have the right to inspect all information obtained and/or recorded in the course of the telemedicine visit and may receive copies of available information for a reasonable fee.  I understand that some of the potential risks of receiving the Services via telemedicine include:  Marland Kitchen Delay or interruption in medical evaluation due to technological equipment failure or disruption; . Information transmitted may not be sufficient (e.g. poor resolution of images) to allow for appropriate medical decision making by the Practitioner; and/or  . In rare instances, security protocols could fail, causing a breach of personal health information.  Furthermore, I acknowledge that it is my responsibility to provide information about my medical history, conditions and care that is complete and accurate to the best of my ability. I acknowledge that Practitioner's advice, recommendations, and/or decision may be based on factors not within their control, such as incomplete or inaccurate data provided by me or distortions of diagnostic images or specimens that may result from electronic transmissions. I  understand that the practice of medicine is not an exact science and that Practitioner makes no warranties or guarantees regarding treatment outcomes. I acknowledge that I will receive a copy of this consent concurrently upon execution via email to the email address I last provided but may also request a printed copy by calling the office of [facility].    I understand that my insurance will be billed for this visit.   I have read or had this consent read to me. . I understand the contents of this consent, which adequately explains the benefits and risks of the Services being provided via telemedicine.  . I have been provided ample opportunity to ask questions regarding this consent and the Services and have had my questions  answered to my satisfaction. . I give my informed consent for the services to be provided through the use of telemedicine in my medical care  By participating in this telemedicine visit I agree to the above.

## 2018-09-12 NOTE — Patient Instructions (Signed)
Medication Instructions:  INCREASE AMLODIPIINE TO 10 MG ONCE DAILY= 2 OF THE 5 MG TABLETS ONCE DAILY If you need a refill on your cardiac medications before your next appointment, please call your pharmacy.   Lab work: Your physician recommends that you return for lab work PRIOR TO FOLLOW UP APPOINTMENT IN 3 MONTHS AND PRIOR TO EATING If you have labs (blood work) drawn today and your tests are completely normal, you will receive your results only by: Marland Kitchen MyChart Message (if you have MyChart) OR . A paper copy in the mail If you have any lab test that is abnormal or we need to change your treatment, we will call you to review the results.  Follow-Up: At [facility], you and your health needs are our priority.  As part of our continuing mission to provide you with exceptional heart care, we have created designated Provider Care Teams.  These Care Teams include your primary Cardiologist (physician) and Advanced Practice Providers (APPs -  Physician Assistants and Nurse Practitioners) who all work together to provide you with the care you need, when you need it. Your physician recommends that you schedule a follow-up appointment in: 3 MONTHS WITH DR Jens Som

## 2018-09-13 ENCOUNTER — Other Ambulatory Visit: Payer: Self-pay | Admitting: *Deleted

## 2018-09-13 DIAGNOSIS — I739 Peripheral vascular disease, unspecified: Secondary | ICD-10-CM

## 2018-09-13 MED ORDER — HYDROCODONE-ACETAMINOPHEN 5-325 MG PO TABS: 1.0000 | ORAL_TABLET | Freq: Four times a day (QID) | ORAL | 0 refills | Status: DC | PRN

## 2018-09-17 ENCOUNTER — Telehealth: Payer: Self-pay

## 2018-09-17 ENCOUNTER — Encounter: Payer: Self-pay | Admitting: Family Medicine

## 2018-09-17 ENCOUNTER — Ambulatory Visit (INDEPENDENT_AMBULATORY_CARE_PROVIDER_SITE_OTHER): Payer: Self-pay | Admitting: Family Medicine

## 2018-09-17 DIAGNOSIS — I10 Essential (primary) hypertension: Secondary | ICD-10-CM

## 2018-09-17 DIAGNOSIS — IMO0002 Reserved for concepts with insufficient information to code with codable children: Secondary | ICD-10-CM

## 2018-09-17 DIAGNOSIS — I214 Non-ST elevation (NSTEMI) myocardial infarction: Secondary | ICD-10-CM

## 2018-09-17 DIAGNOSIS — E118 Type 2 diabetes mellitus with unspecified complications: Secondary | ICD-10-CM

## 2018-09-17 DIAGNOSIS — I739 Peripheral vascular disease, unspecified: Secondary | ICD-10-CM

## 2018-09-17 DIAGNOSIS — E1165 Type 2 diabetes mellitus with hyperglycemia: Secondary | ICD-10-CM

## 2018-09-17 MED ORDER — LISINOPRIL-HYDROCHLOROTHIAZIDE 20-25 MG PO TABS: 1.0000 | ORAL_TABLET | Freq: Every day | ORAL | 5 refills | Status: DC

## 2018-09-17 NOTE — Telephone Encounter (Signed)
Rebecca [name], Can you prescribe rx for gabapentin. Please advise. Thanks!

## 2018-09-17 NOTE — Progress Notes (Signed)
  [facility]  Virtual Visit via Telephone Note  I connected with Rebecca [name] on 09/17/18 at 10:40 AM EDT by telephone and verified that I am speaking with the correct person using two identifiers.   I discussed the limitations, risks, security and privacy concerns of performing an evaluation and management service by telephone and the availability of in person appointments. I also discussed with the patient that there may be a patient responsible charge related to this service. The patient expressed understanding and agreed to proceed.   History of Present Illness: Rebecca [name]  has a past medical history of CAD (coronary artery disease), Diabetes mellitus without complication (HCC), DM (diabetes mellitus), type 2, uncontrolled with complications (HCC) (04/06/2012), Hypertension, Hypertensive emergency (11/14/2012), Hypokalemia (11/14/2012), Hyponatremia (04/08/2012), Mixed hyperlipidemia (04/06/2012), NSTEMI (non-ST elevated myocardial infarction) (HCC) (11/14/2012), and Tobacco abuse (04/06/2012). Since her last visit with this provider, patient was hospitalized due to PAD. She states that she continues to have pain in her left lower extremity and has bypass surgery scheduled this week. She states that the pain is eased with taking narcotic pain medications, but is not completely relieved. Patient has a history of non-compliance with medications. She reports that she has been taking all medications as prescribed since leaving the hospital. She does not eat a heart healthy diet, exercise or check her fasting blood sugars. She denies chest pain, SOB, dizziness or leg swelling. She does endorse left LE pain that is described as burning.    Observations/Objective: Patient with regular voice tone, rate and rhythm. Speaking calmly and is in no apparent distress.    Assessment and Plan: 1. PAD (peripheral artery disease) (HCC)  2. Hypertension,  unspecified type - lisinopril-hydrochlorothiazide (ZESTORETIC) 20-25 MG tablet; Take 1 tablet by mouth daily.  Dispense: 30 tablet; Refill: 5  3. DM (diabetes mellitus), type 2, uncontrolled with complications (HCC)  4. NSTEMI (non-ST elevated myocardial infarction) (HCC) No medication changes warranted at the present time.  Patient is being followed by a specialist for this condition. Patient advised to continue with follow up appointments. PCP will continue to monitor progress.      Follow Up Instructions:  We discussed hand washing, using hand sanitizer when soap and water are not available, only going out when absolutely necessary, and social distancing. Explained to patient that she is immunocompromised and will need to take precautions during this time.   I discussed the assessment and treatment plan with the patient. The patient was provided an opportunity to ask questions and all were answered. The patient agreed with the plan and demonstrated an understanding of the instructions.   The patient was advised to call back or seek an in-person evaluation if the symptoms worsen or if the condition fails to improve as anticipated.  I provided 10 minutes of non-face-to-face time during this encounter.  Ms. Andr L. Riley Lam, FNP-BC [facility] [address] [place], [place] [address] [phone]

## 2018-09-18 MED ORDER — GABAPENTIN 300 MG PO CAPS: 300.0000 mg | ORAL_CAPSULE | Freq: Three times a day (TID) | ORAL | 0 refills | Status: DC

## 2018-09-18 NOTE — Telephone Encounter (Signed)
Called and left a message that this has been sent into pharmacy, advised to take 1 capsule at bedtime for 1 week, then on the second week to take 1 capsule twice daily, and on the third week to take 1 capsule three times daily. Asked if any quesitons to call back to our office. Thanks!

## 2018-09-19 ENCOUNTER — Other Ambulatory Visit (HOSPITAL_COMMUNITY)
Admission: RE | Admit: 2018-09-19 | Discharge: 2018-09-19 | Disposition: A | Discharge status: Home / Self Care | Payer: HRSA Program | Source: Ambulatory Visit | Attending: Vascular Surgery | Admitting: Vascular Surgery

## 2018-09-19 DIAGNOSIS — Z1159 Encounter for screening for other viral diseases: Secondary | ICD-10-CM | POA: Diagnosis present

## 2018-09-19 LAB — SARS CORONAVIRUS 2 BY RT PCR (HOSPITAL ORDER, PERFORMED IN ~~LOC~~ HOSPITAL LAB): SARS Coronavirus 2: NEGATIVE

## 2018-09-20 ENCOUNTER — Inpatient Hospital Stay (HOSPITAL_COMMUNITY)
Admission: RE | Admit: 2018-09-20 | Discharge: 2018-09-25 | DRG: 253 | Disposition: A | Discharge status: Home / Self Care | Payer: Self-pay | Attending: Vascular Surgery | Admitting: Vascular Surgery

## 2018-09-20 ENCOUNTER — Other Ambulatory Visit: Payer: Self-pay

## 2018-09-20 ENCOUNTER — Telehealth: Payer: Self-pay | Admitting: Vascular Surgery

## 2018-09-20 ENCOUNTER — Inpatient Hospital Stay (HOSPITAL_COMMUNITY): Payer: Self-pay | Admitting: Registered Nurse

## 2018-09-20 ENCOUNTER — Encounter (HOSPITAL_COMMUNITY)
Admission: RE | Disposition: A | Discharge status: Home / Self Care | Payer: Self-pay | Source: Home / Self Care | Attending: Vascular Surgery

## 2018-09-20 ENCOUNTER — Encounter (HOSPITAL_COMMUNITY): Payer: Self-pay | Admitting: Registered Nurse

## 2018-09-20 DIAGNOSIS — I2510 Atherosclerotic heart disease of native coronary artery without angina pectoris: Secondary | ICD-10-CM | POA: Diagnosis present

## 2018-09-20 DIAGNOSIS — Z87891 Personal history of nicotine dependence: Secondary | ICD-10-CM

## 2018-09-20 DIAGNOSIS — Z79899 Other long term (current) drug therapy: Secondary | ICD-10-CM

## 2018-09-20 DIAGNOSIS — Z880 Allergy status to penicillin: Secondary | ICD-10-CM

## 2018-09-20 DIAGNOSIS — Z7984 Long term (current) use of oral hypoglycemic drugs: Secondary | ICD-10-CM

## 2018-09-20 DIAGNOSIS — I998 Other disorder of circulatory system: Secondary | ICD-10-CM | POA: Diagnosis present

## 2018-09-20 DIAGNOSIS — I252 Old myocardial infarction: Secondary | ICD-10-CM

## 2018-09-20 DIAGNOSIS — Z1159 Encounter for screening for other viral diseases: Secondary | ICD-10-CM

## 2018-09-20 DIAGNOSIS — E782 Mixed hyperlipidemia: Secondary | ICD-10-CM | POA: Diagnosis present

## 2018-09-20 DIAGNOSIS — I739 Peripheral vascular disease, unspecified: Secondary | ICD-10-CM | POA: Diagnosis present

## 2018-09-20 DIAGNOSIS — I70212 Atherosclerosis of native arteries of extremities with intermittent claudication, left leg: Secondary | ICD-10-CM

## 2018-09-20 DIAGNOSIS — I10 Essential (primary) hypertension: Secondary | ICD-10-CM | POA: Diagnosis present

## 2018-09-20 DIAGNOSIS — E1152 Type 2 diabetes mellitus with diabetic peripheral angiopathy with gangrene: Principal | ICD-10-CM | POA: Diagnosis present

## 2018-09-20 DIAGNOSIS — Z7982 Long term (current) use of aspirin: Secondary | ICD-10-CM

## 2018-09-20 DIAGNOSIS — L03116 Cellulitis of left lower limb: Secondary | ICD-10-CM | POA: Diagnosis not present

## 2018-09-20 DIAGNOSIS — I701 Atherosclerosis of renal artery: Secondary | ICD-10-CM | POA: Diagnosis present

## 2018-09-20 HISTORY — PX: FEMORAL-POPLITEAL BYPASS GRAFT: SHX937

## 2018-09-20 LAB — GLUCOSE, CAPILLARY
Glucose-Capillary: 148 mg/dL — ABNORMAL HIGH (ref 70–99)
Glucose-Capillary: 158 mg/dL — ABNORMAL HIGH (ref 70–99)
Glucose-Capillary: 175 mg/dL — ABNORMAL HIGH (ref 70–99)
Glucose-Capillary: 202 mg/dL — ABNORMAL HIGH (ref 70–99)

## 2018-09-20 LAB — CBC
HCT: 34.6 % — ABNORMAL LOW (ref 36.0–46.0)
Hemoglobin: 11.7 g/dL — ABNORMAL LOW (ref 12.0–15.0)
MCH: 29.5 pg (ref 26.0–34.0)
MCHC: 33.8 g/dL (ref 30.0–36.0)
MCV: 87.2 fL (ref 80.0–100.0)
Platelets: 241 10*3/uL (ref 150–400)
RBC: 3.97 MIL/uL (ref 3.87–5.11)
RDW: 14.6 % (ref 11.5–15.5)
WBC: 8.7 10*3/uL (ref 4.0–10.5)
nRBC: 0 % (ref 0.0–0.2)

## 2018-09-20 LAB — CREATININE, SERUM
Creatinine, Ser: 0.78 mg/dL (ref 0.44–1.00)
GFR calc Af Amer: >60 mL/min (ref >60)
GFR calc non Af Amer: >60 mL/min (ref >60)

## 2018-09-20 LAB — HEMOGLOBIN A1C
Hgb A1c MFr Bld: 7.8 % — ABNORMAL HIGH (ref 4.8–5.6)
Hgb A1c MFr Bld: 7.8 % — ABNORMAL HIGH (ref 4.8–5.6)
Mean Plasma Glucose: 177.16 mg/dL
Mean Plasma Glucose: 177.16 mg/dL

## 2018-09-20 SURGERY — BYPASS GRAFT FEMORAL-POPLITEAL ARTERY
Anesthesia: General | Site: Leg Upper | Laterality: Left

## 2018-09-20 MED ORDER — 0.9 % SODIUM CHLORIDE (POUR BTL) OPTIME
TOPICAL | Status: DC | PRN
  Administered 2018-09-20: 2000 mL

## 2018-09-20 MED ORDER — FENTANYL CITRATE (PF) 100 MCG/2ML IJ SOLN
50.0000 ug | Freq: Once | INTRAMUSCULAR | Status: AC
  Administered 2018-09-20 (×2): 50 ug via INTRAVENOUS

## 2018-09-20 MED ORDER — SODIUM CHLORIDE 0.9 % IV SOLN
INTRAVENOUS | Status: DC
  Administered 2018-09-20 – 2018-09-21 (×2): via INTRAVENOUS

## 2018-09-20 MED ORDER — FENTANYL CITRATE (PF) 250 MCG/5ML IJ SOLN
INTRAMUSCULAR | Status: DC | PRN
  Administered 2018-09-20 (×8): 50 ug via INTRAVENOUS

## 2018-09-20 MED ORDER — SODIUM CHLORIDE 0.9 % IV SOLN: 500.0000 mL | Freq: Once | INTRAVENOUS | Status: DC | PRN

## 2018-09-20 MED ORDER — LACTATED RINGERS IV SOLN
INTRAVENOUS | Status: DC
  Administered 2018-09-20: 10:00:00 via INTRAVENOUS

## 2018-09-20 MED ORDER — HYDROCHLOROTHIAZIDE 25 MG PO TABS
25.0000 mg | ORAL_TABLET | Freq: Every day | ORAL | Status: DC
  Administered 2018-09-21 – 2018-09-25 (×5): 25 mg via ORAL
  Filled 2018-09-20 (×5): qty 1

## 2018-09-20 MED ORDER — CHLORHEXIDINE GLUCONATE CLOTH 2 % EX PADS: 6.0000 | MEDICATED_PAD | Freq: Once | CUTANEOUS | Status: DC

## 2018-09-20 MED ORDER — SODIUM CHLORIDE 0.9 % IV SOLN: INTRAVENOUS | Status: DC

## 2018-09-20 MED ORDER — PROPOFOL 10 MG/ML IV BOLUS
INTRAVENOUS | Status: DC | PRN
  Administered 2018-09-20: 80 mg via INTRAVENOUS

## 2018-09-20 MED ORDER — DOCUSATE SODIUM 100 MG PO CAPS
100.0000 mg | ORAL_CAPSULE | Freq: Every day | ORAL | Status: DC
  Administered 2018-09-21 – 2018-09-25 (×5): 100 mg via ORAL
  Filled 2018-09-20 (×5): qty 1

## 2018-09-20 MED ORDER — FENTANYL CITRATE (PF) 250 MCG/5ML IJ SOLN
INTRAMUSCULAR | Status: AC
  Filled 2018-09-20: qty 5

## 2018-09-20 MED ORDER — SODIUM CHLORIDE 0.9 % IV SOLN
INTRAVENOUS | Status: DC | PRN
  Administered 2018-09-20: 15 ug/min via INTRAVENOUS

## 2018-09-20 MED ORDER — HEPARIN SODIUM (PORCINE) 1000 UNIT/ML IJ SOLN
INTRAMUSCULAR | Status: DC | PRN
  Administered 2018-09-20: 7000 [IU] via INTRAVENOUS

## 2018-09-20 MED ORDER — HYDROCODONE-ACETAMINOPHEN 5-325 MG PO TABS
1.0000 | ORAL_TABLET | Freq: Four times a day (QID) | ORAL | Status: DC | PRN
  Administered 2018-09-22 – 2018-09-25 (×4): 1 via ORAL
  Filled 2018-09-20 (×4): qty 1

## 2018-09-20 MED ORDER — ASPIRIN 81 MG PO CHEW
81.0000 mg | CHEWABLE_TABLET | Freq: Every day | ORAL | Status: DC
  Administered 2018-09-21 – 2018-09-25 (×5): 81 mg via ORAL
  Filled 2018-09-20 (×5): qty 1

## 2018-09-20 MED ORDER — LACTATED RINGERS IV SOLN
INTRAVENOUS | Status: DC | PRN
  Administered 2018-09-20 (×2): via INTRAVENOUS

## 2018-09-20 MED ORDER — PROTAMINE SULFATE 10 MG/ML IV SOLN
INTRAVENOUS | Status: DC | PRN
  Administered 2018-09-20 (×2): 20 mg via INTRAVENOUS
  Administered 2018-09-20: 10 mg via INTRAVENOUS

## 2018-09-20 MED ORDER — GLYBURIDE 5 MG PO TABS
10.0000 mg | ORAL_TABLET | Freq: Every day | ORAL | Status: DC
  Administered 2018-09-21 – 2018-09-25 (×5): 10 mg via ORAL
  Filled 2018-09-20 (×5): qty 2

## 2018-09-20 MED ORDER — MAGNESIUM SULFATE 2 GM/50ML IV SOLN: 2.0000 g | Freq: Every day | INTRAVENOUS | Status: DC | PRN

## 2018-09-20 MED ORDER — LISINOPRIL 10 MG PO TABS
20.0000 mg | ORAL_TABLET | Freq: Every day | ORAL | Status: DC
  Administered 2018-09-21 – 2018-09-25 (×5): 20 mg via ORAL
  Filled 2018-09-20 (×5): qty 2

## 2018-09-20 MED ORDER — MEPERIDINE HCL 25 MG/ML IJ SOLN: 6.2500 mg | INTRAMUSCULAR | Status: DC | PRN

## 2018-09-20 MED ORDER — FLEET ENEMA 7-19 GM/118ML RE ENEM: 1.0000 | ENEMA | Freq: Once | RECTAL | Status: DC | PRN

## 2018-09-20 MED ORDER — GUAIFENESIN-DM 100-10 MG/5ML PO SYRP: 15.0000 mL | ORAL_SOLUTION | ORAL | Status: DC | PRN

## 2018-09-20 MED ORDER — ROCURONIUM BROMIDE 10 MG/ML (PF) SYRINGE
PREFILLED_SYRINGE | INTRAVENOUS | Status: DC | PRN
  Administered 2018-09-20: 20 mg via INTRAVENOUS
  Administered 2018-09-20: 60 mg via INTRAVENOUS
  Administered 2018-09-20 (×2): 20 mg via INTRAVENOUS

## 2018-09-20 MED ORDER — METOPROLOL TARTRATE 5 MG/5ML IV SOLN: 2.0000 mg | INTRAVENOUS | Status: DC | PRN

## 2018-09-20 MED ORDER — BISACODYL 5 MG PO TBEC
5.0000 mg | DELAYED_RELEASE_TABLET | Freq: Every day | ORAL | Status: DC | PRN
  Administered 2018-09-24 – 2018-09-25 (×2): 5 mg via ORAL
  Filled 2018-09-20 (×2): qty 1

## 2018-09-20 MED ORDER — SODIUM CHLORIDE 0.9 % IV SOLN
INTRAVENOUS | Status: DC | PRN
  Administered 2018-09-20: 12:00:00

## 2018-09-20 MED ORDER — ACETAMINOPHEN 325 MG RE SUPP: 325.0000 mg | RECTAL | Status: DC | PRN

## 2018-09-20 MED ORDER — ACETAMINOPHEN 325 MG PO TABS: 325.0000 mg | ORAL_TABLET | ORAL | Status: DC | PRN

## 2018-09-20 MED ORDER — VANCOMYCIN HCL IN DEXTROSE 1-5 GM/200ML-% IV SOLN
1000.0000 mg | INTRAVENOUS | Status: AC
  Administered 2018-09-20: 1000 mg via INTRAVENOUS

## 2018-09-20 MED ORDER — LISINOPRIL-HYDROCHLOROTHIAZIDE 20-25 MG PO TABS: 1.0000 | ORAL_TABLET | Freq: Every day | ORAL | Status: DC

## 2018-09-20 MED ORDER — PROPOFOL 10 MG/ML IV BOLUS
INTRAVENOUS | Status: AC
  Filled 2018-09-20: qty 20

## 2018-09-20 MED ORDER — VANCOMYCIN HCL IN DEXTROSE 1-5 GM/200ML-% IV SOLN
1000.0000 mg | Freq: Two times a day (BID) | INTRAVENOUS | Status: AC
  Administered 2018-09-20 – 2018-09-21 (×2): 1000 mg via INTRAVENOUS
  Filled 2018-09-20 (×2): qty 200

## 2018-09-20 MED ORDER — MIDAZOLAM HCL 5 MG/5ML IJ SOLN
INTRAMUSCULAR | Status: DC | PRN
  Administered 2018-09-20: 2 mg via INTRAVENOUS

## 2018-09-20 MED ORDER — ROCURONIUM BROMIDE 10 MG/ML (PF) SYRINGE
PREFILLED_SYRINGE | INTRAVENOUS | Status: AC
  Filled 2018-09-20: qty 10

## 2018-09-20 MED ORDER — HEPARIN SODIUM (PORCINE) 5000 UNIT/ML IJ SOLN
5000.0000 [IU] | Freq: Three times a day (TID) | INTRAMUSCULAR | Status: DC
  Administered 2018-09-21 – 2018-09-25 (×13): 5000 [IU] via SUBCUTANEOUS
  Filled 2018-09-20 (×13): qty 1

## 2018-09-20 MED ORDER — ARTIFICIAL TEARS OPHTHALMIC OINT
TOPICAL_OINTMENT | OPHTHALMIC | Status: DC | PRN
  Administered 2018-09-20: 1 via OPHTHALMIC

## 2018-09-20 MED ORDER — FENTANYL CITRATE (PF) 100 MCG/2ML IJ SOLN
INTRAMUSCULAR | Status: AC
  Administered 2018-09-20: 10:00:00 50 ug via INTRAVENOUS
  Filled 2018-09-20: qty 2

## 2018-09-20 MED ORDER — MIDAZOLAM HCL 2 MG/2ML IJ SOLN
INTRAMUSCULAR | Status: AC
  Filled 2018-09-20: qty 2

## 2018-09-20 MED ORDER — MORPHINE SULFATE (PF) 2 MG/ML IV SOLN: 1.0000 mg | INTRAVENOUS | Status: DC | PRN

## 2018-09-20 MED ORDER — GABAPENTIN 300 MG PO CAPS
300.0000 mg | ORAL_CAPSULE | Freq: Three times a day (TID) | ORAL | Status: DC
  Administered 2018-09-20 – 2018-09-25 (×15): 300 mg via ORAL
  Filled 2018-09-20 (×15): qty 1

## 2018-09-20 MED ORDER — METOPROLOL TARTRATE 25 MG PO TABS
25.0000 mg | ORAL_TABLET | Freq: Two times a day (BID) | ORAL | Status: DC
  Administered 2018-09-20 – 2018-09-25 (×10): 25 mg via ORAL
  Filled 2018-09-20 (×10): qty 1

## 2018-09-20 MED ORDER — OXYCODONE-ACETAMINOPHEN 5-325 MG PO TABS
1.0000 | ORAL_TABLET | ORAL | Status: DC | PRN
  Administered 2018-09-20 – 2018-09-25 (×18): 2 via ORAL
  Filled 2018-09-20 (×19): qty 2

## 2018-09-20 MED ORDER — SUGAMMADEX SODIUM 200 MG/2ML IV SOLN
INTRAVENOUS | Status: DC | PRN
  Administered 2018-09-20: 150 mg via INTRAVENOUS

## 2018-09-20 MED ORDER — DEXAMETHASONE SODIUM PHOSPHATE 10 MG/ML IJ SOLN
INTRAMUSCULAR | Status: DC | PRN
  Administered 2018-09-20: 4 mg via INTRAVENOUS

## 2018-09-20 MED ORDER — METFORMIN HCL 500 MG PO TABS
1000.0000 mg | ORAL_TABLET | Freq: Two times a day (BID) | ORAL | Status: DC
  Administered 2018-09-20 – 2018-09-25 (×8): 1000 mg via ORAL
  Filled 2018-09-20 (×8): qty 2

## 2018-09-20 MED ORDER — SODIUM CHLORIDE 0.9 % IV SOLN
INTRAVENOUS | Status: AC
  Filled 2018-09-20: qty 1.2

## 2018-09-20 MED ORDER — LIDOCAINE 2% (20 MG/ML) 5 ML SYRINGE
INTRAMUSCULAR | Status: DC | PRN
  Administered 2018-09-20: 30 mg via INTRAVENOUS

## 2018-09-20 MED ORDER — PROMETHAZINE HCL 25 MG/ML IJ SOLN: 6.2500 mg | INTRAMUSCULAR | Status: DC | PRN

## 2018-09-20 MED ORDER — FENTANYL CITRATE (PF) 100 MCG/2ML IJ SOLN: 25.0000 ug | INTRAMUSCULAR | Status: DC | PRN

## 2018-09-20 MED ORDER — ALUM & MAG HYDROXIDE-SIMETH 200-200-20 MG/5ML PO SUSP: 15.0000 mL | ORAL | Status: DC | PRN

## 2018-09-20 MED ORDER — LABETALOL HCL 5 MG/ML IV SOLN: 10.0000 mg | INTRAVENOUS | Status: DC | PRN

## 2018-09-20 MED ORDER — PANTOPRAZOLE SODIUM 40 MG PO TBEC
40.0000 mg | DELAYED_RELEASE_TABLET | Freq: Every day | ORAL | Status: DC
  Administered 2018-09-21 – 2018-09-25 (×5): 40 mg via ORAL
  Filled 2018-09-20 (×5): qty 1

## 2018-09-20 MED ORDER — EPHEDRINE SULFATE-NACL 50-0.9 MG/10ML-% IV SOSY
PREFILLED_SYRINGE | INTRAVENOUS | Status: DC | PRN
  Administered 2018-09-20: 10 mg via INTRAVENOUS
  Administered 2018-09-20: 5 mg via INTRAVENOUS

## 2018-09-20 MED ORDER — ONDANSETRON HCL 4 MG/2ML IJ SOLN: 4.0000 mg | Freq: Four times a day (QID) | INTRAMUSCULAR | Status: DC | PRN

## 2018-09-20 MED ORDER — INSULIN ASPART 100 UNIT/ML ~~LOC~~ SOLN
0.0000 [IU] | Freq: Three times a day (TID) | SUBCUTANEOUS | Status: DC
  Administered 2018-09-20: 3 [IU] via SUBCUTANEOUS
  Administered 2018-09-22 – 2018-09-23 (×3): 1 [IU] via SUBCUTANEOUS
  Administered 2018-09-24: 2 [IU] via SUBCUTANEOUS

## 2018-09-20 MED ORDER — ONDANSETRON HCL 4 MG/2ML IJ SOLN
INTRAMUSCULAR | Status: DC | PRN
  Administered 2018-09-20: 4 mg via INTRAVENOUS

## 2018-09-20 MED ORDER — VANCOMYCIN HCL IN DEXTROSE 1-5 GM/200ML-% IV SOLN
INTRAVENOUS | Status: AC
  Administered 2018-09-20: 1000 mg via INTRAVENOUS
  Filled 2018-09-20: qty 200

## 2018-09-20 MED ORDER — ATORVASTATIN CALCIUM 80 MG PO TABS
80.0000 mg | ORAL_TABLET | Freq: Every day | ORAL | Status: DC
  Administered 2018-09-20 – 2018-09-24 (×5): 80 mg via ORAL
  Filled 2018-09-20 (×5): qty 1

## 2018-09-20 MED ORDER — SENNOSIDES-DOCUSATE SODIUM 8.6-50 MG PO TABS
1.0000 | ORAL_TABLET | Freq: Every evening | ORAL | Status: DC | PRN
  Administered 2018-09-23 – 2018-09-25 (×2): 1 via ORAL
  Filled 2018-09-20 (×2): qty 1

## 2018-09-20 MED ORDER — AMLODIPINE BESYLATE 10 MG PO TABS
10.0000 mg | ORAL_TABLET | Freq: Every day | ORAL | Status: DC
  Administered 2018-09-21 – 2018-09-25 (×5): 10 mg via ORAL
  Filled 2018-09-20 (×5): qty 1

## 2018-09-20 MED ORDER — PHENOL 1.4 % MT LIQD: 1.0000 | OROMUCOSAL | Status: DC | PRN

## 2018-09-20 MED ORDER — POTASSIUM CHLORIDE CRYS ER 20 MEQ PO TBCR: 20.0000 meq | EXTENDED_RELEASE_TABLET | Freq: Every day | ORAL | Status: DC | PRN

## 2018-09-20 MED ORDER — HYDRALAZINE HCL 20 MG/ML IJ SOLN: 5.0000 mg | INTRAMUSCULAR | Status: DC | PRN

## 2018-09-20 MED ORDER — MIDAZOLAM HCL 2 MG/2ML IJ SOLN: 0.5000 mg | Freq: Once | INTRAMUSCULAR | Status: DC | PRN

## 2018-09-20 SURGICAL SUPPLY — 47 items
BANDAGE ESMARK 6X9 LF (GAUZE/BANDAGES/DRESSINGS) IMPLANT
BNDG ESMARK 6X9 LF (GAUZE/BANDAGES/DRESSINGS)
CANISTER SUCT 3000ML PPV (MISCELLANEOUS) ×3 IMPLANT
CANNULA VESSEL 3MM 2 BLNT TIP (CANNULA) ×6 IMPLANT
CLIP LIGATING EXTRA MED SLVR (CLIP) ×3 IMPLANT
CLIP LIGATING EXTRA SM BLUE (MISCELLANEOUS) ×3 IMPLANT
COVER PROBE W GEL 5X96 (DRAPES) ×3 IMPLANT
COVER WAND RF STERILE (DRAPES) ×3 IMPLANT
CUFF TOURNIQUET SINGLE 34IN LL (TOURNIQUET CUFF) IMPLANT
CUFF TOURNIQUET SINGLE 44IN (TOURNIQUET CUFF) IMPLANT
DERMABOND ADVANCED (GAUZE/BANDAGES/DRESSINGS) ×6
DERMABOND ADVANCED .7 DNX12 (GAUZE/BANDAGES/DRESSINGS) ×3 IMPLANT
DRAIN SNY 10X20 3/4 PERF (WOUND CARE) IMPLANT
DRAPE HALF SHEET 40X57 (DRAPES) IMPLANT
DRAPE X-RAY CASS 24X20 (DRAPES) IMPLANT
ELECT REM PT RETURN 9FT ADLT (ELECTROSURGICAL) ×3
ELECTRODE REM PT RTRN 9FT ADLT (ELECTROSURGICAL) ×1 IMPLANT
EVACUATOR SILICONE 100CC (DRAIN) IMPLANT
GLOVE SS BIOGEL STRL SZ 7.5 (GLOVE) ×1 IMPLANT
GLOVE SUPERSENSE BIOGEL SZ 7.5 (GLOVE) ×2
GOWN STRL REUS W/ TWL LRG LVL3 (GOWN DISPOSABLE) ×3 IMPLANT
GOWN STRL REUS W/TWL LRG LVL3 (GOWN DISPOSABLE) ×6
INSERT FOGARTY SM (MISCELLANEOUS) IMPLANT
KIT BASIN OR (CUSTOM PROCEDURE TRAY) ×3 IMPLANT
KIT TURNOVER KIT B (KITS) ×3 IMPLANT
NS IRRIG 1000ML POUR BTL (IV SOLUTION) ×6 IMPLANT
PACK PERIPHERAL VASCULAR (CUSTOM PROCEDURE TRAY) ×3 IMPLANT
PAD ARMBOARD 7.5X6 YLW CONV (MISCELLANEOUS) ×6 IMPLANT
PADDING CAST COTTON 6X4 STRL (CAST SUPPLIES) ×3 IMPLANT
SET COLLECT BLD 21X3/4 12 (NEEDLE) IMPLANT
STOPCOCK 4 WAY LG BORE MALE ST (IV SETS) IMPLANT
SUT ETHILON 3 0 PS 1 (SUTURE) IMPLANT
SUT PROLENE 5 0 C 1 24 (SUTURE) ×6 IMPLANT
SUT PROLENE 6 0 CC (SUTURE) ×9 IMPLANT
SUT SILK 2 0 SH (SUTURE) ×6 IMPLANT
SUT SILK 3 0 (SUTURE) ×2
SUT SILK 3-0 18XBRD TIE 12 (SUTURE) ×1 IMPLANT
SUT SILK 4 0 (SUTURE) ×2
SUT SILK 4-0 18XBRD TIE 12 (SUTURE) ×1 IMPLANT
SUT VIC AB 2-0 CTX 36 (SUTURE) ×6 IMPLANT
SUT VIC AB 3-0 SH 27 (SUTURE) ×8
SUT VIC AB 3-0 SH 27X BRD (SUTURE) ×4 IMPLANT
TOWEL GREEN STERILE (TOWEL DISPOSABLE) ×3 IMPLANT
TRAY FOLEY MTR SLVR 16FR STAT (SET/KITS/TRAYS/PACK) ×3 IMPLANT
TUBING EXTENTION W/L.L. (IV SETS) IMPLANT
UNDERPAD 30X30 (UNDERPADS AND DIAPERS) ×3 IMPLANT
WATER STERILE IRR 1000ML POUR (IV SOLUTION) ×3 IMPLANT

## 2018-09-20 NOTE — Progress Notes (Signed)
Pt arrived to unit from PACU via bed. Pt oriented to call bell and room. Call bell within reach. Tele applied, CCMD notified. VSS. Will continue to monitor.

## 2018-09-20 NOTE — Anesthesia Procedure Notes (Signed)
Procedure Name: Intubation Date/Time: 09/20/2018 11:04 AM Performed by: Jearld Pies, CRNA Pre-anesthesia Checklist: Patient identified, Emergency Drugs available, Suction available and Patient being monitored Patient Re-evaluated:Patient Re-evaluated prior to induction Oxygen Delivery Method: Circle System Utilized Preoxygenation: Pre-oxygenation with 100% oxygen Induction Type: IV induction Ventilation: Mask ventilation without difficulty Laryngoscope Size: Mac and 3 Grade View: Grade I Tube type: Oral Tube size: 7.0 mm Number of attempts: 1 Airway Equipment and Method: Stylet and Oral airway Placement Confirmation: ETT inserted through vocal cords under direct vision,  positive ETCO2 and breath sounds checked- equal and bilateral Secured at: 21 cm Tube secured with: Tape Dental Injury: Teeth and Oropharynx as per pre-operative assessment

## 2018-09-20 NOTE — Telephone Encounter (Signed)
sch appt lvm mld ltr 10/09/2018 340pm p/o MD

## 2018-09-20 NOTE — Progress Notes (Signed)
Inpatient Diabetes Program Recommendations  AACE/ADA: New Consensus Statement on Inpatient Glycemic Control (2015)  Target Ranges:  Prepandial:   less than 140 mg/dL      Peak postprandial:   less than 180 mg/dL (1-2 hours)      Critically ill patients:  140 - 180 mg/dL   Lab Results  Component Value Date   GLUCAP 148 (H) 09/20/2018   HGBA1C 11.8 (H) 06/18/2018    Review of Glycemic Control  Diabetes history: DM 2 Outpatient Diabetes medications: Glyburide 10 mg Daily, Metformin 1000 mg BID Current orders for Inpatient glycemic control: In OR area.  Consult for uncontrolled DM 2 with last A1c 11.2% in February this year.  Inpatient Diabetes Program Recommendations:    Patient's A1c was 3 months ago at a PCP office visit, no changes to medications were made. Per PCP notes patient is non compliant with diet and exercise.  Consider obtaining another A1c level since last one was 3 months ago.  Will attempt to contact patient regarding glucose control. In perioperative area currently.  Thanks,  Christena Deem RN, MSN, BC-ADM Inpatient Diabetes Coordinator Team Pager [phone] (8a-5p)

## 2018-09-20 NOTE — Anesthesia Postprocedure Evaluation (Signed)
Anesthesia Post Note  Patient: ZARIAHA GOENNER  Procedure(s) Performed: Left  FEMORAL-BELOW KNEE POPLITEAL ARTERY Bypass with Translocated nonreversed saphenous vein (Left Leg Upper)     Patient location during evaluation: PACU Anesthesia Type: General Level of consciousness: awake and alert, patient cooperative and oriented Pain management: pain level controlled Vital Signs Assessment: post-procedure vital signs reviewed and stable Respiratory status: spontaneous breathing, nonlabored ventilation, respiratory function stable and patient connected to nasal cannula oxygen Cardiovascular status: blood pressure returned to baseline and stable Postop Assessment: no apparent nausea or vomiting Anesthetic complications: no    Last Vitals:  Vitals:   09/20/18 1500 09/20/18 1520  BP: (!) 147/67 (!) 159/59  Pulse: 68 66  Resp: 16 16  Temp: 36.5 C 36.7 C  SpO2: 99% 97%    Last Pain:  Vitals:   09/20/18 1520  TempSrc: Oral  PainSc:                  Lucyann Romano,E. Kortne All

## 2018-09-20 NOTE — Telephone Encounter (Signed)
-----   Message from Lars Mage, [place] sent at 09/20/2018  2:09 PM EDT -----  S/p left fem-pop f/u with Dr. Arbie Cookey in 2-3 weeks

## 2018-09-20 NOTE — Progress Notes (Signed)
PT Cancellation Note  Patient Details Name: Rebecca [name] MRN: [id number] DOB: 1955-07-06   Cancelled Treatment:    Reason Eval/Treat Not Completed: Other (comment). Per RN, pt just arrived to floor. Will follow-up for PT evaluation tomorrow.  Ina Homes, PT, DPT Acute Rehabilitation Services  Pager [phone] Office [phone]  Malachy Chamber 09/20/2018, 3:50 PM

## 2018-09-20 NOTE — Plan of Care (Addendum)
Care Plan has been reviewed:  Problem: Education: Goal: Knowledge of General Education information will improve Description Including pain rating scale, medication(s)/side effects and non-pharmacologic comfort measures Outcome: Progressing   Problem: Clinical Measurements: on room air. Goal: Respiratory complications will improve Outcome: Progressing:  SPO2 98% on room air until 01:00 am,her SPO2 was dropped to  83 %,Pt was sleeping. 2 LPM of O2 NCL given, SPO2 remained > 96%. Continue to monitor.   Problem: Clinical Measurements: On cardiac monitor Goal: Cardiovascular complication will be avoided Outcome: Progressing: NSR, HR 65-70s, hemodynamic remains stable   Problem: Elimination: has foley catheter. Will remove at am. Goal: Will not experience complications related to bowel motility Outcome: Progressing: good amount of urine out put.   Problem: Pain Managment: Goal: General experience of comfort will improve Outcome: Progressing: Pt is sleeping well at night, adequate pain management.   Problem: Clinical Measurements: blood glucose ACHS Goal: Diagnostic test results will improve Outcome: Progressing  Problem: Skin Integrity: surgical wound Goal: Risk for impaired skin integrity will decrease Outcome: Progressing: wound dry, clean and intact, liquid skin adhesive, no drainage.   Filiberto Pinks, BSN, RN, PCCN- [facility], CSC

## 2018-09-20 NOTE — Interval H&P Note (Signed)
History and Physical Interval Note:  09/20/2018 10:28 AM  Rebecca [name]  has presented today for surgery, with the diagnosis of claudication.  The various methods of treatment have been discussed with the patient and family. After consideration of risks, benefits and other options for treatment, the patient has consented to  Procedure(s): BYPASS GRAFT FEMORAL-BELOW KNEE POPLITEAL ARTERY (Left) as a surgical intervention.  The patient's history has been reviewed, patient examined, no change in status, stable for surgery.  I have reviewed the patient's chart and labs.  Questions were answered to the patient's satisfaction.     Gretta Began

## 2018-09-20 NOTE — Op Note (Signed)
° ° °  OPERATIVE REPORT  DATE OF SURGERY: 09/20/2018  PATIENT: Rebecca [name], 63 y.o. female MRN: [id number]  DOB: Nov 08, 1955  PRE-OPERATIVE DIAGNOSIS: Critical limb ischemia with rest pain and tissue loss on the left second toe  POST-OPERATIVE DIAGNOSIS:  Same  PROCEDURE: Left femoral to below-knee popliteal bypass with translocated non-reversed great saphenous vein  SURGEON:  Gretta Began, M.D.  PHYSICIAN ASSISTANT: Clinton Gallant, PA-C  ANESTHESIA: General  EBL: per anesthesia record  Total I/O In: 1000 [I.V.:1000] Out: 1000 [Urine:950; Blood:50]  BLOOD ADMINISTERED: none  DRAINS: none  SPECIMEN: none  COUNTS CORRECT:  YES  PATIENT DISPOSITION:  PACU - hemodynamically stable  PROCEDURE DETAILS: The patient was taken the operating placed supine position where the area of the left groin left leg prepped draped in usual sterile fashion.  Incision was made over the femoral pulse and carried down to isolate the common superficial femoral and profundus femoris arteries.  Patient had an excellent pulse in the left groin and a normal calibers common femoral artery.  The saphenous vein was identified at the saphenofemoral junction.  The vein was of excellent caliber.  Tributary branches were ligated with 3-0 and 4-0 silk ties and divided.  The vein was harvested using multiple skin bridges through the thigh.  The below-knee popliteal artery was exposed through the medial approach through the vein harvest incision.  The popliteal artery was of good caliber with minimal atherosclerotic change.  The saphenous vein was ligated distally in the proximal calf and was divided.  The saphenofemoral junction was occluded with a Cooley clamp and the saphenous vein was excised from the saphenofemoral junction.  The saphenofemoral junction was oversewn with a 5-0 Prolene suture.  The vein was gently dilated and was of excellent caliber.  The vein was marked to reduce risk of twisting.  A tunnel was  created from the level of the below-knee popliteal artery to the groin.  The patient was given 7000 units of intravenous heparin.  After adequate circulation time the common femoral artery was occluded proximally distally and was opened with an 11 blade and sent mostly with Potts scissors.  The vein was brought into the field and was spatulated and the proximal portion of the vein was sewn end-to-side to the common femoral artery with a running 6-0 Prolene suture.  Clamps removed and excellent anastomosis was encountered.  The [place] [employer] valvulotome was brought onto the field and were used to divide the valves giving good flow through the vein graft.  The vein was brought through the prior created tunnel down to the level of the below-knee popliteal artery.  The popliteal artery was occluded proximally distally and was opened with an 11 blade and sent longitudinally with Potts scissors.  The vein was cut to the appropriate length and was spatulated and sewn end-to-side to the popliteal artery with a running 6-0 Prolene suture.  Prior to completion of the closure the usual flushing maneuvers were undertaken.  The anastomosis was then completed and flow was restored.  The patient had excellent Doppler flow at the peroneal artery which was her sole runoff with graft dependency of this.  The patient was given 50 mg of protamine reverse the heparin.  Wounds irrigated with saline.  Hemostasis electrocautery.  Wounds were closed with 3-0 Vicryl in the subcutaneous septic tissue.  Sterile dressing was applied and the patient was transferred to the recovery room in stable condition   Larina Earthly, M.D., [facility] 09/20/2018 2:28 PM

## 2018-09-20 NOTE — Anesthesia Preprocedure Evaluation (Addendum)
Anesthesia Evaluation  Patient identified by MRN, date of birth, ID band Patient awake    Reviewed: Allergy & Precautions, NPO status , Patient's Chart, lab work & pertinent test results, reviewed documented beta blocker date and time   History of Anesthesia Complications Negative for: history of anesthetic complications  Airway Mallampati: II  TM Distance: >3 FB Neck ROM: Full    Dental  (+) Poor Dentition, Missing, Dental Advisory Given   Pulmonary former smoker (quit 2019),  SARS coronavirus NEG   breath sounds clear to auscultation       Cardiovascular hypertension, Pt. on medications and Pt. on home beta blockers (-) angina+ CAD, + Past MI, + Cardiac Stents and + Peripheral Vascular Disease   Rhythm:Regular Rate:Normal  '14 cath: EF 60%, stent in Cx, 70% LAD   Neuro/Psych negative neurological ROS     GI/Hepatic negative GI ROS, Neg liver ROS,   Endo/Other  diabetes (glu 158), Oral Hypoglycemic Agents  Renal/GU negative Renal ROS     Musculoskeletal   Abdominal   Peds  Hematology negative hematology ROS (+)   Anesthesia Other Findings   Reproductive/Obstetrics                            Anesthesia Physical Anesthesia Plan  ASA: III  Anesthesia Plan: General   Post-op Pain Management:    Induction: Intravenous  PONV Risk Score and Plan: 3 and Ondansetron, Dexamethasone and Treatment may vary due to age or medical condition  Airway Management Planned: Oral ETT  Additional Equipment:   Intra-op Plan:   Post-operative Plan: Extubation in OR  Informed Consent: I have reviewed the patients History and Physical, chart, labs and discussed the procedure including the risks, benefits and alternatives for the proposed anesthesia with the patient or authorized representative who has indicated his/her understanding and acceptance.     Dental advisory given  Plan Discussed with:  CRNA and Surgeon  Anesthesia Plan Comments:        Anesthesia Quick Evaluation

## 2018-09-20 NOTE — Transfer of Care (Signed)
Immediate Anesthesia Transfer of Care Note  Patient: Rebecca [name]  Procedure(s) Performed: Left  FEMORAL-BELOW KNEE POPLITEAL ARTERY Bypass with Translocated nonreversed saphenous vein (Left Leg Upper)  Patient Location: PACU  Anesthesia Type:General  Level of Consciousness: drowsy, patient cooperative and responds to stimulation  Airway & Oxygen Therapy: Patient Spontanous Breathing and Patient connected to face mask oxygen  Post-op Assessment: Report given to RN and Post -op Vital signs reviewed and stable  Post vital signs: Reviewed and stable  Last Vitals:  Vitals Value Taken Time  BP 158/59 09/20/2018  2:31 PM  Temp    Pulse 71 09/20/2018  2:34 PM  Resp 14 09/20/2018  2:34 PM  SpO2 90 % 09/20/2018  2:34 PM  Vitals shown include unvalidated device data.  Last Pain:  Vitals:   09/20/18 1012  TempSrc:   PainSc: 10-Worst pain ever         Complications: No apparent anesthesia complications

## 2018-09-21 ENCOUNTER — Encounter (HOSPITAL_COMMUNITY): Payer: Self-pay | Admitting: Vascular Surgery

## 2018-09-21 ENCOUNTER — Inpatient Hospital Stay (HOSPITAL_COMMUNITY): Payer: Self-pay

## 2018-09-21 DIAGNOSIS — I96 Gangrene, not elsewhere classified: Secondary | ICD-10-CM

## 2018-09-21 DIAGNOSIS — I739 Peripheral vascular disease, unspecified: Secondary | ICD-10-CM

## 2018-09-21 LAB — GLUCOSE, CAPILLARY
Glucose-Capillary: 103 mg/dL — ABNORMAL HIGH (ref 70–99)
Glucose-Capillary: 119 mg/dL — ABNORMAL HIGH (ref 70–99)
Glucose-Capillary: 84 mg/dL (ref 70–99)
Glucose-Capillary: 137 mg/dL — ABNORMAL HIGH (ref 70–99)

## 2018-09-21 LAB — BASIC METABOLIC PANEL
Anion gap: 14 (ref 5–15)
BUN: 11 mg/dL (ref 8–23)
CO2: 24 mmol/L (ref 22–32)
Calcium: 8.7 mg/dL — ABNORMAL LOW (ref 8.9–10.3)
Chloride: 96 mmol/L — ABNORMAL LOW (ref 98–111)
Creatinine, Ser: 0.86 mg/dL (ref 0.44–1.00)
GFR calc Af Amer: >60 mL/min (ref >60)
GFR calc non Af Amer: >60 mL/min (ref >60)
Glucose, Bld: 150 mg/dL — ABNORMAL HIGH (ref 70–99)
Potassium: 4 mmol/L (ref 3.5–5.1)
Sodium: 134 mmol/L — ABNORMAL LOW (ref 135–145)

## 2018-09-21 LAB — CBC
HCT: 29.3 % — ABNORMAL LOW (ref 36.0–46.0)
Hemoglobin: 10 g/dL — ABNORMAL LOW (ref 12.0–15.0)
MCH: 29.7 pg (ref 26.0–34.0)
MCHC: 34.1 g/dL (ref 30.0–36.0)
MCV: 86.9 fL (ref 80.0–100.0)
Platelets: 248 10*3/uL (ref 150–400)
RBC: 3.37 MIL/uL — ABNORMAL LOW (ref 3.87–5.11)
RDW: 14.6 % (ref 11.5–15.5)
WBC: 9.9 10*3/uL (ref 4.0–10.5)
nRBC: 0 % (ref 0.0–0.2)

## 2018-09-21 MED ORDER — OXYCODONE-ACETAMINOPHEN 5-325 MG PO TABS: 1.0000 | ORAL_TABLET | Freq: Four times a day (QID) | ORAL | 0 refills | Status: DC | PRN

## 2018-09-21 NOTE — Progress Notes (Signed)
Inpatient Diabetes Program Recommendations  AACE/ADA: New Consensus Statement on Inpatient Glycemic Control (2015)  Target Ranges:  Prepandial:   less than 140 mg/dL      Peak postprandial:   less than 180 mg/dL (1-2 hours)      Critically ill patients:  140 - 180 mg/dL   Results for Rebecca [name], Rebecca [name] (MRN [id number]) as of 09/21/2018 14:33  Ref. Range 06/18/2018 11:36 09/20/2018 10:22  Hemoglobin A1C Latest Ref Range: 4.8 - 5.6 % 11.8 (H) 7.8 (H)    Review of Glycemic Control  Diabetes history: DM 2 Outpatient Diabetes medications: Glyburide 10 mg Daily, Metformin 1000 mg BID   Spoke with patient at bedside. Discussed her current A1c 7.8%. Patient reported back in February with her gout flare ups she decided to change everything about how she should eat and feels she has been doing well since. She also mentioned her doctor said if she did not get her numbers down that she would be placed on insulin and she did not want that. Patient reports giving up sodas.  Encouraged her to continue to improve her diet and told her to keep up the good work.  Thanks,  Christena Deem RN, MSN, BC-ADM Inpatient Diabetes Coordinator Team Pager [phone] (8a-5p)

## 2018-09-21 NOTE — Progress Notes (Addendum)
  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 Neurologic: A&O  CBC    Component Value Date/Time   WBC 9.9 09/20/2018 2358   RBC 3.37 (L) 09/20/2018 2358   HGB 10.0 (L) 09/20/2018 2358   HGB 11.9 11/30/2017 1351   HCT 29.3 (L) 09/20/2018 2358   HCT 36.7 11/30/2017 1351   PLT 248 09/20/2018 2358   PLT 190 11/30/2017 1351   MCV 86.9 09/20/2018 2358   MCV 92 11/30/2017 1351   MCH 29.7 09/20/2018 2358   MCHC 34.1 09/20/2018 2358   RDW 14.6 09/20/2018 2358   RDW 16.5 (H) 11/30/2017 1351   LYMPHSABS 1.8 08/31/2018 1411   LYMPHSABS 1.4 11/30/2017 1351   MONOABS 0.4 08/31/2018 1411   EOSABS 0.3 08/31/2018 1411   EOSABS 0.1 11/30/2017 1351   BASOSABS 0.1 08/31/2018 1411   BASOSABS 0.0 11/30/2017 1351    BMET    Component Value Date/Time   NA 134 (L) 09/20/2018 2358   NA 135 08/27/2018 1141   K 4.0 09/20/2018 2358   CL 96 (L) 09/20/2018 2358   CO2 24 09/20/2018 2358   GLUCOSE 150 (H) 09/20/2018 2358   BUN 11 09/20/2018 2358   BUN 14 08/27/2018 1141   CREATININE 0.86 09/20/2018 2358   CALCIUM 8.7 (L) 09/20/2018 2358   GFRNONAA >60 09/20/2018 2358   GFRAA >60 09/20/2018 2358    INR    Component Value Date/Time   INR 1.1 09/10/2018 1047     Intake/Output Summary (Last 24 hours) at 09/21/2018 0752 Last data filed at 09/21/2018 0630 Gross per 24 hour  Intake 3192.86 ml  Output 3500 ml  Net -307.14 ml     Assessment/Plan:  63 y.o. female is s/p L femoral to BK popliteal bypass with vein 1 Day Post-Op   -Well perfused L foot with brisk PT and DP signal by  doppler -No surgical intervention indicated for L 2nd toe at this time -OOB, ambulate today -D/c tomorrow if moving around well today and pain controlled   Emilie Rutter, PA-C [facility] [phone] 09/21/2018 7:52 AM   I have examined the patient, reviewed and agree with above.  Surgical incisions all look good.  Reports marked improvement in her rest pain left foot.  He does have single-vessel peroneal runoff with excellent flow into her foot by Doppler.  Plan discharge in a.m.  Gretta Began, MD 09/21/2018 1:19 PM

## 2018-09-21 NOTE — Progress Notes (Signed)
Bilateral ABI completed. Results in Chart review CV proc. [employer], RVS 09/21/2018, 10:39 AM

## 2018-09-21 NOTE — Evaluation (Signed)
Physical Therapy Evaluation Patient Details Name: Rebecca [name] MRN: [id number] DOB: 22-Nov-1955 Today's Date: 09/21/2018   History of Present Illness  Pt is a 63 y/o female s/p Left femoral to below-knee popliteal bypass with translocated non-reversed great saphenous vein. She has a PMH including CAD, DM HTN, Hypokalemia, Hyponatremia, NSTEMI (11/14/2012), and Tobacco abuse.  Clinical Impression  Patient had pain with ambulation but was able to ambulate with min guarding. She ambulated 8'. She reported the pain is different then the pain she was having prior to surgery. Prior to surgery she was using a wheelchair to get around. Her mobility now is an improvement. She reports she will have help at home when she goes home. She may benefit from home health therapy, Acute therapy will continue to work with the patient. She does not have a walker at home. She does not feel like she needs a bed side commode.     Follow Up Recommendations Home health PT    Equipment Recommendations  Rolling walker with 5" wheels(declines BSC )    Recommendations for Other Services       Precautions / Restrictions Precautions Precautions: None Restrictions Weight Bearing Restrictions: Yes LLE Weight Bearing: Weight bearing as tolerated      Mobility  Bed Mobility Overal bed mobility: Needs Assistance Bed Mobility: Supine to Sit     Supine to sit: Supervision     General bed mobility comments: supervision for intial balance when sitting. Patient able to scoot herself to the edge of the bed and balance improved.   Transfers Overall transfer level: Needs assistance Equipment used: Rolling walker (2 wheeled) Transfers: Sit to/from Stand Sit to Stand: Min guard         General transfer comment: Min gaurd and cuing for hand palcement on the walker. Pain upon initial standing that decreased slightly after a few seconds   Ambulation/Gait Ambulation/Gait assistance: Min guard Gait Distance (Feet):  8 Feet Assistive device: Rolling walker (2 wheeled) Gait Pattern/deviations: Decreased stance time - left;Decreased step length - right Gait velocity: decreased   General Gait Details: Increased pain with weightbearing. Gait pattern improved with distance but patient reported increased pain in her toes.   Stairs            Wheelchair Mobility    Modified Rankin (Stroke Patients Only)       Balance Overall balance assessment: Needs assistance Sitting-balance support: Feet supported;Single extremity supported Sitting balance-Leahy Scale: Fair     Standing balance support: Bilateral upper extremity supported Standing balance-Leahy Scale: Poor                               Pertinent Vitals/Pain Pain Assessment: 0-10 Pain Score: 5 (increased close to 10 with weight bearing ) Pain Location: left LE and toes  Pain Descriptors / Indicators: Aching;Burning;Grimacing;Guarding Pain Intervention(s): Limited activity within patient's tolerance    Home Living Family/patient expects to be discharged to:: Private residence Living Arrangements: Alone Available Help at Discharge: Family Type of Home: House Home Access: Level entry     Home Layout: One level   Additional Comments: Patient reqorts her bathroom is not very big. She has been using a wheelchair becuase of pain but can not fit it in the bathroom    Prior Function Level of Independence: Independent         Comments: Patient was indepcnent but over the past 2 weeks she has been using a wheelchair  because of pain. When she walks more then 20' her pain level rises to 10/10. 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      Hand Dominance        Extremity/Trunk Assessment   Upper Extremity Assessment Upper Extremity Assessment: Defer to OT evaluation    Lower Extremity Assessment Lower Extremity Assessment: LLE deficits/detail LLE: Unable to fully assess due to  pain LLE Sensation: WNL LLE Coordination: WNL    Cervical / Trunk Assessment Cervical / Trunk Assessment: Normal  Communication   Communication: No difficulties  Cognition Arousal/Alertness: Awake/alert Behavior During Therapy: WFL for tasks assessed/performed Overall Cognitive Status: Within Functional Limits for tasks assessed                                        General Comments General comments (skin integrity, edema, etc.): surgical incision up the medial thigh and medial ankle     Exercises     Assessment/Plan    PT Assessment Patient needs continued PT services  PT Problem List Decreased strength;Decreased activity tolerance;Decreased range of motion;Pain;Decreased mobility;Decreased balance;Decreased knowledge of use of DME       PT Treatment Interventions Gait training;Stair training;DME instruction;Functional mobility training;Therapeutic activities;Therapeutic exercise;Neuromuscular re-education;Balance training    PT Goals (Current goals can be found in the Care Plan section)  Acute Rehab PT Goals Patient Stated Goal: to get back to work  PT Goal Formulation: With patient Time For Goal Achievement: 09/28/18 Potential to Achieve Goals: Good    Frequency Min 3X/week   Barriers to discharge        Co-evaluation               AM-PAC PT "6 Clicks" Mobility  Outcome Measure Help needed turning from your back to your side while in a flat bed without using bedrails?: None Help needed moving from lying on your back to sitting on the side of a flat bed without using bedrails?: None Help needed moving to and from a bed to a chair (including a wheelchair)?: A Little Help needed standing up from a chair using your arms (e.g., wheelchair or bedside chair)?: A Little Help needed to walk in hospital room?: A Little Help needed climbing 3-5 steps with a railing? : A Lot 6 Click Score: 19    End of Session Equipment Utilized During Treatment:  Gait belt Activity Tolerance: Patient limited by pain Patient left: in chair;with call bell/phone within reach;with nursing/sitter in room Nurse Communication: Mobility status PT Visit Diagnosis: Unsteadiness on feet (R26.81);Other abnormalities of gait and mobility (R26.89);Pain Pain - Right/Left: Left Pain - part of body: Leg    Time: 1130-1156 PT Time Calculation (min) (ACUTE ONLY): 26 min   Charges:   PT Evaluation $PT Eval Moderate Complexity: 1 Mod            Dessie Coma PT DPT  09/21/2018, 1:32 PM

## 2018-09-21 NOTE — Evaluation (Signed)
Occupational Therapy Evaluation Patient Details Name: Rebecca [name] MRN: [id number] DOB: February 21, 1956 Today's Date: 09/21/2018    History of Present Illness Pt is a 63 y/o female s/p Left femoral to below-knee popliteal bypass with translocated non-reversed great saphenous vein. She has a PMH including CAD, DM HTN, Hypokalemia, Hyponatremia, NSTEMI (11/14/2012), and Tobacco abuse.   Clinical Impression   PTA Pt has been sponge bathing for the past 2 weeks and using WC, but prior to that was independent in ADL and mobility. Pt today is min guard with RW for short in room mobility for toilet transfer, mod A for clothing management LB and set up for all grooming/eating in seated position. Pain increased from a 5 to a 10 with WB. Educated Pt on uses of 3 in 1 especially as a shower chair and Pt sees the value and safety in it. [facility] acute setting [facility] If she does not progress she will need SNF level care as she lives alone.     Follow Up Recommendations  Home health OT;Supervision - Intermittent    Equipment Recommendations  3 in 1 bedside commode(Pt WILL accept a 3 in 1 to use as shower chair)    Recommendations for Other Services       Precautions / Restrictions Precautions Precautions: None Restrictions Weight Bearing Restrictions: Yes LLE Weight Bearing: Weight bearing as tolerated      Mobility Bed Mobility Overal bed mobility: Needs Assistance Bed Mobility: Supine to Sit     Supine to sit: Supervision     General bed mobility comments: supervision for intial balance when sitting. Patient able to scoot herself to the edge of the bed and balance improved.   Transfers Overall transfer level: Needs assistance Equipment used: Rolling walker (2 wheeled) Transfers: Sit to/from Stand Sit to Stand: Min guard         General transfer comment: Min gaurd and cuing for hand palcement on the walker. Pain upon initial  standing that decreased slightly after a few seconds     Balance Overall balance assessment: Needs assistance Sitting-balance support: Feet supported;Single extremity supported Sitting balance-Leahy Scale: Fair     Standing balance support: Bilateral upper extremity supported Standing balance-Leahy Scale: Poor                             ADL either performed or assessed with clinical judgement   ADL Overall ADL's : Needs assistance/impaired Eating/Feeding: Modified independent;Sitting   Grooming: Set up;Sitting   Upper Body Bathing: Set up;Sitting   Lower Body Bathing: Moderate assistance;Sit to/from stand   Upper Body Dressing : Set up;Sitting   Lower Body Dressing: Moderate assistance;Sit to/from stand Lower Body Dressing Details (indicate cue type and reason): to manage clothing, no assist for power up Toilet Transfer: Min Probation officerguard;Stand-pivot;BSC;RW Toilet Transfer Details (indicate cue type and reason): SPT to BSC, min guard for safety and vc for safe hand placement Toileting- Clothing Manipulation and Hygiene: Min guard;Sitting/lateral lean Toileting - Clothing Manipulation Details (indicate cue type and reason): peri care in sitting     Functional mobility during ADLs: Min guard;Rolling walker(very short ambulation)       Vision         Perception     Praxis      Pertinent Vitals/Pain Pain Assessment: 0-10 Pain Score: 5 (10 with WB) Pain Location: left LE and toes  Pain Descriptors /  Indicators: Aching;Burning;Grimacing;Guarding Pain Intervention(s): Limited activity within patient's tolerance;Monitored during session;Repositioned     Hand Dominance     Extremity/Trunk Assessment Upper Extremity Assessment Upper Extremity Assessment: Overall WFL for tasks assessed   Lower Extremity Assessment Lower Extremity Assessment: Defer to PT evaluation LLE: Unable to fully assess due to pain LLE Sensation: WNL LLE Coordination: WNL   Cervical /  Trunk Assessment Cervical / Trunk Assessment: Normal   Communication Communication Communication: No difficulties   Cognition Arousal/Alertness: Awake/alert Behavior During Therapy: WFL for tasks assessed/performed Overall Cognitive Status: Within Functional Limits for tasks assessed                                     General Comments       Exercises     Shoulder Instructions      Home Living Family/patient expects to be discharged to:: Private residence Living Arrangements: Alone Available Help at Discharge: Family Type of Home: House Home Access: Level entry     Home Layout: One level     Bathroom Shower/Tub: Chief Strategy Officer: Standard Bathroom Accessibility: No How Accessible: (furniture walks) Home Equipment: Environmental consultant - 2 wheels;Wheelchair - manual   Additional Comments: Patient reqorts her bathroom is not very big. She has been using a wheelchair becuase of pain but can not fit it in the bathroom      Prior Functioning/Environment Level of Independence: Independent        Comments: Patient was independent but over the past 2 weeks she has been using a wheelchair because of pain. When she walks more then 20' her pain level rises to 10/10. 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         OT Problem List: Decreased strength;Impaired balance (sitting and/or standing);Decreased activity tolerance;Decreased safety awareness;Decreased knowledge of use of DME or AE;Pain;Increased edema      OT Treatment/Interventions: Self-care/ADL training;DME and/or AE instruction;Therapeutic activities;Patient/family education;Balance training    OT Goals(Current goals can be found in the care plan section) Acute Rehab OT Goals Patient Stated Goal: to get back to work  OT Goal Formulation: With patient Time For Goal Achievement: 10/05/18 Potential to Achieve Goals: Good ADL Goals Pt Will Perform Grooming:  with modified independence;standing Pt Will Perform Lower Body Bathing: with modified independence;sit to/from stand Pt Will Perform Lower Body Dressing: with modified independence;sit to/from stand Pt Will Transfer to Toilet: with modified independence;ambulating Pt Will Perform Toileting - Clothing Manipulation and hygiene: with modified independence;sit to/from stand Pt Will Perform Tub/Shower Transfer: Tub transfer;with modified independence;3 in 1;rolling walker  OT Frequency: Min 3X/week   Barriers to D/C: Decreased caregiver support  Pt lives alone and has intermittent assist       Co-evaluation              AM-PAC OT "6 Clicks" Daily Activity     Outcome Measure Help from another person eating meals?: None Help from another person taking care of personal grooming?: None(seated) Help from another person toileting, which includes using toliet, bedpan, or urinal?: A Little Help from another person bathing (including washing, rinsing, drying)?: A Little Help from another person to put on and taking off regular upper body clothing?: None Help from another person to put on and taking off regular lower body clothing?: A Little 6 Click Score: 21   End of Session Equipment Utilized During Treatment:  Gait belt;Rolling walker Nurse Communication: Mobility status  Activity Tolerance: Patient limited by pain Patient left: in bed;with call bell/phone within reach  OT Visit Diagnosis: Unsteadiness on feet (R26.81);Other abnormalities of gait and mobility (R26.89);Muscle weakness (generalized) (M62.81);Pain Pain - Right/Left: Left Pain - part of body: Ankle and joints of foot                Time: 0501-0521 OT Time Calculation (min): 20 min Charges:  OT General Charges $OT Visit: 1 Visit OT Evaluation $OT Eval Moderate Complexity: 1 Mod  Rebecca [name] OTR/L Acute Rehabilitation Services Pager: [phone] Office: [phone]   Rebecca [name] 09/21/2018, 5:51 PM

## 2018-09-21 NOTE — Plan of Care (Signed)

## 2018-09-22 LAB — GLUCOSE, CAPILLARY
Glucose-Capillary: 127 mg/dL — ABNORMAL HIGH (ref 70–99)
Glucose-Capillary: 148 mg/dL — ABNORMAL HIGH (ref 70–99)
Glucose-Capillary: 89 mg/dL (ref 70–99)
Glucose-Capillary: 180 mg/dL — ABNORMAL HIGH (ref 70–99)

## 2018-09-22 NOTE — Progress Notes (Signed)
Physical Therapy Treatment Patient Details Name: Rebecca RaringSusan A [name] MRN: [id number] DOB: Dec 23, 1955 Today's Date: 09/22/2018    History of Present Illness Pt is a 63 y/o female s/p Left femoral to below-knee popliteal bypass with translocated non-reversed great saphenous vein. She has a PMH including CAD, DM HTN, Hypokalemia, Hyponatremia, NSTEMI (11/14/2012), and Tobacco abuse.    PT Comments    Patient is making progress toward PT goals. Pt tolerated increased gait distance this session and reports decreased pain with weight bearing today. Continue to progress as tolerated.    Follow Up Recommendations  Home health PT     Equipment Recommendations  Rolling walker with 5" wheels    Recommendations for Other Services       Precautions / Restrictions Precautions Precautions: None Restrictions Weight Bearing Restrictions: Yes LLE Weight Bearing: Weight bearing as tolerated    Mobility  Bed Mobility               General bed mobility comments: pt OOB in chair upon arrival  Transfers Overall transfer level: Needs assistance Equipment used: Rolling walker (2 wheeled) Transfers: Sit to/from Stand Sit to Stand: Min guard         General transfer comment: min guard for safety; good hand placement demonstrated  Ambulation/Gait Ambulation/Gait assistance: Min guard Gait Distance (Feet): 40 Feet Assistive device: Rolling walker (2 wheeled) Gait Pattern/deviations: Decreased stance time - left;Decreased step length - right;Decreased step length - left;Antalgic Gait velocity: decreased   General Gait Details: cues for upright posture/forward gaze and increased stride length; pt with improving L foot flat   Stairs             Wheelchair Mobility    Modified Rankin (Stroke Patients Only)       Balance Overall balance assessment: Needs assistance Sitting-balance support: Feet supported;Single extremity supported Sitting balance-Leahy Scale: Fair      Standing balance support: Bilateral upper extremity supported Standing balance-Leahy Scale: Poor                              Cognition Arousal/Alertness: Awake/alert Behavior During Therapy: WFL for tasks assessed/performed Overall Cognitive Status: Within Functional Limits for tasks assessed                                        Exercises      General Comments        Pertinent Vitals/Pain Pain Assessment: Faces Faces Pain Scale: Hurts little more Pain Location: left LE and toes  Pain Descriptors / Indicators: Grimacing;Guarding;Aching Pain Intervention(s): Limited activity within patient's tolerance;Monitored during session;Repositioned    Home Living                      Prior Function            PT Goals (current goals can now be found in the care plan section) Acute Rehab PT Goals Patient Stated Goal: to get back to work  Progress towards PT goals: Progressing toward goals    Frequency    Min 3X/week      PT Plan Current plan remains appropriate    Co-evaluation              AM-PAC PT "6 Clicks" Mobility   Outcome Measure  Help needed turning from your back to your side while in a  flat bed without using bedrails?: None Help needed moving from lying on your back to sitting on the side of a flat bed without using bedrails?: None Help needed moving to and from a bed to a chair (including a wheelchair)?: A Little Help needed standing up from a chair using your arms (e.g., wheelchair or bedside chair)?: A Little Help needed to walk in hospital room?: A Little Help needed climbing 3-5 steps with a railing? : A Lot 6 Click Score: 19    End of Session Equipment Utilized During Treatment: Gait belt Activity Tolerance: Patient tolerated treatment well Patient left: in chair;with call bell/phone within reach Nurse Communication: Mobility status PT Visit Diagnosis: Unsteadiness on feet (R26.81);Other abnormalities  of gait and mobility (R26.89);Pain Pain - Right/Left: Left Pain - part of body: Leg     Time: 3335-4562 PT Time Calculation (min) (ACUTE ONLY): 20 min  Charges:  $Gait Training: 8-22 mins                     Rebecca [name], PTA Acute Rehabilitation Services Pager: [phone] Office: [phone]     Rebecca [name] 09/22/2018, 3:42 PM

## 2018-09-22 NOTE — TOC Progression Note (Addendum)
Transition of Care [facility]) - Progression Note    Patient Details  Name: EMBRI FAZEL MRN: [id number] Date of Birth: 1955-09-05  Transition [facility]) CM/SW Contact  Deveron Furlong, RN Phone Number: 09/22/2018, 2:56 PM  Clinical Narrative:    Pt had d/c order 5/22, but reportedly not going home until Sunday or Monday per patient and RN.  Pt states she needs RW and 3n1.  Orders placed and signed by MD.     Barriers to Discharge: Inadequate or no insurance, Barriers Resolved  Expected Discharge Plan and Services  Expected Discharge Date: 09/23/2018 or 09/24/2018               DME Arranged: 3-N-1, Walker rolling DME Agency: [employer] Date DME Agency Contacted: 09/22/18 Time DME Agency Contacted: [phone]

## 2018-09-22 NOTE — Progress Notes (Signed)
   VASCULAR SURGERY ASSESSMENT & PLAN:   POD 2: L FEM BK POP (VEIN): Brisk peroneal signal with the Doppler left leg.  The left second toe is stable.  She will need another day or 2 to mobilize.  DVT PROPHYLAXIS: She is on subcu heparin.  VASCULAR QUALITY INITIATIVE: The patient is on aspirin and a statin.   SUBJECTIVE:   Moderate discomfort.  PHYSICAL EXAM:   Vitals:   09/21/18 2008 09/21/18 2101 09/21/18 2200 09/22/18 0400  BP: (!) 135/53  (!) 123/39 (!) 140/53  Pulse:  73    Resp: 12  14 20   Temp: 98.4 F (36.9 C)   98.8 F (37.1 C)  TempSrc: Oral   Oral  SpO2: 96%   96%  Weight:      Height:       Brisk peroneal signal with the Doppler. The left second toe wound is stable. Her incisions look fine.  LABS:   Lab Results  Component Value Date   WBC 9.9 09/20/2018   HGB 10.0 (L) 09/20/2018   HCT 29.3 (L) 09/20/2018   MCV 86.9 09/20/2018   PLT 248 09/20/2018   Lab Results  Component Value Date   CREATININE 0.86 09/20/2018   Lab Results  Component Value Date   INR 1.1 09/10/2018   CBG (last 3)  Recent Labs    09/21/18 1634 09/21/18 2135 09/22/18 0609  GLUCAP 84 137* 127*    PROBLEM LIST:    Active Problems:   PAD (peripheral artery disease) (HCC)   CURRENT MEDS:   . amLODipine  10 mg Oral Daily  . aspirin  81 mg Oral Daily  . atorvastatin  80 mg Oral q1800  . docusate sodium  100 mg Oral Daily  . gabapentin  300 mg Oral TID  . glyBURIDE  10 mg Oral Q breakfast  . heparin  5,000 Units Subcutaneous Q8H  . lisinopril  20 mg Oral Daily   And  . hydrochlorothiazide  25 mg Oral Daily  . insulin aspart  0-9 Units Subcutaneous TID WC  . metFORMIN  1,000 mg Oral BID WC  . metoprolol tartrate  25 mg Oral BID  . pantoprazole  40 mg Oral Daily    Waverly Ferrari Beeper: [phone] Office: [phone] 09/22/2018

## 2018-09-22 NOTE — Plan of Care (Signed)
Care plans reviewed and patient is progressing.  

## 2018-09-22 NOTE — Progress Notes (Signed)
Occupational Therapy Treatment Patient Details Name: Rebecca [name] MRN: [id number] DOB: 23-Feb-1956 Today's Date: 09/22/2018    History of present illness Pt is a 63 y/o female s/p Left femoral to below-knee popliteal bypass with translocated non-reversed great saphenous vein. She has a PMH including CAD, DM HTN, Hypokalemia, Hyponatremia, NSTEMI (11/14/2012), and Tobacco abuse.   OT comments  Pt progressing towards OT goals this session, able to demonstrate LB dressing at min guard EOB, bed mobility at supervision and transfers at min guard. Reports improved pain control and mobilizing better today. OT will continue to follow acutely and Pt continues to require HHOT post-acute.    Follow Up Recommendations  Home health OT;Supervision - Intermittent    Equipment Recommendations  3 in 1 bedside commode    Recommendations for Other Services      Precautions / Restrictions Precautions Precautions: None Restrictions Weight Bearing Restrictions: Yes LLE Weight Bearing: Weight bearing as tolerated       Mobility Bed Mobility Overal bed mobility: Needs Assistance Bed Mobility: Sit to Supine       Sit to supine: Supervision   General bed mobility comments: no physical assist needed  Transfers Overall transfer level: Needs assistance Equipment used: Rolling walker (2 wheeled) Transfers: Sit to/from Stand Sit to Stand: Min guard         General transfer comment: min guard for safety; good hand placement demonstrated    Balance Overall balance assessment: Needs assistance Sitting-balance support: Feet supported;Single extremity supported Sitting balance-Leahy Scale: Fair     Standing balance support: Bilateral upper extremity supported Standing balance-Leahy Scale: Poor Standing balance comment: dependent on BUE                           ADL either performed or assessed with clinical judgement   ADL Overall ADL's : Needs assistance/impaired             Lower Body Bathing: Set up;Sitting/lateral leans Lower Body Bathing Details (indicate cue type and reason): able to perform figure 4 once in the bed to remove socks         Toilet Transfer: Min Probation officer Details (indicate cue type and reason): good carryover from previous PT session for hand placement Toileting- Clothing Manipulation and Hygiene: Min guard;Sitting/lateral lean       Functional mobility during ADLs: Min guard;Rolling walker General ADL Comments: improved activity tolerance     Vision       Perception     Praxis      Cognition Arousal/Alertness: Awake/alert Behavior During Therapy: WFL for tasks assessed/performed Overall Cognitive Status: Within Functional Limits for tasks assessed                                          Exercises     Shoulder Instructions       General Comments      Pertinent Vitals/ Pain       Pain Assessment: 0-10 Pain Score: 7  Faces Pain Scale: Hurts little more Pain Location: left LE and toes  Pain Descriptors / Indicators: Grimacing;Guarding;Aching Pain Intervention(s): Monitored during session;Repositioned  Home Living  Prior Functioning/Environment              Frequency  Min 3X/week        Progress Toward Goals  OT Goals(current goals can now be found in the care plan section)  Progress towards OT goals: Progressing toward goals  Acute Rehab OT Goals Patient Stated Goal: to get back to work  OT Goal Formulation: With patient Time For Goal Achievement: 10/05/18 Potential to Achieve Goals: Good  Plan Discharge plan remains appropriate;Frequency remains appropriate    Co-evaluation                 AM-PAC OT "6 Clicks" Daily Activity     Outcome Measure   Help from another person eating meals?: None Help from another person taking care of personal grooming?: None(seated) Help  from another person toileting, which includes using toliet, bedpan, or urinal?: A Little Help from another person bathing (including washing, rinsing, drying)?: A Little Help from another person to put on and taking off regular upper body clothing?: None Help from another person to put on and taking off regular lower body clothing?: A Little 6 Click Score: 21    End of Session Equipment Utilized During Treatment: Gait belt;Rolling walker  OT Visit Diagnosis: Unsteadiness on feet (R26.81);Other abnormalities of gait and mobility (R26.89);Muscle weakness (generalized) (M62.81);Pain Pain - Right/Left: Left Pain - part of body: Ankle and joints of foot   Activity Tolerance Patient tolerated treatment well   Patient Left in bed;with call bell/phone within reach   Nurse Communication Mobility status        Time: [id number] OT Time Calculation (min): 21 min  Charges: OT General Charges $OT Visit: 1 Visit OT Treatments $Self Care/Home Management : 8-22 mins  Sherryl MangesLaura Alzada Brazee OTR/L Acute Rehabilitation Services Pager: [phone] Office: [phone]   Evern BioLaura J Chia Rock 09/22/2018, 5:04 PM

## 2018-09-23 LAB — GLUCOSE, CAPILLARY
Glucose-Capillary: 137 mg/dL — ABNORMAL HIGH (ref 70–99)
Glucose-Capillary: 104 mg/dL — ABNORMAL HIGH (ref 70–99)
Glucose-Capillary: 113 mg/dL — ABNORMAL HIGH (ref 70–99)
Glucose-Capillary: 81 mg/dL (ref 70–99)
Glucose-Capillary: 168 mg/dL — ABNORMAL HIGH (ref 70–99)

## 2018-09-23 NOTE — Plan of Care (Signed)
Care plans reviewed and patient is progressing.  

## 2018-09-23 NOTE — Progress Notes (Signed)
   VASCULAR SURGERY ASSESSMENT & PLAN:   POD 3: L FEM BK POP (VEIN): Brisk peroneal signal with the Doppler left leg.  The left second toe is stable.  She will need another day to mobilize.  Anticipate discharge tomorrow.  DVT PROPHYLAXIS: She is on subcu heparin.  VASCULAR QUALITY INITIATIVE: The patient is on aspirin and a statin.  SUBJECTIVE:   Pain under better control.  She was able to ambulate some yesterday.  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 Brisk peroneal signal with the Doppler. Her incisions look fine.  LABS:   Lab Results  Component Value Date   WBC 9.9 09/20/2018   HGB 10.0 (L) 09/20/2018   HCT 29.3 (L) 09/20/2018   MCV 86.9 09/20/2018   PLT 248 09/20/2018   Lab Results  Component Value Date   CREATININE 0.86 09/20/2018   Lab Results  Component Value Date   INR 1.1 09/10/2018   CBG (last 3)  Recent Labs    09/22/18 1559 09/22/18 2037 09/23/18 0535  GLUCAP 89 180* 137*    PROBLEM LIST:    Active Problems:   PAD (peripheral artery disease) (HCC)   CURRENT MEDS:   . amLODipine  10 mg Oral Daily  . aspirin  81 mg Oral Daily  . atorvastatin  80 mg Oral q1800  . docusate sodium  100 mg Oral Daily  . gabapentin  300 mg Oral TID  . glyBURIDE  10 mg Oral Q breakfast  . heparin  5,000 Units Subcutaneous Q8H  . lisinopril  20 mg Oral Daily   And  . hydrochlorothiazide  25 mg Oral Daily  . insulin aspart  0-9 Units Subcutaneous TID WC  . metFORMIN  1,000 mg Oral BID WC  . metoprolol tartrate  25 mg Oral BID  . pantoprazole  40 mg Oral Daily    Waverly Ferrari Beeper: [phone] Office:  [phone] 09/23/2018

## 2018-09-24 LAB — GLUCOSE, CAPILLARY
Glucose-Capillary: 114 mg/dL — ABNORMAL HIGH (ref 70–99)
Glucose-Capillary: 187 mg/dL — ABNORMAL HIGH (ref 70–99)
Glucose-Capillary: 98 mg/dL (ref 70–99)
Glucose-Capillary: 111 mg/dL — ABNORMAL HIGH (ref 70–99)

## 2018-09-24 MED ORDER — CIPROFLOXACIN HCL 500 MG PO TABS
500.0000 mg | ORAL_TABLET | Freq: Two times a day (BID) | ORAL | Status: DC
  Administered 2018-09-24 – 2018-09-25 (×3): 500 mg via ORAL
  Filled 2018-09-24 (×3): qty 1

## 2018-09-24 MED ORDER — CIPROFLOXACIN HCL 500 MG PO TABS: 500.0000 mg | ORAL_TABLET | Freq: Two times a day (BID) | ORAL | 1 refills | Status: DC

## 2018-09-24 NOTE — Discharge Instructions (Signed)
 [facility]   Discharge instructions  Lower Extremity Bypass Surgery  Please refer to the following instruction for your post-procedure care. Your surgeon or physician assistant will discuss any changes with you.  Activity  You are encouraged to walk as much as you can. You can slowly return to normal activities during the month after your surgery. Avoid strenuous activity and heavy lifting until your doctor tells you it's OK. Avoid activities such as vacuuming or swinging a golf club. Do not drive until your doctor give the OK and you are no longer taking prescription pain medications. It is also normal to have difficulty with sleep habits, eating and bowel movement after surgery. These will go away with time.  Bathing/Showering  Shower daily after you go home. Do not soak in a bathtub, hot tub, or swim until the incision heals completely.  Incision Care  Clean your incision with mild soap and water. Shower every day. Pat the area dry with a clean towel. You do not need a bandage unless otherwise instructed. Do not apply any ointments or creams to your incision. If you have open wounds you will be instructed how to care for them or a visiting nurse may be arranged for you. If you have staples or sutures along your incision they will be removed at your post-op appointment. You may have skin glue on your incision. Do not peel it off. It will come off on its own in about one week.  Wash the groin wound with soap and water daily and pat dry. (No tub bath-only shower)  Then put a dry gauze or washcloth in the groin to keep this area dry to help prevent wound infection.  Do this daily and as needed.  Do not use Vaseline or neosporin on your incisions.  Only use soap and water on your incisions and then protect and keep dry.  Diet  Resume your normal diet. There are no special food restrictions following this procedure. A low fat/ low cholesterol diet is  recommended for all patients with vascular disease. In order to heal from your surgery, it is CRITICAL to get adequate nutrition. Your body requires vitamins, minerals, and protein. Vegetables are the best source of vitamins and minerals. Vegetables also provide the perfect balance of protein. Processed food has little nutritional value, so try to avoid this.  Medications  Resume taking all your medications unless your doctor or physician assistant tells you not to. If your incision is causing pain, you may take over-the-counter pain relievers such as acetaminophen (Tylenol). If you were prescribed a stronger pain medication, please aware these medication can cause nausea and constipation. Prevent nausea by taking the medication with a snack or meal. Avoid constipation by drinking plenty of fluids and eating foods with high amount of fiber, such as fruits, vegetables, and grains. Take Colace 100 mg (an over-the-counter stool softener) twice a day as needed for constipation.  Do not take Tylenol if you are taking prescription pain medications.  Follow Up  Our office will schedule a follow up appointment 2-3 weeks following discharge.  Please call us immediately for any of the following conditions  Severe or worsening pain in your legs or feet while at rest or while walking Increase pain, redness, warmth, or drainage (pus) from your incision site(s) Fever of 101 degree or higher The swelling in your leg with the bypass suddenly worsens and becomes more painful than when you were in the hospital If you have   been instructed to feel your graft pulse then you should do so every day. If you can no longer feel this pulse, call the office immediately. Not all patients are given this instruction.  Leg swelling is common after leg bypass surgery.  The swelling should improve over a few months following surgery. To improve the swelling, you may elevate your legs above the level of your heart while you are  sitting or resting. Your surgeon or physician assistant may ask you to apply an ACE wrap or wear compression (TED) stockings to help to reduce swelling.  Reduce your risk of vascular disease  Stop smoking. If you would like help call [facility] at 1-800-QUIT-NOW (1-800-784-8669) or [place] at 336-586-4000.  Manage your cholesterol Maintain a desired weight Control your diabetes weight Control your diabetes Keep your blood pressure down  If you have any questions, please call the office at [phone]  

## 2018-09-24 NOTE — Progress Notes (Signed)
Occupational Therapy Treatment Patient Details Name: Rebecca LEHAN MRN: [id number] DOB: 1956-03-16 Today's Date: 09/24/2018    History of present illness Pt is a 63 y/o female s/p Left femoral to below-knee popliteal bypass with translocated non-reversed great saphenous vein. She has a PMH including CAD, DM HTN, Hypokalemia, Hyponatremia, NSTEMI (11/14/2012), and Tobacco abuse.   OT comments  Pt progressing towards OT goals this session. Focus was on AE education. Pt was provided with "hip kit" and demonstrated use of reacher/grabber, sock aide, and verbalized understanding of long handle shoe horn and long handle sponge. Limited transfers today because LLE is still "calming down" from walking with PT earlier   Follow Up Recommendations  Home health OT;Supervision - Intermittent    Equipment Recommendations  3 in 1 bedside commode(AE provided for patient (in room already))    Recommendations for Other Services      Precautions / Restrictions Precautions Precautions: Fall Restrictions Weight Bearing Restrictions: Yes LLE Weight Bearing: Weight bearing as tolerated       Mobility Bed Mobility               General bed mobility comments: Pt in recliner at beginning and end of session  Transfers Overall transfer level: Needs assistance Equipment used: Rolling walker (2 wheeled) Transfers: Sit to/from Stand Sit to Stand: Min guard         General transfer comment: increased time, pushed up from chair, limited L LE WBing due to pain    Balance Overall balance assessment: Needs assistance Sitting-balance support: Feet supported;No upper extremity supported Sitting balance-Leahy Scale: Good     Standing balance support: Bilateral upper extremity supported Standing balance-Leahy Scale: Poor Standing balance comment: dependent on RW/Bilat UE support due to L foot pain                           ADL either performed or assessed with clinical judgement    ADL Overall ADL's : Needs assistance/impaired             Lower Body Bathing: Set up;Sitting/lateral leans Lower Body Bathing Details (indicate cue type and reason): educated and provided with long handle sponge     Lower Body Dressing: Min guard;With adaptive equipment;Cueing for sequencing;Sitting/lateral leans Lower Body Dressing Details (indicate cue type and reason): practiced with AE for LB dressing Toilet Transfer: Min guard;Ambulation;RW           Functional mobility during ADLs: Min guard;Rolling walker General ADL Comments: Pt still in pain from PT session so mobility limited this session     Vision       Perception     Praxis      Cognition Arousal/Alertness: Awake/alert Behavior During Therapy: WFL for tasks assessed/performed Overall Cognitive Status: Within Functional Limits for tasks assessed                                          Exercises Exercises: Other exercises Other Exercises Other Exercises: AAROM to L ankle into DF, was able to get to neutral, minimal active ankle flex/ext Other Exercises: quad sets x 10 reps at L knee   Shoulder Instructions       General Comments L LE surgical incisions and necrotic 2nd toe tip    Pertinent Vitals/ Pain       Pain Assessment: 0-10 Pain Score: 8  Pain  Location: L LE specifically at first 3 tops, feels like they're splitting apart Pain Descriptors / Indicators: Grimacing;Guarding;Aching Pain Intervention(s): Limited activity within patient's tolerance;Monitored during session  Home Living                                          Prior Functioning/Environment              Frequency  Min 3X/week        Progress Toward Goals  OT Goals(current goals can now be found in the care plan section)  Progress towards OT goals: Progressing toward goals  Acute Rehab OT Goals Patient Stated Goal: to get back to work  OT Goal Formulation: With patient Time  For Goal Achievement: 10/05/18 Potential to Achieve Goals: Good  Plan Discharge plan remains appropriate;Frequency remains appropriate    Co-evaluation                 AM-PAC OT "6 Clicks" Daily Activity     Outcome Measure   Help from another person eating meals?: None Help from another person taking care of personal grooming?: None(seated) Help from another person toileting, which includes using toliet, bedpan, or urinal?: A Little Help from another person bathing (including washing, rinsing, drying)?: A Little Help from another person to put on and taking off regular upper body clothing?: None Help from another person to put on and taking off regular lower body clothing?: A Little 6 Click Score: 21    End of Session Equipment Utilized During Treatment: Gait belt;Rolling walker  OT Visit Diagnosis: Unsteadiness on feet (R26.81);Other abnormalities of gait and mobility (R26.89);Muscle weakness (generalized) (M62.81);Pain Pain - Right/Left: Left Pain - part of body: Ankle and joints of foot   Activity Tolerance Patient tolerated treatment well   Patient Left in chair;with call bell/phone within reach   Nurse Communication Mobility status        Time: 9861-4830 OT Time Calculation (min): 21 min  Charges: OT General Charges $OT Visit: 1 Visit OT Treatments $Self Care/Home Management : 8-22 mins  Hulda Humphrey OTR/L Acute Rehabilitation Services Pager: [phone] Office: [place] 09/24/2018, 1:43 PM

## 2018-09-24 NOTE — Plan of Care (Signed)

## 2018-09-24 NOTE — Progress Notes (Addendum)
  Progress Note    09/24/2018 8:06 AM 4 Days Post-Op  Subjective:  C/o pain in her left great, 2nd and 3rd toes.    Afebrile HR 50's-60's NSR 120's-140's systolic 99% RA  Vitals:   09/23/18 2346 09/24/18 0340  BP: (!) 121/46 (!) 147/75  Pulse: 62 60  Resp: 13 14  Temp: 98.9 F (37.2 C) 98.4 F (36.9 C)  SpO2: 96% 99%    Physical Exam: Cardiac:  regular Lungs:  Non labored Incisions:  Left groin and all other incisions healing nicely Extremities:  Monophasic left DP/PT and brisk peroneal doppler signals    Abdomen:  Soft, NT/ND  CBC    Component Value Date/Time   WBC 9.9 09/20/2018 2358   RBC 3.37 (L) 09/20/2018 2358   HGB 10.0 (L) 09/20/2018 2358   HGB 11.9 11/30/2017 1351   HCT 29.3 (L) 09/20/2018 2358   HCT 36.7 11/30/2017 1351   PLT 248 09/20/2018 2358   PLT 190 11/30/2017 1351   MCV 86.9 09/20/2018 2358   MCV 92 11/30/2017 1351   MCH 29.7 09/20/2018 2358   MCHC 34.1 09/20/2018 2358   RDW 14.6 09/20/2018 2358   RDW 16.5 (H) 11/30/2017 1351   LYMPHSABS 1.8 08/31/2018 1411   LYMPHSABS 1.4 11/30/2017 1351   MONOABS 0.4 08/31/2018 1411   EOSABS 0.3 08/31/2018 1411   EOSABS 0.1 11/30/2017 1351   BASOSABS 0.1 08/31/2018 1411   BASOSABS 0.0 11/30/2017 1351    BMET    Component Value Date/Time   NA 134 (L) 09/20/2018 2358   NA 135 08/27/2018 1141   K 4.0 09/20/2018 2358   CL 96 (L) 09/20/2018 2358   CO2 24 09/20/2018 2358   GLUCOSE 150 (H) 09/20/2018 2358   BUN 11 09/20/2018 2358   BUN 14 08/27/2018 1141   CREATININE 0.86 09/20/2018 2358   CALCIUM 8.7 (L) 09/20/2018 2358   GFRNONAA >60 09/20/2018 2358   GFRAA >60 09/20/2018 2358    INR    Component Value Date/Time   INR 1.1 09/10/2018 1047     Intake/Output Summary (Last 24 hours) at 09/24/2018 0806 Last data filed at 09/24/2018 0340 Gross per 24 hour  Intake 1440 ml  Output 4200 ml  Net -2760 ml     Assessment:  63 y.o. 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  Discussed keeping groin incision dry unless in shower -constipation:  Has not had a BM-had Senokot last evening.  If no relief will give dulcolax supp.  Also discussed with pt to continue stool softeners while taking pain medication. -DVT prophylaxis:  Sq heparin -possibly home later today.   Doreatha Massed, PA-C [facility] [phone] 09/24/2018 8:06 AM  I have interviewed the patient and examined the patient. I agree with the findings by the PA. She has a brisk peroneal signal with the Doppler.  She has some mild cellulitis on her foot so we will start her on Cipro as she has a penicillin allergy.  If she is able to move her bowels we will plan on discharging her later this afternoon.  Cari Caraway, MD [phone]

## 2018-09-24 NOTE — Progress Notes (Signed)
Physical Therapy Treatment Patient Details Name: Rebecca RaringSusan A [name] MRN: [id number] DOB: 03/27/1956 Today's Date: 09/24/2018    History of Present Illness Pt is a 63 y/o female s/p Left femoral to below-knee popliteal bypass with translocated non-reversed great saphenous vein. She has a PMH including CAD, DM HTN, Hypokalemia, Hyponatremia, NSTEMI (11/14/2012), and Tobacco abuse.    PT Comments    Pt with improved ambulation compared to last visit however remains very limited due to L foot/toe 10/10 pain. Pt with minimal active L ankle movement, focused on AA and passive stretch of L ankle into DF. Gave pt a HEP for L LE to promote PROM and aide with pain management. Acute PT to cont to follow.    Follow Up Recommendations  Home health PT     Equipment Recommendations  Rolling walker with 5" wheels    Recommendations for Other Services       Precautions / Restrictions Precautions Precautions: Fall Restrictions Weight Bearing Restrictions: Yes LLE Weight Bearing: Weight bearing as tolerated    Mobility  Bed Mobility               General bed mobility comments: pt sitting up in chair upon PT arrival  Transfers Overall transfer level: Needs assistance Equipment used: Rolling walker (2 wheeled) Transfers: Sit to/from Stand Sit to Stand: Min guard         General transfer comment: increased time, pushed up from chair, limited L LE WBing due to pain  Ambulation/Gait Ambulation/Gait assistance: Min guard Gait Distance (Feet): 100 Feet Assistive device: Rolling walker (2 wheeled) Gait Pattern/deviations: Decreased stance time - left;Antalgic;Step-to pattern Gait velocity: dec Gait velocity interpretation: <1.31 ft/sec, indicative of household ambulator General Gait Details: pt with 10/10 L foot pain especially at toes, pt with progressively increased bilat UE support dependence on RW, Encouraged pt to step with foot flat vs supinated   Stairs              Wheelchair Mobility    Modified Rankin (Stroke Patients Only)       Balance Overall balance assessment: Needs assistance Sitting-balance support: Feet supported;No upper extremity supported Sitting balance-Leahy Scale: Good     Standing balance support: Bilateral upper extremity supported Standing balance-Leahy Scale: Poor Standing balance comment: dependent on RW/Bilat UE support due to L foot pain                            Cognition Arousal/Alertness: Awake/alert Behavior During Therapy: WFL for tasks assessed/performed Overall Cognitive Status: Within Functional Limits for tasks assessed                                        Exercises Other Exercises Other Exercises: AAROM to L ankle into DF, was able to get to neutral, minimal active ankle flex/ext Other Exercises: quad sets x 10 reps at L knee    General Comments General comments (skin integrity, edema, etc.): L LE surgical incisions and necrotic 2nd toe tip      Pertinent Vitals/Pain Pain Assessment: 0-10 Pain Score: 10-Worst pain ever Pain Location: L LE specifically at first 3 tops, feels like they're splitting apart Pain Descriptors / Indicators: Grimacing;Guarding;Aching Pain Intervention(s): Monitored during session    Home Living  Prior Function            PT Goals (current goals can now be found in the care plan section) Progress towards PT goals: Progressing toward goals    Frequency    Min 3X/week      PT Plan Current plan remains appropriate    Co-evaluation              AM-PAC PT "6 Clicks" Mobility   Outcome Measure  Help needed turning from your back to your side while in a flat bed without using bedrails?: None Help needed moving from lying on your back to sitting on the side of a flat bed without using bedrails?: None Help needed moving to and from a bed to a chair (including a wheelchair)?: A Little Help  needed standing up from a chair using your arms (e.g., wheelchair or bedside chair)?: A Little Help needed to walk in hospital room?: A Little Help needed climbing 3-5 steps with a railing? : A Lot 6 Click Score: 19    End of Session Equipment Utilized During Treatment: Gait belt Activity Tolerance: Patient limited by pain Patient left: in chair;with call bell/phone within reach Nurse Communication: Mobility status PT Visit Diagnosis: Unsteadiness on feet (R26.81);Difficulty in walking, not elsewhere classified (R26.2);Pain Pain - Right/Left: Left Pain - part of body: Ankle and joints of foot     Time: 8032-1224 PT Time Calculation (min) (ACUTE ONLY): 25 min  Charges:  $Gait Training: 8-22 mins $Therapeutic Exercise: 8-22 mins                     Lewis Shock, PT, DPT Acute Rehabilitation Services Pager #: [phone] Office #: [phone]    Iona Hansen 09/24/2018, 11:07 AM

## 2018-09-24 NOTE — Discharge Summary (Signed)
Discharge Summary     Rebecca RaringSusan A [name] 12/18/1955 63 y.o. female  [id number]  Admission Date: 09/20/2018  Discharge Date: 09/25/2018  Physician: Larina EarthlyEarly, Todd F, MD  Admission Diagnosis: PAD (peripheral artery disease) (HCC) [I73.9]   HPI:   This is a 63 y.o. female who presents to the ED today with rest pain in the left foot.  She states that this has been going on for at least 3 weeks.  She thought that she had gout and she has been going to her PCP and has received solu-medrol and lotrisone cream.  They checked her uric acid and it was normal and asked her to go to the urgent care.  There, they were unable to locate pulses in her foot and was directed to the ER.  She states that nothing makes the pain better and it is constant.  She also c/o pain in her toes.    She did have a CTA with runoff, which revealed a high grade SFA occlusion on the left as well as a 7cm long occlusion of the distal SFA extending into the popliteal artery.    The pt is on a statin for cholesterol management.  The pt is on a daily aspirin.   Other AC:  none The pt is on CCB, BB and ACEI for hypertension.   The pt is diabetic.  On oral agents Tobacco hx:  Remote-quit January 2019  Hospital Course:  The patient was admitted to the hospital and taken to the operating room on 09/20/2018 and underwent:  Left femoral to below-knee popliteal bypass with translocated non-reversed great saphenous vein    The pt tolerated the procedure well and was transported to the PACU in good condition.   By POD 1, the pt's pain was much improved from pre op pain.  She had a brisk PT/DP doppler signal left foot.  She has single vessel peroneal runoff with excellent flow into her foot by doppler.   ABI's 09/21/2018: Right: 0.82 Left:  0.63  POD 2, she continued to have brisk doppler signals.  Needs a few more days for mobilization.  Left 2nd toe is stable.  She is on statin/asa.   POD 4, she had c/o pain in her left  great toe, 2nd and 3rd toes.  Mild celulitis on her foot and po Cipro was started due to pcn allergy.    She had not had a BM and was concerned. She had Senna the night before.  If no relief, she will be given a dulcolax.    POD 5, pt doing well.  She did not want HH to come to her house.  She has RW and BSC.  F/u with Dr. Arbie CookeyEarly in 2-3 weeks.   The remainder of the hospital course consisted of increasing mobilization and increasing intake of solids without difficulty.  CBC    Component Value Date/Time   WBC 9.9 09/20/2018 2358   RBC 3.37 (L) 09/20/2018 2358   HGB 10.0 (L) 09/20/2018 2358   HGB 11.9 11/30/2017 1351   HCT 29.3 (L) 09/20/2018 2358   HCT 36.7 11/30/2017 1351   PLT 248 09/20/2018 2358   PLT 190 11/30/2017 1351   MCV 86.9 09/20/2018 2358   MCV 92 11/30/2017 1351   MCH 29.7 09/20/2018 2358   MCHC 34.1 09/20/2018 2358   RDW 14.6 09/20/2018 2358   RDW 16.5 (H) 11/30/2017 1351   LYMPHSABS 1.8 08/31/2018 1411   LYMPHSABS 1.4 11/30/2017 1351   MONOABS 0.4 08/31/2018 1411  EOSABS 0.3 08/31/2018 1411   EOSABS 0.1 11/30/2017 1351   BASOSABS 0.1 08/31/2018 1411   BASOSABS 0.0 11/30/2017 1351    BMET    Component Value Date/Time   NA 134 (L) 09/20/2018 2358   NA 135 08/27/2018 1141   K 4.0 09/20/2018 2358   CL 96 (L) 09/20/2018 2358   CO2 24 09/20/2018 2358   GLUCOSE 150 (H) 09/20/2018 2358   BUN 11 09/20/2018 2358   BUN 14 08/27/2018 1141   CREATININE 0.86 09/20/2018 2358   CALCIUM 8.7 (L) 09/20/2018 2358   GFRNONAA >60 09/20/2018 2358   GFRAA >60 09/20/2018 2358       Discharge Diagnosis:  PAD (peripheral artery disease) (HCC) [I73.9]  Secondary Diagnosis: Patient Active Problem List   Diagnosis Date Noted  . PAD (peripheral artery disease) (HCC) 09/05/2018  . Hypertensive emergency 11/14/2012  . Hypokalemia 11/14/2012  . NSTEMI (non-ST elevated myocardial infarction) (HCC) 11/14/2012  . Hyponatremia 04/08/2012  . Hypertension 04/06/2012  . Chest  pain 04/06/2012  . DM (diabetes mellitus), type 2, uncontrolled with complications (HCC) 04/06/2012  . Tobacco abuse 04/06/2012  . Mixed hyperlipidemia 04/06/2012   Past Medical History:  Diagnosis Date  . CAD (coronary artery disease)   . Diabetes mellitus without complication (HCC)   . DM (diabetes mellitus), type 2, uncontrolled with complications (HCC) 04/06/2012  . Hypertension   . Hypertensive emergency 11/14/2012  . Hypokalemia 11/14/2012  . Hyponatremia 04/08/2012  . Mixed hyperlipidemia 04/06/2012  . NSTEMI (non-ST elevated myocardial infarction) (HCC) 11/14/2012  . Tobacco abuse 04/06/2012     Allergies as of 09/24/2018      Reactions   Penicillins Rash, Other (See Comments)   Did it involve swelling of the face/tongue/throat, SOB, or low BP? No Did it involve sudden or SEVERE rash/hives, skin peeling, or any reaction on the inside of your mouth or nose? #  #  #  YES  #  #  #  Did you need to seek medical attention at a hospital or doctor's office? #  #  #  YES  #  #  #  When did it last happen? Unknown      Medication List    TAKE these medications   amLODipine 10 MG tablet Commonly known as:  NORVASC Take 1 tablet (10 mg total) by mouth daily.   aspirin 81 MG chewable tablet Chew 81 mg by mouth daily.   atorvastatin 80 MG tablet Commonly known as:  LIPITOR Take 1 tablet (80 mg total) by mouth daily at 6 PM.   ciprofloxacin 500 MG tablet Commonly known as:  CIPRO Take 1 tablet (500 mg total) by mouth 2 (two) times daily.   gabapentin 300 MG capsule Commonly known as:  NEURONTIN Take 1 capsule (300 mg total) by mouth 3 (three) times daily. Take 1 cap po qhs x 1 week. increase to 1 cap po bid x 1 week. 1 cap PO TID   glyBURIDE 5 MG tablet Commonly known as:  DIABETA Take 2 tablets (10 mg total) by mouth daily with breakfast.   HYDROcodone-acetaminophen 5-325 MG tablet Commonly known as:  Norco Take 1 tablet by mouth every 6 (six) hours as needed for severe  pain.   Krill Oil 1000 MG Caps Take 1,000 mg by mouth daily.   lisinopril-hydrochlorothiazide 20-25 MG tablet Commonly known as:  ZESTORETIC Take 1 tablet by mouth daily.   metFORMIN 1000 MG tablet Commonly known as:  GLUCOPHAGE Take 1 tablet (1,000 mg  total) by mouth 2 (two) times daily with a meal.   metoprolol tartrate 25 MG tablet Commonly known as:  LOPRESSOR Take 1 tablet (25 mg total) by mouth 2 (two) times daily.   oxyCODONE-acetaminophen 5-325 MG tablet Commonly known as:  PERCOCET/ROXICET Take 1 tablet by mouth every 6 (six) hours as needed for severe pain. What changed:  when to take this            Durable Medical Equipment  (From admission, onward)         Start     Ordered   09/22/18 0906  For home use only DME Walker rolling  Once    Question:  Patient needs a walker to treat with the following condition  Answer:  Weakness   09/22/18 0905   09/22/18 0905  For home use only DME 3 n 1  Once     09/22/18 0905          Discharge Instructions: [facility] Discharge instructions Lower Extremity Bypass Surgery  Please refer to the following instruction for your post-procedure care. Your surgeon or physician assistant will discuss any changes with you.  Activity  You are encouraged to walk as much as you can. You can slowly return to normal activities during the month after your surgery. Avoid strenuous activity and heavy lifting until your doctor tells you it's OK. Avoid activities such as vacuuming or swinging a golf club. Do not drive until your doctor give the OK and you are no longer taking prescription pain medications. It is also normal to have difficulty with sleep habits, eating and bowel movement after surgery. These will go away with time.  Bathing/Showering  You may shower after you go home. Do not soak in a bathtub, hot tub, or swim until the incision heals completely.  Incision Care  Clean your incision  with mild soap and water. Shower every day. Pat the area dry with a clean towel. You do not need a bandage unless otherwise instructed. Do not apply any ointments or creams to your incision. If you have open wounds you will be instructed how to care for them or a visiting nurse may be arranged for you. If you have staples or sutures along your incision they will be removed at your post-op appointment. You may have skin glue on your incision. Do not peel it off. It will come off on its own in about one week.  Wash the groin wound with soap and water daily and pat dry. (No tub bath-only shower)  Then put a dry gauze or washcloth in the groin to keep this area dry to help prevent wound infection.  Do this daily and as needed.  Do not use Vaseline or neosporin on your incisions.  Only use soap and water on your incisions and then protect and keep dry.  Diet  Resume your normal diet. There are no special food restrictions following this procedure. A low fat/ low cholesterol diet is recommended for all patients with vascular disease. In order to heal from your surgery, it is CRITICAL to get adequate nutrition. Your body requires vitamins, minerals, and protein. Vegetables are the best source of vitamins and minerals. Vegetables also provide the perfect balance of protein. Processed food has little nutritional value, so try to avoid this.  Medications  Resume taking all your medications unless your doctor or Physician Assistant tells you not to. If your incision is causing pain, you may take over-the-counter pain  relievers such as acetaminophen (Tylenol). If you were prescribed a stronger pain medication, please aware these medication can cause nausea and constipation. Prevent nausea by taking the medication with a snack or meal. Avoid constipation by drinking plenty of fluids and eating foods with high amount of fiber, such as fruits, vegetables, and grains. Take Colace 100 mg (an over-the-counter stool  softener) twice a day as needed for constipation.  Do not take Tylenol if you are taking prescription pain medications.  Follow Up  Our office will schedule a follow up appointment 2-3 weeks following discharge.  Please call us immediately for any of the following conditions  .Severe or worsening pain in your legs or feet while at rest or while walking .Increase pain, redness, warmth, or drainage (pus) from your incision site(s) . Fever of 101 degree or higher . The swelling in your leg with the bypass suddenly worsens and becomes more painful than when you were in the hospital . If you have been instructed to feel your graft pulse then you should do so every day. If you can no longer feel this pulse, call the office immediately. Not all patients are given this instruction. .  Leg swelling is common after leg bypass surgery.  The swelling should improve over a few months following surgery. To improve the swelling, you may elevate your legs above the level of your heart while you are sitting or resting. Your surgeon or physician assistant may ask you to apply an ACE wrap or wear compression (TED) stockings to help to reduce swelling.  Reduce your risk of vascular disease  Stop smoking. If you would like help call [facility] at 1-800-QUIT-NOW (858-169-5120) or [place] at 5715727268.  . Manage your cholesterol . Maintain a desired weight . Control your diabetes weight . Control your diabetes . Keep your blood pressure down .  If you have any questions, please call the office at [phone]   Prescriptions given: 1.  Roxicet #30 No Refill 2.  cipro  bid #14 one refill (both sent to [facility])  Disposition: home  Patient's condition: is Good  Follow up: 1. Dr. Arbie Cookey in 2-3 weeks   Doreatha Massed, PA-C [facility] [phone] 09/24/2018  9:13 AM  - For VQI Registry use ---   Post-op:  Wound infection: No  Graft infection: No   Transfusion: No    If yes, n/a units given New Arrhythmia: No Ipsilateral amputation: No,  Minor,  BKA,  AKA Discharge patency: [x ] Primary,  Primary assisted,  Secondary,  Occluded Patency judged by: [x ] Dopper only,  Palpable graft pulse,  Palpable distal pulse,  ABI inc. > 0.15,  Duplex Discharge ABI: R 0.82, L 0.63 D/C Ambulatory Status: Ambulatory  Complications: MI: No,  Troponin only,  EKG or Clinical CHF: No Resp failure:No,  Pneumonia,  Ventilator Chg in renal function: No,  Inc. Cr > 0.5,  Temp. Dialysis,   Permanent dialysis Stroke: No,  Minor,  Major Return to OR: No  Reason for return to OR:  Bleeding,  Infection,  Thrombosis,  Revision  Discharge medications: Statin use:  yes ASA use:  yes Plavix use:  no Beta blocker use: yes CCB use:  Yes ACEI use:   yes ARB use:  no Coumadin use: no

## 2018-09-25 LAB — GLUCOSE, CAPILLARY: Glucose-Capillary: 70 mg/dL (ref 70–99)

## 2018-09-25 MED ORDER — OXYCODONE-ACETAMINOPHEN 5-325 MG PO TABS: 1.0000 | ORAL_TABLET | Freq: Four times a day (QID) | ORAL | 0 refills | Status: DC | PRN

## 2018-09-25 MED ORDER — OXYCODONE-ACETAMINOPHEN 5-325 MG PO TABS: 1.0000 | ORAL_TABLET | ORAL | 0 refills | Status: DC | PRN

## 2018-09-25 MED ORDER — CIPROFLOXACIN HCL 500 MG PO TABS: 500.0000 mg | ORAL_TABLET | Freq: Two times a day (BID) | ORAL | 1 refills | Status: DC

## 2018-09-25 NOTE — Progress Notes (Addendum)
[facility] [place]  Subjective  - Doing better passing gas, no BM   Objective (!) 101/58 (!) 56 98.2 F (36.8 C) (Oral) 19 97%  Intake/Output Summary (Last 24 hours) at 09/25/2018 0735 Last data filed at 09/24/2018 2332 Gross per 24 hour  Intake 750 ml  Output 900 ml  Net -150 ml    Doppler left DP/PT/peroneal Incisions all healing well, left groin soft without hematoma. Second toe with dry gangrene, no change Heart RRR Lungs non labored breathing ABI/TBIToday's ABIToday's TBIPrevious ABIPrevious TBI  ABI's 09/21/2018 +-------+-----------+-----------+------------+------------+ Right  0.82                                           +-------+-----------+-----------+------------+------------+ Left   0.63                                           +-------+-----------+-----------+------------+------------+   Assessment/Planning: 63 y.o. female is s/p:  Left femoral to below knee bypass grafting with vein  5 Days Post-Op  Patient doesn't want HH.  She Has rolling walker and bedside toilet. F/U in 2-3 weeks with Dr. Johnsie Cancel 09/25/2018 7:35 AM --  Laboratory Lab Results: No results for input(s): WBC, HGB, HCT, PLT in the last 72 hours. BMET No results for input(s): NA, K, CL, CO2, GLUCOSE, BUN, CREATININE, CALCIUM in the last 72 hours.  COAG Lab Results  Component Value Date   INR 1.1 09/10/2018   INR 0.94 11/13/2012   No results found for: PTT   I have interviewed and examined patient with PA and agree with assessment and plan above.   Estell Puccini C. Randie Heinz, MD [facility] [place] Office: [phone] Pager: [phone]

## 2018-09-25 NOTE — Progress Notes (Signed)
Physical Therapy Treatment Patient Details Name: Rebecca [name] MRN: [id number] DOB: Apr 14, 1956 Today's Date: 09/25/2018    History of Present Illness Pt is a 63 y/o female s/p Left femoral to below-knee popliteal bypass with translocated non-reversed great saphenous vein. She has a PMH including CAD, DM HTN, Hypokalemia, Hyponatremia, NSTEMI (11/14/2012), and Tobacco abuse.    PT Comments    Pt sitting up in chair practicing ROM exercises when therapist entered. Pt able to teach back HEP, vc for improving quad sets. Pt continues to be limited in safe mobility by increased L foot and toe pain especially with weightbearing. Overall pt is min guard for transfers and ambulation of 130 feet with RW. Pt with increased L foot flat with ambulation. Pt eager to D/c home today. Plans for HHPT remain appropriate.   Follow Up Recommendations  Home health PT     Equipment Recommendations  Rolling walker with 5" wheels       Precautions / Restrictions Precautions Precautions: Fall Restrictions Weight Bearing Restrictions: Yes LLE Weight Bearing: Weight bearing as tolerated    Mobility  Bed Mobility               General bed mobility comments: pt sitting up in chair upon PT arrival  Transfers Overall transfer level: Needs assistance Equipment used: Rolling walker (2 wheeled) Transfers: Sit to/from Stand Sit to Stand: Min guard         General transfer comment: increased time, pushed up from chair, limited L LE WBing due to pain  Ambulation/Gait Ambulation/Gait assistance: Min guard Gait Distance (Feet): 130 Feet Assistive device: Rolling walker (2 wheeled) Gait Pattern/deviations: Decreased stance time - left;Antalgic;Step-to pattern Gait velocity: dec Gait velocity interpretation: <1.8 ft/sec, indicate of risk for recurrent falls General Gait Details: min guard for safety, pt states she is trying hard to attempt L foot flat however still experiencing increased pain with foot  flat      Balance Overall balance assessment: Needs assistance Sitting-balance support: Feet supported;No upper extremity supported Sitting balance-Leahy Scale: Good     Standing balance support: Bilateral upper extremity supported Standing balance-Leahy Scale: Poor Standing balance comment: dependent on RW/Bilat UE support due to L foot pain                            Cognition Arousal/Alertness: Awake/alert Behavior During Therapy: WFL for tasks assessed/performed Overall Cognitive Status: Within Functional Limits for tasks assessed                                        Exercises Other Exercises Other Exercises: AAROM/AROM  to L ankle into DF, pt was working on ROM when therapist entered and was in neutral dorsiflexion with AAROM able to achieve another 5 degrees flexion,  Other Exercises: ankle circles x10 Other Exercises: quad sets x 10 reps at L knee    General Comments General comments (skin integrity, edema, etc.): VSS      Pertinent Vitals/Pain Pain Assessment: 0-10 Pain Score: 8  Pain Location: L LE specifically at first 3 tops, feels like they're splitting apart Pain Descriptors / Indicators: Grimacing;Guarding;Aching Pain Intervention(s): Limited activity within patient's tolerance;Monitored during session;Repositioned           PT Goals (current goals can now be found in the care plan section) Acute Rehab PT Goals PT Goal Formulation: With patient Time  For Goal Achievement: 09/28/18 Potential to Achieve Goals: Good Progress towards PT goals: Progressing toward goals    Frequency    Min 3X/week      PT Plan Current plan remains appropriate       AM-PAC PT "6 Clicks" Mobility   Outcome Measure  Help needed turning from your back to your side while in a flat bed without using bedrails?: None Help needed moving from lying on your back to sitting on the side of a flat bed without using bedrails?: None Help needed  moving to and from a bed to a chair (including a wheelchair)?: A Little Help needed standing up from a chair using your arms (e.g., wheelchair or bedside chair)?: A Little Help needed to walk in hospital room?: A Little Help needed climbing 3-5 steps with a railing? : A Lot 6 Click Score: 19    End of Session Equipment Utilized During Treatment: Gait belt Activity Tolerance: Patient limited by pain Patient left: in chair;with call bell/phone within reach Nurse Communication: Mobility status PT Visit Diagnosis: Unsteadiness on feet (R26.81);Difficulty in walking, not elsewhere classified (R26.2);Pain Pain - Right/Left: Left Pain - part of body: Ankle and joints of foot     Time: [id number] PT Time Calculation (min) (ACUTE ONLY): 31 min  Charges:  $Gait Training: 8-22 mins $Therapeutic Exercise: 8-22 mins                     Skyler Dusing B. Beverely RisenVan [name] PT, DPT Acute Rehabilitation Services Pager [phone] Office [phone]    Rebecca [name] 09/25/2018, 10:04 AM

## 2018-09-25 NOTE — Plan of Care (Signed)
Care Plan has reviewed:  Problem: Clinical Measurements: Goal: Ability to maintain clinical measurements within normal limits will improve Outcome: Progressing   Problem: Clinical Measurements: Goal: Will remain free from infection Outcome: Progressing   Problem: Activity: Goal: Risk for activity intolerance will decrease Outcome: Progressing: able to ambulate with walker and one assistance   Problem: Clinical Measurements: Goal: Cardiovascular complication will be avoided Outcome: Progressing: hemodynamic stable, NSR on monitor, no distress seen.   Problem: Pain Managment: Goal: General experience of comfort will improve Outcome: Progressing:   Problem: Elimination: Constipation, Pt stated her last BM was pre-op.09/19/2018 Goal: Will not experience complications related to bowel motility Outcome:Progressing, Pt stated she passed gas and agreed if no bowel movement with in night shift, she will take Dulcolax suppository tomorrow.  Filiberto Pinks, BSN,RN,PCCN,CMC,CSC

## 2018-09-28 ENCOUNTER — Telehealth: Payer: Self-pay

## 2018-09-28 NOTE — Telephone Encounter (Signed)
Returned call to patient. Stated that she was having pain in three of her toes since surgery. One of them was more red with some white coloring on it more than the others. No oozing or signs of infection. Reassured that sometimes can have increased pain with revascularization. Keep leg elevated and if any signs of infection go to ER over weekend or call us during office hours.

## 2018-10-01 ENCOUNTER — Telehealth: Payer: Self-pay

## 2018-10-01 ENCOUNTER — Telehealth: Payer: Self-pay | Admitting: Cardiology

## 2018-10-01 NOTE — Telephone Encounter (Signed)
Pt called and said that she had called last week and she has an appt for next week, however, over the weekend her toes started getting worse and they are turning really dark and looking necrotic. She said that they are hurting quite a bit.   Also has a concern with the area at her groin where she has sutures.   Appt moved to this week from next in order to evaluate her toes.   Julious Payer, CMA

## 2018-10-02 ENCOUNTER — Ambulatory Visit (INDEPENDENT_AMBULATORY_CARE_PROVIDER_SITE_OTHER): Payer: Self-pay | Admitting: Vascular Surgery

## 2018-10-02 ENCOUNTER — Encounter: Payer: Self-pay | Admitting: Vascular Surgery

## 2018-10-02 ENCOUNTER — Other Ambulatory Visit: Payer: Self-pay

## 2018-10-02 VITALS — BP 149/70 | HR 65 | Temp 97.9°F | Resp 20 | Ht 64.0 in | Wt 160.0 lb

## 2018-10-02 DIAGNOSIS — I739 Peripheral vascular disease, unspecified: Secondary | ICD-10-CM

## 2018-10-02 NOTE — Progress Notes (Signed)
   Patient name: Rebecca [name] MRN: [id number] DOB: 04-Dec-1955 Sex: female  REASON FOR VISIT: Follow-up recent left femoral to below-knee popliteal bypass  HPI: Rebecca [name] is a 63 y.o. female is a for follow-up.  She had critical limb ischemia with gangrenous changes of her left second toe.  Underwent left femoral to below-knee popliteal bypass.  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  She has done well.  She reports no claudication symptoms.  She is continued to have some pain in her foot.  Current Outpatient Medications  Medication Sig Dispense Refill  . amLODipine (NORVASC) 10 MG tablet Take 1 tablet (10 mg total) by mouth daily. 90 tablet 3  . aspirin 81 MG chewable tablet Chew 81 mg by mouth daily.    Marland Kitchen atorvastatin (LIPITOR) 80 MG tablet Take 1 tablet (80 mg total) by mouth daily at 6 PM. 90 tablet 3  . ciprofloxacin (CIPRO) 500 MG tablet Take 1 tablet (500 mg total) by mouth 2 (two) times daily. 14 tablet 1  . gabapentin (NEURONTIN) 300 MG capsule Take 1 capsule (300 mg total) by mouth 3 (three) times daily. Take 1 cap po qhs x 1 week. increase to 1 cap po bid x 1 week. 1 cap PO TID 90 capsule 0  . glyBURIDE (DIABETA) 5 MG tablet Take 2 tablets (10 mg total) by mouth daily with breakfast. 60 tablet 5  . KRILL OIL 1000 MG CAPS Take 1,000 mg by mouth daily.     Marland Kitchen lisinopril-hydrochlorothiazide (ZESTORETIC) 20-25 MG tablet Take 1 tablet by mouth daily. 30 tablet 5  . metFORMIN (GLUCOPHAGE) 1000 MG tablet Take 1 tablet (1,000 mg total) by mouth 2 (two) times daily with a meal. 180 tablet 3  . metoprolol tartrate (LOPRESSOR) 25 MG tablet Take 1 tablet (25 mg total) by mouth 2 (two) times daily. 60 tablet 5  . oxyCODONE-acetaminophen (PERCOCET/ROXICET) 5-325 MG tablet Take 1 tablet by mouth every 6 (six) hours as needed for moderate pain. 30 tablet 0   No current facility-administered  medications for this visit.      PHYSICAL EXAM: Vitals:   10/02/18 0826  BP: (!) 149/70  Pulse: 65  Resp: 20  Temp: 97.9 F (36.6 C)  SpO2: 96%  Weight: 160 lb (72.6 kg)  Height: 5\' 4"  (1.626 m)    GENERAL: The patient is a well-nourished female, in no acute distress. The vital signs are documented above. Her incisions are all healing without difficulty.  She has a 3+ popliteal pulse. He is demarcating her left second toe with dry gangrenous changes   MEDICAL ISSUES: Overall.  Having moderate swelling at the end of the day.  Did explain to her that this is the expected occurrence after femoropopliteal bypass.  She will continue her activity.  We will see her again in 1 month with repeat follow-up of her second toe.  Explained that she may require formal amputation or may have autoamputation of the tip of her toe   Larina Earthly, MD FACS [facility] Tel [phone] Pager [phone]

## 2018-10-05 ENCOUNTER — Telehealth: Payer: Self-pay

## 2018-10-05 NOTE — Telephone Encounter (Signed)
Pt called today asking for a refill on her pain medication. She said that she does still have some left but worries with the weekend coming up. She said that she has not been taking them as often.  Called pt and verified how many pills she had and she was advised to use the pain medications either only at night if able or when she was having the worst pain and substitute with otc meds in between. She understands and agrees.   Will speak with Dr Arbie Cookey on Tuesday regarding refill if he would like to do so.   Julious Payer, CMA

## 2018-10-09 ENCOUNTER — Ambulatory Visit (INDEPENDENT_AMBULATORY_CARE_PROVIDER_SITE_OTHER): Payer: Self-pay | Admitting: Physician Assistant

## 2018-10-09 ENCOUNTER — Other Ambulatory Visit: Payer: Self-pay

## 2018-10-09 ENCOUNTER — Encounter: Payer: Self-pay | Admitting: Vascular Surgery

## 2018-10-09 VITALS — BP 151/73 | HR 68 | Temp 97.2°F | Resp 14 | Ht 64.0 in | Wt 161.6 lb

## 2018-10-09 DIAGNOSIS — I739 Peripheral vascular disease, unspecified: Secondary | ICD-10-CM

## 2018-10-09 MED ORDER — HYDROCODONE-ACETAMINOPHEN 5-325 MG PO TABS: 1.0000 | ORAL_TABLET | Freq: Four times a day (QID) | ORAL | 0 refills | Status: DC | PRN

## 2018-10-09 NOTE — Progress Notes (Signed)
    Postoperative Visit   History of Present Illness   Rebecca [name] is a 63 y.o. year old female who returns to clinic due to persistent pain in the first 3 toes of left foot.  She is status post left femoral to below the knee popliteal artery bypass with vein which was performed by Dr. Donnetta Hutching on 09/20/2018.  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  She has dry gangrene well demarcated left second toe tip.  She is still having pain with ambulation.  She denies any subjective fevers, chills, nausea/vomiting.  She also denies any rest pain.  She is taking an aspirin and statin daily.  She has a follow-up appointment with Dr. early on July 7.  For VQI Use Only   PRE-ADM LIVING: Home  AMB STATUS: Ambulatory   Physical Examination   Vitals:   10/09/18 1447  BP: (!) 151/73  Pulse: 68  Resp: 14  Temp: (!) 97.2 F (36.2 C)  TempSrc: Oral  SpO2: 99%  Weight: 161 lb 9.6 oz (73.3 kg)  Height: 5\' 4"  (1.626 m)    LLEE: All vein harvest incisions, left groin incision, and popliteal incision all well-healed; brisk multiphasic peroneal by Doppler; left foot is warm to touch with good capillary refill; mild to moderate edema of left leg to the level of the knee; well demarcated second toe tip with dry gangrene no sign of infection   Medical Decision Making   Rebecca [name] is a 63 y.o. year old female who presents s/p left femoral to below the knee popliteal artery bypass with vein on 09/20/2018 with persistent pain left toes.  - Bypass remains patent with multiphasic brisk peroneal signal and well perfused left foot - Left second toe tip appears to be well demarcated with dry gangrene however no indication for amputation at this time - Prescribed Norco to help with continued postoperative pain - I do not feel as though she needs a refill with her Cipro as left second toe does  not appear to be infected and there is no other frank sign of infection especially since bypass was done with autologous vein - She will follow-up with Dr. early next month  Dagoberto Ligas PA-C [facility]: [phone]  Clinic MD: Dr. Donnetta Hutching

## 2018-10-18 ENCOUNTER — Telehealth: Payer: Self-pay

## 2018-10-18 NOTE — Telephone Encounter (Signed)
Pt called office wanting a refill on her Hydrocodone. She said that she is still having some discomfort.   Called her back and suggested instead of the narcotics to try using extra strength tylenol and take 2 every 4-6 hours as needed for pain and to use elevation.   Advised her if the pain continued that we could bring her in earlier for her follow up to the office.   Brady Plant, CMA

## 2018-10-29 ENCOUNTER — Telehealth: Payer: Self-pay

## 2018-10-29 MED ORDER — GABAPENTIN 300 MG PO CAPS: 300.0000 mg | ORAL_CAPSULE | Freq: Three times a day (TID) | ORAL | 0 refills | Status: DC

## 2018-10-29 NOTE — Telephone Encounter (Signed)
Meds sent into pharmacy. Thanks!

## 2018-11-06 ENCOUNTER — Encounter: Payer: Self-pay | Admitting: Vascular Surgery

## 2018-11-06 ENCOUNTER — Other Ambulatory Visit: Payer: Self-pay

## 2018-11-06 ENCOUNTER — Ambulatory Visit (INDEPENDENT_AMBULATORY_CARE_PROVIDER_SITE_OTHER): Payer: Self-pay | Admitting: Vascular Surgery

## 2018-11-06 VITALS — BP 156/78 | HR 59 | Temp 96.9°F | Resp 16 | Ht 64.0 in | Wt 159.0 lb

## 2018-11-06 DIAGNOSIS — I739 Peripheral vascular disease, unspecified: Secondary | ICD-10-CM

## 2018-11-06 NOTE — Progress Notes (Signed)
Patient name: Rebecca [name] MRN: [id number] DOB: [birth year] Sex: female  REASON FOR VISIT: Follow-up left femoral to popliteal bypass for critical limb ischemia  HPI: Rebecca [name] is a 63 y.o. female here for follow-up.  She has continued to progress.  She does have neuropathic pain in her left foot for prolonged ischemia.  She reports that this is improving.  She is having some mild swelling as well  Current Outpatient Medications  Medication Sig Dispense Refill  . amLODipine (NORVASC) 10 MG tablet Take 1 tablet (10 mg total) by mouth daily. 90 tablet 3  . aspirin 81 MG chewable tablet Chew 81 mg by mouth daily.    Marland Kitchen atorvastatin (LIPITOR) 80 MG tablet Take 1 tablet (80 mg total) by mouth daily at 6 PM. 90 tablet 3  . gabapentin (NEURONTIN) 300 MG capsule Take 1 capsule (300 mg total) by mouth 3 (three) times daily. Take 1 cap po qhs x 1 week. increase to 1 cap po bid x 1 week. 1 cap PO TID 90 capsule 0  . glyBURIDE (DIABETA) 5 MG tablet Take 2 tablets (10 mg total) by mouth daily with breakfast. 60 tablet 5  . KRILL OIL 1000 MG CAPS Take 1,000 mg by mouth daily.     Marland Kitchen lisinopril-hydrochlorothiazide (ZESTORETIC) 20-25 MG tablet Take 1 tablet by mouth daily. 30 tablet 5  . metFORMIN (GLUCOPHAGE) 1000 MG tablet Take 1 tablet (1,000 mg total) by mouth 2 (two) times daily with a meal. 180 tablet 3  . metoprolol tartrate (LOPRESSOR) 25 MG tablet Take 1 tablet (25 mg total) by mouth 2 (two) times daily. 60 tablet 5  . naproxen (NAPROSYN) 500 MG tablet TAKE 1 TABLET BY MOUTH TWICE DAILY WITH A MEAL FOR 10 DAYS    . atorvastatin (LIPITOR) 40 MG tablet TAKE 1 TABLET BY MOUTH ONCE DAILY AT 6 PM    . HYDROcodone-acetaminophen (NORCO) 5-325 MG tablet Take 1 tablet by mouth every 6 (six) hours as needed for moderate pain. (Patient not taking: Reported on 11/06/2018) 25 tablet 0   No current facility-administered medications for this visit.      PHYSICAL EXAM:  Vitals:   11/06/18 0848 11/06/18 0854  BP: (!) 180/80 (!) 156/78  Pulse: (!) 59 (!) 59  Resp: 16   Temp: (!) 96.9 F (36.1 C)   TempSrc: Temporal   SpO2: 98%   Weight: 159 lb (72.1 kg)   Height: 5\' 4"  (1.626 m)     GENERAL: The patient is a well-nourished female, in no acute distress. The vital signs are documented above. 2+ popliteal graft pulse.  All surgical incisions healing.  She does have dry gangrenous changes on the dorsum of her second toe only.  She has some peeling of the skin on the rest of her foot which is all partial-thickness  MEDICAL ISSUES: Continued discomfort associated with her prolonged ischemia.  We will continue to increase her activity.  She understands that she will not harm her leg with walking.  Will allow her second toe to auto amputate.  She wishes to return to work but does a great deal of standing and therefore would not be able to do this.  She will return as soon as possible and I explained that this probably would be another 4 to 6 weeks as my best estimate.  We will see her again in 6 weeks with noninvasive studies   Rosetta Posner, MD FACS [facility] Tel (  [phone] Pager [phone]

## 2018-12-11 ENCOUNTER — Other Ambulatory Visit: Payer: Self-pay

## 2018-12-11 DIAGNOSIS — I739 Peripheral vascular disease, unspecified: Secondary | ICD-10-CM

## 2018-12-18 ENCOUNTER — Ambulatory Visit (HOSPITAL_COMMUNITY)
Admission: RE | Admit: 2018-12-18 | Discharge: 2018-12-18 | Disposition: A | Discharge status: Home / Self Care | Payer: Self-pay | Source: Ambulatory Visit | Attending: Family | Admitting: Family

## 2018-12-18 ENCOUNTER — Ambulatory Visit (INDEPENDENT_AMBULATORY_CARE_PROVIDER_SITE_OTHER)
Admission: RE | Admit: 2018-12-18 | Discharge: 2018-12-18 | Disposition: A | Discharge status: Home / Self Care | Payer: Self-pay | Source: Ambulatory Visit | Attending: Family | Admitting: Family

## 2018-12-18 ENCOUNTER — Encounter: Payer: Self-pay | Admitting: Vascular Surgery

## 2018-12-18 ENCOUNTER — Other Ambulatory Visit: Payer: Self-pay

## 2018-12-18 ENCOUNTER — Ambulatory Visit (INDEPENDENT_AMBULATORY_CARE_PROVIDER_SITE_OTHER): Payer: Self-pay | Admitting: Vascular Surgery

## 2018-12-18 VITALS — BP 184/78 | HR 55 | Temp 97.1°F | Resp 16 | Ht 64.0 in | Wt 163.0 lb

## 2018-12-18 DIAGNOSIS — I739 Peripheral vascular disease, unspecified: Secondary | ICD-10-CM

## 2018-12-18 NOTE — Progress Notes (Signed)
Patient name: Rebecca [name] Rebecca [name] MRN: [id number] DOB: [birth year] Sex: female  REASON FOR VISIT: Follow-up left femoral to popliteal bypass for critical limb ischemia  HPI: Rebecca [name] is Rebecca 63 y.o. female here today for follow-up.  She had undergone left femoral to below-knee popliteal bypass with saphenous vein on 09/20/2018.  She has dry gangrene of the tip of her left second toe.  She does report some continued discomfort associated with this.  Current Outpatient Medications  Medication Sig Dispense Refill  . aspirin 81 MG chewable tablet Chew 81 mg by mouth daily.    Marland Kitchen. atorvastatin (LIPITOR) 40 MG tablet TAKE 1 TABLET BY MOUTH ONCE DAILY AT 6 PM    . atorvastatin (LIPITOR) 80 MG tablet Take 1 tablet (80 mg total) by mouth daily at 6 PM. 90 tablet 3  . gabapentin (NEURONTIN) 300 MG capsule Take 1 capsule (300 mg total) by mouth 3 (three) times daily. Take 1 cap po qhs x 1 week. increase to 1 cap po bid x 1 week. 1 cap PO TID 90 capsule 0  . glyBURIDE (DIABETA) 5 MG tablet Take 2 tablets (10 mg total) by mouth daily with breakfast. 60 tablet 5  . HYDROcodone-acetaminophen (NORCO) 5-325 MG tablet Take 1 tablet by mouth every 6 (six) hours as needed for moderate pain. 25 tablet 0  . KRILL OIL 1000 MG CAPS Take 1,000 mg by mouth daily.     Marland Kitchen. lisinopril-hydrochlorothiazide (ZESTORETIC) 20-25 MG tablet Take 1 tablet by mouth daily. 30 tablet 5  . metFORMIN (GLUCOPHAGE) 1000 MG tablet Take 1 tablet (1,000 mg total) by mouth 2 (two) times daily with Rebecca meal. 180 tablet 3  . metoprolol tartrate (LOPRESSOR) 25 MG tablet Take 1 tablet (25 mg total) by mouth 2 (two) times daily. 60 tablet 5  . naproxen (NAPROSYN) 500 MG tablet TAKE 1 TABLET BY MOUTH TWICE DAILY WITH Rebecca MEAL FOR 10 DAYS    . amLODipine (NORVASC) 10 MG tablet Take 1 tablet (10 mg total) by mouth daily. 90 tablet 3   No current facility-administered medications for this visit.      PHYSICAL EXAM:  Vitals:   12/18/18 1115  BP: (!) 184/78  Pulse: (!) 55  Resp: 16  Temp: (!) 97.1 F (36.2 C)  TempSrc: Oral  SpO2: 97%  Weight: 163 lb (73.9 kg)  Height: 5\' 4"  (1.626 m)    GENERAL: The patient is Rebecca well-nourished female, in no acute distress. The vital signs are documented above. She is completely healed her incisions.  She has 2-3+ popliteal graft pulse.  Does have moderate swelling in her left foot.  She has Rebecca 1+ dorsalis pedis pulse.  Noninvasive studies today reveal ankle arm index of 0.9 on the left with triphasic waveforms.  Duplex shows no evidence of stenosis throughout her bypass  MEDICAL ISSUES: Stable overall.  Is continuing to demarcate her left second toe.  I did explain the option of formal amputation of her second toe.  I do feel that this has Rebecca very high chance of an eventual autoamputation but this may take several months.  She is continue to consider this.  I explained that it certainly would be much quicker resolution with amputation of her toe but would lose some additional tissue that may not be lost if she allows it to auto amputate.  She will see us again in 6 months with repeat noninvasive studies.  If she wishes to proceed with amputation of her left second  toe she will call our office   Rosetta Posner, MD [facility] Tel [phone] Pager [phone]

## 2018-12-19 ENCOUNTER — Ambulatory Visit (INDEPENDENT_AMBULATORY_CARE_PROVIDER_SITE_OTHER): Payer: Self-pay | Admitting: Family Medicine

## 2018-12-19 ENCOUNTER — Encounter: Payer: Self-pay | Admitting: Family Medicine

## 2018-12-19 VITALS — BP 170/72 | HR 57 | Temp 98.0°F | Resp 16 | Ht 64.0 in | Wt 161.0 lb

## 2018-12-19 DIAGNOSIS — I739 Peripheral vascular disease, unspecified: Secondary | ICD-10-CM

## 2018-12-19 DIAGNOSIS — E1165 Type 2 diabetes mellitus with hyperglycemia: Secondary | ICD-10-CM

## 2018-12-19 DIAGNOSIS — E118 Type 2 diabetes mellitus with unspecified complications: Secondary | ICD-10-CM

## 2018-12-19 DIAGNOSIS — I96 Gangrene, not elsewhere classified: Secondary | ICD-10-CM

## 2018-12-19 DIAGNOSIS — I10 Essential (primary) hypertension: Secondary | ICD-10-CM

## 2018-12-19 DIAGNOSIS — IMO0002 Reserved for concepts with insufficient information to code with codable children: Secondary | ICD-10-CM

## 2018-12-19 DIAGNOSIS — E782 Mixed hyperlipidemia: Secondary | ICD-10-CM

## 2018-12-19 DIAGNOSIS — I214 Non-ST elevation (NSTEMI) myocardial infarction: Secondary | ICD-10-CM

## 2018-12-19 LAB — POCT URINALYSIS DIPSTICK
Bilirubin, UA: NEGATIVE
Blood, UA: NEGATIVE
Glucose, UA: POSITIVE — AB
Ketones, UA: NEGATIVE
Leukocytes, UA: NEGATIVE
Nitrite, UA: NEGATIVE
Protein, UA: POSITIVE — AB
Spec Grav, UA: 1.025 (ref 1.010–1.025)
Urobilinogen, UA: 0.2 E.U./dL
pH, UA: 5.5 (ref 5.0–8.0)

## 2018-12-19 LAB — POCT GLYCOSYLATED HEMOGLOBIN (HGB A1C): Hemoglobin A1C: 9 % — AB (ref 4.0–5.6)

## 2018-12-19 NOTE — Patient Instructions (Signed)
Diabetes Mellitus and Standards of Medical Care Managing diabetes (diabetes mellitus) can be complicated. Your diabetes treatment may be managed by a team of health care providers, including:  A physician who specializes in diabetes (endocrinologist).  A nurse practitioner or physician assistant.  Nurses.  A diet and nutrition specialist (registered dietitian).  A certified diabetes educator (CDE).  An exercise specialist.  A pharmacist.  An eye doctor.  A foot specialist (podiatrist).  A dentist.  A primary care provider.  A mental health provider. Your health care providers follow guidelines to help you get the best quality of care. The following schedule is a general guideline for your diabetes management plan. Your health care providers may give you more specific instructions. Physical exams Upon being diagnosed with diabetes mellitus, and each year after that, your health care provider will ask about your medical and family history. He or she will also do a physical exam. Your exam may include:  Measuring your height, weight, and body mass index (BMI).  Checking your blood pressure. This will be done at every routine medical visit. Your target blood pressure may vary depending on your medical conditions, your age, and other factors.  Thyroid gland exam.  Skin exam.  Screening for damage to your nerves (peripheral neuropathy). This may include checking the pulse in your legs and feet and checking the level of sensation in your hands and feet.  A complete foot exam to inspect the structure and skin of your feet, including checking for cuts, bruises, redness, blisters, sores, or other problems.  Screening for blood vessel (vascular) problems, which may include checking the pulse in your legs and feet and checking your temperature. Blood tests Depending on your treatment plan and your personal needs, you may have the following tests done:  HbA1c (hemoglobin A1c). This  test provides information about blood sugar (glucose) control over the previous 2-3 months. It is used to adjust your treatment plan, if needed. This test will be done: ? At least 2 times a year, if you are meeting your treatment goals. ? 4 times a year, if you are not meeting your treatment goals or if treatment goals have changed.  Lipid testing, including total, LDL, and HDL cholesterol and triglyceride levels. ? The goal for LDL is less than 100 mg/dL (5.5 mmol/L). If you are at high risk for complications, the goal is less than 70 mg/dL (3.9 mmol/L). ? The goal for HDL is 40 mg/dL (2.2 mmol/L) or higher for men and 50 mg/dL (2.8 mmol/L) or higher for women. An HDL cholesterol of 60 mg/dL (3.3 mmol/L) or higher gives some protection against heart disease. ? The goal for triglycerides is less than 150 mg/dL (8.3 mmol/L).  Liver function tests.  Kidney function tests.  Thyroid function tests. Dental and eye exams  Visit your dentist two times a year.  If you have type 1 diabetes, your health care provider may recommend an eye exam 3-5 years after you are diagnosed, and then once a year after your first exam. ? For children with type 1 diabetes, a health care provider may recommend an eye exam when your child is age 10 or older and has had diabetes for 3-5 years. After the first exam, your child should get an eye exam once a year.  If you have type 2 diabetes, your health care provider may recommend an eye exam as soon as you are diagnosed, and then once a year after your first exam. Immunizations   The   yearly flu (influenza) vaccine is recommended for everyone 6 months or older who has diabetes.  The pneumonia (pneumococcal) vaccine is recommended for everyone 2 years or older who has diabetes. If you are 65 or older, you may get the pneumonia vaccine as a series of two separate shots.  The hepatitis B vaccine is recommended for adults shortly after being diagnosed with diabetes.   Adults and children with diabetes should receive all other vaccines according to age-specific recommendations from the Centers for Disease Control and Prevention (CDC). Mental and emotional health Screening for symptoms of eating disorders, anxiety, and depression is recommended at the time of diagnosis and afterward as needed. If your screening shows that you have symptoms (positive screening result), you may need more evaluation and you may work with a mental health care provider. Treatment plan Your treatment plan will be reviewed at every medical visit. You and your health care provider will discuss:  How you are taking your medicines, including insulin.  Any side effects you are experiencing.  Your blood glucose target goals.  The frequency of your blood glucose monitoring.  Lifestyle habits, such as activity level as well as tobacco, alcohol, and substance use. Diabetes self-management education Your health care provider will assess how well you are monitoring your blood glucose levels and whether you are taking your insulin correctly. He or she may refer you to:  A certified diabetes educator to manage your diabetes throughout your life, starting at diagnosis.  A registered dietitian who can create or review your personal nutrition plan.  An exercise specialist who can discuss your activity level and exercise plan. Summary  Managing diabetes (diabetes mellitus) can be complicated. Your diabetes treatment may be managed by a team of health care providers.  Your health care providers follow guidelines in order to help you get the best quality of care.  Standards of care including having regular physical exams, blood tests, blood pressure monitoring, immunizations, screening tests, and education about how to manage your diabetes.  Your health care providers may also give you more specific instructions based on your individual health. This information is not intended to replace  advice given to you by your health care provider. Make sure you discuss any questions you have with your health care provider. Document Released: 02/13/2009 Document Revised: 01/05/2018 Document Reviewed: 01/15/2016 Elsevier Patient Education  2020 Elsevier Inc.  

## 2018-12-19 NOTE — Progress Notes (Signed)
Patient [facility]   Progress Note: General Provider: Lanae Boast, FNP  SUBJECTIVE:   Rebecca [name] is a 63 y.o. female who  has a past medical history of CAD (coronary artery disease), Diabetes mellitus without complication ([place]), DM (diabetes mellitus), type 2, uncontrolled with complications ([place]) (42/08/9561), Hypertension, Hypertensive emergency (11/14/2012), Hypokalemia (11/14/2012), Hyponatremia (04/08/2012), Mixed hyperlipidemia (04/06/2012), NSTEMI (non-ST elevated myocardial infarction) ([place]) (11/14/2012), and Tobacco abuse (04/06/2012).. Patient presents today for Hypertension and Diabetes  Since her last visit, she has had left femoral and popliteal bypass for critical limb ischemia. 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  She reports compliance with her medications. She is not eating a heart healthy diet or exercising at the present time. She denies any other problems or concerns today.    Review of Systems  Constitutional: Negative.   HENT: Negative.   Eyes: Negative.   Respiratory: Negative.   Cardiovascular: Negative.   Gastrointestinal: Negative.   Genitourinary: Negative.   Musculoskeletal: Negative.   Skin: Negative.   Neurological: Negative.   Psychiatric/Behavioral: Negative.      OBJECTIVE: BP (!) 170/72 Comment: manually  Pulse (!) 57   Temp 98 F (36.7 C) (Oral)   Resp 16   Ht 5\' 4"  (1.626 m)   Wt 161 lb (73 kg)   SpO2 99%   BMI 27.64 kg/m   Wt Readings from Last 3 Encounters:  12/25/18 164 lb (74.4 kg)  12/19/18 161 lb (73 kg)  12/18/18 163 lb (73.9 kg)     Physical Exam Vitals signs and nursing note reviewed.  Constitutional:      General: She is not in acute distress.    Appearance: Normal appearance.  HENT:     Head: Normocephalic and atraumatic.  Eyes:   Extraocular Movements: Extraocular movements intact.     Conjunctiva/sclera: Conjunctivae normal.     Pupils: Pupils are equal, round, and reactive to light.  Cardiovascular:     Rate and Rhythm: Normal rate and regular rhythm.     Heart sounds: No murmur.  Pulmonary:     Effort: Pulmonary effort is normal.     Breath sounds: Normal breath sounds.  Musculoskeletal: Normal range of motion.  Skin:    General: Skin is warm and dry.  Neurological:     Mental Status: She is alert and oriented to person, place, and time.  Psychiatric:        Mood and Affect: Mood normal.        Behavior: Behavior normal.        Thought Content: Thought content normal.        Judgment: Judgment normal.       ASSESSMENT/PLAN:  1. Hypertension, unspecified type - Urinalysis Dipstick - Basic Metabolic Panel 2. DM (diabetes mellitus), type 2, uncontrolled with complications [facility]) Patient is refusing insulin therapy. Discussed controlling with diet and oral medications.  - HgB A1c 9.0 3. Mixed hyperlipidemia - Lipid Panel 4. PAD (peripheral artery disease) ([place]) 5. NSTEMI (non-ST elevated myocardial infarction) ([place]) 6. Dry gangrene ([place]) We discussed the risks and benefit of amputation. Discussed that her diabetes levels are uncontrolled and she may have difficulty with healing after surgery. She is to continue to follow up with her specialist.     Return in about 3 months (around 03/21/2019) for DM.  The patient was given clear instructions to go to ER or return to medical center if symptoms do not improve, worsen or new problems develop. The patient verbalized understanding and agreed with plan of care.   Ms. Doug Sou. Nathaneil Canary, FNP-BC Patient [facility] [address] [place], [place] [address] [phone]

## 2018-12-20 ENCOUNTER — Other Ambulatory Visit: Payer: Self-pay | Admitting: Family Medicine

## 2018-12-20 DIAGNOSIS — E1165 Type 2 diabetes mellitus with hyperglycemia: Secondary | ICD-10-CM

## 2018-12-20 DIAGNOSIS — IMO0002 Reserved for concepts with insufficient information to code with codable children: Secondary | ICD-10-CM

## 2018-12-20 LAB — BASIC METABOLIC PANEL
BUN/Creatinine Ratio: 15 (ref 12–28)
BUN: 14 mg/dL (ref 8–27)
CO2: 27 mmol/L (ref 20–29)
Calcium: 8.8 mg/dL (ref 8.7–10.3)
Chloride: 93 mmol/L — ABNORMAL LOW (ref 96–106)
Creatinine, Ser: 0.92 mg/dL (ref 0.57–1.00)
GFR calc Af Amer: 78 mL/min/{1.73_m2} (ref >59)
GFR calc non Af Amer: 67 mL/min/{1.73_m2} (ref >59)
Glucose: 293 mg/dL — ABNORMAL HIGH (ref 65–99)
Potassium: 3.8 mmol/L (ref 3.5–5.2)
Sodium: 138 mmol/L (ref 134–144)

## 2018-12-20 LAB — LIPID PANEL
Chol/HDL Ratio: 10.7 ratio — ABNORMAL HIGH (ref 0.0–4.4)
Cholesterol, Total: 278 mg/dL — ABNORMAL HIGH (ref 100–199)
HDL: 26 mg/dL — ABNORMAL LOW (ref >39)
Triglycerides: 1306 mg/dL (ref 0–149)

## 2018-12-24 ENCOUNTER — Telehealth: Payer: Self-pay

## 2018-12-24 NOTE — Telephone Encounter (Signed)
-----   Message from Lanae Boast, [place] sent at 12/24/2018  1:27 PM EDT ----- Please check and confirm that patient is taking her cholesterol medications. Her triglycerides are still dangerously elevated.

## 2018-12-24 NOTE — Telephone Encounter (Signed)
Called no answer, left a message for patient to call back. Thanks!

## 2018-12-24 NOTE — Progress Notes (Signed)
HPI: FU hypertensionand CAD. Previously followed by Dr. Einar Gip. Had catheterization July 2014 that showed ejection fraction 60%. There was a 99% left circumflex lesion and a 60 to 70% LAD. Patient had PCI of circumflex with drug-eluting stent.    Patient had left femoral to below-knee popliteal bypass May 2020.  Since last seen,  there is no dyspnea, chest pain, palpitations or syncope.  The pain in her left lower extremity has improved following recent revascularization.  Current Outpatient Medications  Medication Sig Dispense Refill  . aspirin 81 MG chewable tablet Chew 81 mg by mouth daily.    Marland Kitchen atorvastatin (LIPITOR) 80 MG tablet Take 1 tablet (80 mg total) by mouth daily at 6 PM. 90 tablet 3  . gabapentin (NEURONTIN) 300 MG capsule Take 1 capsule (300 mg total) by mouth 3 (three) times daily. Take 1 cap po qhs x 1 week. increase to 1 cap po bid x 1 week. 1 cap PO TID 90 capsule 0  . glyBURIDE (DIABETA) 5 MG tablet TAKE 2 TABLETS BY MOUTH ONCE DAILY WITH BREAKFAST 60 tablet 0  . KRILL OIL 1000 MG CAPS Take 1,000 mg by mouth daily.     Marland Kitchen lisinopril-hydrochlorothiazide (ZESTORETIC) 20-25 MG tablet Take 1 tablet by mouth daily. 30 tablet 5  . metFORMIN (GLUCOPHAGE) 1000 MG tablet Take 1 tablet (1,000 mg total) by mouth 2 (two) times daily with a meal. 180 tablet 3  . metoprolol tartrate (LOPRESSOR) 25 MG tablet Take 1 tablet (25 mg total) by mouth 2 (two) times daily. 60 tablet 5  . amLODipine (NORVASC) 10 MG tablet Take 1 tablet (10 mg total) by mouth daily. 90 tablet 3   No current facility-administered medications for this visit.      Past Medical History:  Diagnosis Date  . CAD (coronary artery disease)   . Diabetes mellitus without complication ([place])   . DM (diabetes mellitus), type 2, uncontrolled with complications ([place]) 94/05/7406  . Hypertension   . Hypertensive emergency 11/14/2012  . Hypokalemia 11/14/2012  . Hyponatremia 04/08/2012  . Mixed hyperlipidemia 04/06/2012   . NSTEMI (non-ST elevated myocardial infarction) ([place]) 11/14/2012  . Tobacco abuse 04/06/2012    Past Surgical History:  Procedure Laterality Date  . FEMORAL-POPLITEAL BYPASS GRAFT Left 09/20/2018   Procedure: Left  FEMORAL-BELOW KNEE POPLITEAL ARTERY Bypass with Translocated nonreversed saphenous vein;  Surgeon: Rosetta Posner, MD;  Location: [facility];  Service: Vascular;  Laterality: Left;  . LEFT HEART CATHETERIZATION WITH CORONARY ANGIOGRAM N/A 11/14/2012   Procedure: LEFT HEART CATHETERIZATION WITH CORONARY ANGIOGRAM;  Surgeon: Laverda Page, MD;  Location: [facility] CATH LAB;  Service: Cardiovascular;  Laterality: N/A;  . LOWER EXTREMITY ANGIOGRAPHY N/A 09/05/2018   Procedure: LOWER EXTREMITY ANGIOGRAPHY;  Surgeon: Serafina Mitchell, MD;  Location: [facility] CV LAB;  Service: Cardiovascular;  Laterality: N/A;  . PERCUTANEOUS CORONARY STENT INTERVENTION (PCI-S)  11/14/2012   Procedure: PERCUTANEOUS CORONARY STENT INTERVENTION (PCI-S);  Surgeon: Laverda Page, MD;  Location: [facility] CATH LAB;  Service: Cardiovascular;;  . TONSILLECTOMY      Social History   Socioeconomic History  . Marital status: Single    Spouse name: Not on file  . Number of children: Not on file  . Years of education: Not on file  . Highest education level: Not on file  Occupational History  . Not on file  Social Needs  . Financial resource strain: Not on file  . Food insecurity    Worry: Not on  file    Inability: Not on file  . Transportation needs    Medical: Not on file    Non-medical: Not on file  Tobacco Use  . Smoking status: Former Smoker    Packs/day: 1.00    Types: Cigarettes  . Smokeless tobacco: Never Used  Substance and Sexual Activity  . Alcohol use: Yes    Comment: occ  . Drug use: No  . Sexual activity: Not on file  Lifestyle  . Physical activity    Days per week: Not on file    Minutes per session: Not on file  . Stress: Not on file  Relationships  . Social Musicianconnections    Talks on phone:  Not on file    Gets together: Not on file    Attends religious service: Not on file    Active member of club or organization: Not on file    Attends meetings of clubs or organizations: Not on file    Relationship status: Not on file  . Intimate partner violence    Fear of current or ex partner: Not on file    Emotionally abused: Not on file    Physically abused: Not on file    Forced sexual activity: Not on file  Other Topics Concern  . Not on file  Social History Narrative  . Not on file    Family History  Problem Relation Age of Onset  . Diabetes Brother   . Heart disease Brother   . Arthritis Brother   . Diabetes Father   . Heart disease Father     ROS: no fevers or chills, productive cough, hemoptysis, dysphasia, odynophagia, melena, hematochezia, dysuria, hematuria, rash, seizure activity, orthopnea, PND, pedal edema, claudication. Remaining systems are negative.  Physical Exam: Well-developed well-nourished in no acute distress.  Skin is warm and dry.  HEENT is normal.  Neck is supple.  Chest is clear to auscultation with normal expansion.  Cardiovascular exam is regular rate and rhythm.  Abdominal exam nontender or distended. No masses palpated. Extremities show trace edema.  Necrotic second digit left lower extremity neuro grossly intact  ECG-sinus rhythm at a rate of 59, left anterior fascicular block, left ventricular hypertrophy, right bundle branch block.  Personally reviewed  A/P  1 coronary artery disease-patient denies chest pain.  Continue medical therapy with aspirin and statin.  2 hypertension-blood pressure is elevated.  Increase lisinopril to 40 mg daily.  Check potassium and renal function in 1 week.  3 hyperlipidemia-continue statin.  Check lipids and liver.  4 peripheral vascular disease-managed by vascular surgery.  5 tobacco abuse-patient has discontinued.  Olga MillersBrian Mahad Newstrom, MD

## 2018-12-25 ENCOUNTER — Encounter: Payer: Self-pay | Admitting: Cardiology

## 2018-12-25 ENCOUNTER — Other Ambulatory Visit: Payer: Self-pay | Admitting: Family Medicine

## 2018-12-25 ENCOUNTER — Ambulatory Visit (INDEPENDENT_AMBULATORY_CARE_PROVIDER_SITE_OTHER): Payer: Self-pay | Admitting: Cardiology

## 2018-12-25 ENCOUNTER — Other Ambulatory Visit: Payer: Self-pay

## 2018-12-25 VITALS — BP 148/77 | HR 59 | Temp 97.3°F | Ht 64.0 in | Wt 164.0 lb

## 2018-12-25 DIAGNOSIS — I10 Essential (primary) hypertension: Secondary | ICD-10-CM

## 2018-12-25 DIAGNOSIS — IMO0002 Reserved for concepts with insufficient information to code with codable children: Secondary | ICD-10-CM

## 2018-12-25 DIAGNOSIS — E1165 Type 2 diabetes mellitus with hyperglycemia: Secondary | ICD-10-CM

## 2018-12-25 DIAGNOSIS — E7800 Pure hypercholesterolemia, unspecified: Secondary | ICD-10-CM

## 2018-12-25 DIAGNOSIS — I739 Peripheral vascular disease, unspecified: Secondary | ICD-10-CM

## 2018-12-25 DIAGNOSIS — I2510 Atherosclerotic heart disease of native coronary artery without angina pectoris: Secondary | ICD-10-CM

## 2018-12-25 MED ORDER — LISINOPRIL 40 MG PO TABS: 40.0000 mg | ORAL_TABLET | Freq: Every day | ORAL | 3 refills | Status: DC

## 2018-12-25 MED ORDER — HYDROCHLOROTHIAZIDE 25 MG PO TABS: 25.0000 mg | ORAL_TABLET | Freq: Every day | ORAL | 3 refills | Status: DC

## 2018-12-25 NOTE — Patient Instructions (Signed)
Medication Instructions:  STOP LISINOPRIL/HCT  START LISINOPRIL 40 MG ONCE DAILY  START HCTZ 25 MG ONCE DAILY  If you need a refill on your cardiac medications before your next appointment, please call your pharmacy.   Lab work: Your physician recommends that you return for lab work in: [place]  If you have labs (blood work) drawn today and your tests are completely normal, you will receive your results only by: Marland Kitchen MyChart Message (if you have MyChart) OR . A paper copy in the mail If you have any lab test that is abnormal or we need to change your treatment, we will call you to review the results.  Follow-Up: At [facility], you and your health needs are our priority.  As part of our continuing mission to provide you with exceptional heart care, we have created designated Provider Care Teams.  These Care Teams include your primary Cardiologist (physician) and Advanced Practice Providers (APPs -  Physician Assistants and Nurse Practitioners) who all work together to provide you with the care you need, when you need it. You will need a follow up appointment in 6 months.  Please call our office 2 months in advance to schedule this appointment.  You may see Kirk Ruths, MD or one of the following Advanced Practice Providers on your designated Care Team:   Kerin Ransom, PA-C Roby Lofts, [place] . Sande Rives, PA-C

## 2018-12-28 ENCOUNTER — Telehealth: Payer: Self-pay | Admitting: *Deleted

## 2018-12-28 DIAGNOSIS — Z006 Encounter for examination for normal comparison and control in clinical research program: Secondary | ICD-10-CM

## 2018-12-28 NOTE — Telephone Encounter (Signed)
Spoke with patient about the Coordinate diabetes trial.  Patient verbally consented to study. Coordinate Diabetes  Informed Consent   Subject Name: Rebecca [name]  Subject met inclusion and exclusion criteria.  The informed consent form, study requirements and expectations were reviewed with the subject and questions and concerns were addressed prior to the signing of the consent form.  The subject verbalized understanding of the trial requirements.  The subject agreed to participate in the Coordinate diabetes trial and signed the informed consent at 1530 on 27Aug2020.  The informed consent was obtained prior to performance of any protocol-specific procedures for the subject.  A copy of the signed informed consent was given to the subject and a copy was placed in the subject's medical record.   [name].   This was a verbal consent and a copy was mailed to the patient.

## 2019-01-07 ENCOUNTER — Other Ambulatory Visit: Payer: Self-pay | Admitting: Family Medicine

## 2019-01-07 DIAGNOSIS — I10 Essential (primary) hypertension: Secondary | ICD-10-CM

## 2019-01-09 ENCOUNTER — Encounter (HOSPITAL_COMMUNITY): Payer: Self-pay

## 2019-01-09 ENCOUNTER — Encounter (HOSPITAL_COMMUNITY): Payer: Self-pay | Admitting: *Deleted

## 2019-01-31 ENCOUNTER — Encounter: Payer: Self-pay | Admitting: *Deleted

## 2019-02-12 ENCOUNTER — Other Ambulatory Visit: Payer: Self-pay | Admitting: Family Medicine

## 2019-02-12 DIAGNOSIS — I10 Essential (primary) hypertension: Secondary | ICD-10-CM

## 2019-02-12 DIAGNOSIS — E1165 Type 2 diabetes mellitus with hyperglycemia: Secondary | ICD-10-CM

## 2019-02-12 DIAGNOSIS — IMO0002 Reserved for concepts with insufficient information to code with codable children: Secondary | ICD-10-CM

## 2019-04-02 ENCOUNTER — Other Ambulatory Visit: Payer: Self-pay | Admitting: Internal Medicine

## 2019-04-02 DIAGNOSIS — I10 Essential (primary) hypertension: Secondary | ICD-10-CM

## 2019-04-08 ENCOUNTER — Other Ambulatory Visit: Payer: Self-pay

## 2019-04-08 DIAGNOSIS — I10 Essential (primary) hypertension: Secondary | ICD-10-CM

## 2019-04-08 MED ORDER — METOPROLOL TARTRATE 25 MG PO TABS: 25.0000 mg | ORAL_TABLET | Freq: Two times a day (BID) | ORAL | 3 refills | Status: DC

## 2019-04-30 ENCOUNTER — Telehealth: Payer: Self-pay | Admitting: *Deleted

## 2019-04-30 NOTE — Telephone Encounter (Signed)
LVM to call [phone] for Coordinate DM trial  3 month phone call.

## 2019-06-06 ENCOUNTER — Telehealth: Payer: Self-pay | Admitting: *Deleted

## 2019-06-06 NOTE — Telephone Encounter (Signed)
A message was left, re: her follow up visit with Dr.Crenshaw. 

## 2019-06-06 NOTE — Telephone Encounter (Signed)
I am no longer this patient's PCP. Please send to Crystal King, NP. Thanks

## 2019-07-03 ENCOUNTER — Telehealth: Payer: Self-pay | Admitting: *Deleted

## 2019-07-03 DIAGNOSIS — Z006 Encounter for examination for normal comparison and control in clinical research program: Secondary | ICD-10-CM

## 2019-07-03 NOTE — Telephone Encounter (Signed)
LVM to call [phone] for Coordinate DM trial 6 month phone call.

## 2019-07-24 ENCOUNTER — Encounter: Payer: Self-pay | Admitting: General Practice

## 2019-09-13 ENCOUNTER — Other Ambulatory Visit: Payer: Self-pay | Admitting: Cardiology

## 2019-09-13 DIAGNOSIS — I10 Essential (primary) hypertension: Secondary | ICD-10-CM

## 2019-09-17 ENCOUNTER — Other Ambulatory Visit: Payer: Self-pay | Admitting: Cardiology

## 2019-09-17 DIAGNOSIS — I10 Essential (primary) hypertension: Secondary | ICD-10-CM

## 2019-10-09 ENCOUNTER — Other Ambulatory Visit: Payer: Self-pay | Admitting: Family Medicine

## 2019-10-09 ENCOUNTER — Other Ambulatory Visit: Payer: Self-pay | Admitting: Internal Medicine

## 2019-10-09 DIAGNOSIS — I10 Essential (primary) hypertension: Secondary | ICD-10-CM

## 2019-10-09 DIAGNOSIS — IMO0002 Reserved for concepts with insufficient information to code with codable children: Secondary | ICD-10-CM

## 2019-10-24 ENCOUNTER — Other Ambulatory Visit: Payer: Self-pay | Admitting: Family Medicine

## 2019-10-24 DIAGNOSIS — IMO0002 Reserved for concepts with insufficient information to code with codable children: Secondary | ICD-10-CM

## 2019-11-15 ENCOUNTER — Encounter: Payer: Self-pay | Admitting: Nurse Practitioner

## 2019-11-15 ENCOUNTER — Ambulatory Visit (INDEPENDENT_AMBULATORY_CARE_PROVIDER_SITE_OTHER): Payer: Self-pay | Admitting: Nurse Practitioner

## 2019-11-15 ENCOUNTER — Other Ambulatory Visit: Payer: Self-pay

## 2019-11-15 VITALS — BP 181/70 | HR 64 | Temp 97.3°F | Ht 64.0 in | Wt 154.0 lb

## 2019-11-15 DIAGNOSIS — E118 Type 2 diabetes mellitus with unspecified complications: Secondary | ICD-10-CM

## 2019-11-15 DIAGNOSIS — Z0000 Encounter for general adult medical examination without abnormal findings: Secondary | ICD-10-CM

## 2019-11-15 DIAGNOSIS — IMO0002 Reserved for concepts with insufficient information to code with codable children: Secondary | ICD-10-CM

## 2019-11-15 DIAGNOSIS — E782 Mixed hyperlipidemia: Secondary | ICD-10-CM

## 2019-11-15 DIAGNOSIS — E1165 Type 2 diabetes mellitus with hyperglycemia: Secondary | ICD-10-CM

## 2019-11-15 DIAGNOSIS — I10 Essential (primary) hypertension: Secondary | ICD-10-CM

## 2019-11-15 LAB — POCT URINALYSIS DIPSTICK
Bilirubin, UA: NEGATIVE
Blood, UA: NEGATIVE
Glucose, UA: NEGATIVE
Ketones, UA: NEGATIVE
Leukocytes, UA: NEGATIVE
Nitrite, UA: NEGATIVE
Protein, UA: NEGATIVE
Spec Grav, UA: 1.015 (ref 1.010–1.025)
Urobilinogen, UA: 0.2 E.U./dL
pH, UA: 5.5 (ref 5.0–8.0)

## 2019-11-15 LAB — POCT GLYCOSYLATED HEMOGLOBIN (HGB A1C)
HbA1c POC (<> result, manual entry): 10.8 % (ref 4.0–5.6)
HbA1c, POC (controlled diabetic range): 10.8 % — AB (ref 0.0–7.0)
HbA1c, POC (prediabetic range): 10.8 % — AB (ref 5.7–6.4)
Hemoglobin A1C: 10.8 % — AB (ref 4.0–5.6)

## 2019-11-15 LAB — GLUCOSE, POCT (MANUAL RESULT ENTRY): POC Glucose: 208 mg/dl — AB (ref 70–99)

## 2019-11-15 MED ORDER — HYDROCHLOROTHIAZIDE 25 MG PO TABS: 25.0000 mg | ORAL_TABLET | Freq: Every day | ORAL | 3 refills | Status: DC

## 2019-11-15 MED ORDER — GLYBURIDE 5 MG PO TABS: ORAL_TABLET | ORAL | 0 refills | Status: DC

## 2019-11-15 MED ORDER — AMLODIPINE BESYLATE 10 MG PO TABS: 10.0000 mg | ORAL_TABLET | Freq: Every day | ORAL | 1 refills | Status: DC

## 2019-11-15 MED ORDER — LISINOPRIL 40 MG PO TABS: 40.0000 mg | ORAL_TABLET | Freq: Every day | ORAL | 3 refills | Status: DC

## 2019-11-15 MED ORDER — METFORMIN HCL 1000 MG PO TABS: 1000.0000 mg | ORAL_TABLET | Freq: Two times a day (BID) | ORAL | 3 refills | Status: DC

## 2019-11-15 MED ORDER — ATORVASTATIN CALCIUM 80 MG PO TABS: 80.0000 mg | ORAL_TABLET | Freq: Every day | ORAL | 3 refills | Status: DC

## 2019-11-15 MED ORDER — METOPROLOL TARTRATE 25 MG PO TABS: 25.0000 mg | ORAL_TABLET | Freq: Two times a day (BID) | ORAL | 3 refills | Status: DC

## 2019-11-15 MED ORDER — INSULIN GLARGINE 100 UNIT/ML ~~LOC~~ SOLN: 10.0000 [IU] | Freq: Every day | SUBCUTANEOUS | 11 refills | Status: DC

## 2019-11-15 NOTE — Patient Instructions (Signed)

## 2019-11-15 NOTE — Progress Notes (Signed)
[facility] Patient [facility] [address] [place], [place]  [address] Phone:  [phone]   Fax:  [phone]   Established Patient Office Visit  Subjective:  Patient ID: Rebecca [name], female    DOB: March 15, 1956  Age: 64 y.o. MRN: [id number]  CC:  Chief Complaint  Patient presents with  . Follow-up    last seen 12/2018; needs medication refill on all medications on file.     HPI Rebecca [name] presents for follow up.  She has been lost to follow-up.  She  has a past medical history of CAD (coronary artery disease), Diabetes mellitus without complication (HCC), DM (diabetes mellitus), type 2, uncontrolled with complications (HCC) (04/06/2012), Hypertension, Hypertensive emergency (11/14/2012), Hypokalemia (11/14/2012), Hyponatremia (04/08/2012), Mixed hyperlipidemia (04/06/2012), NSTEMI (non-ST elevated myocardial infarction) (HCC) (11/14/2012), and Tobacco abuse (04/06/2012).   159/72 Diabetes Mellitus Patient presents for follow up of diabetes. Current symptoms include: hyperglycemia and paresthesia of the feet. Symptoms have progressed to a point and plateaued. Patient denies foot ulcerations, hypoglycemia , nausea and vomiting. Evaluation to date has included: fasting blood sugar, fasting lipid panel and hemoglobin A1C.  Home sugars: BGs are high in the morning, BGs are high in the evening. Current treatment: Continued sulfonylurea which has been not very effective and Continued metformin which has been not very effective.   Hypertension Patient is here for follow-up of elevated blood pressure. She is not exercising and is adherent to a low-salt diet. Blood pressure is not monitored at home. Cardiac symptoms: none. Patient denies chest pain, dyspnea, irregular heart beat, lower extremity edema, palpitations and syncope. Cardiovascular risk factors: diabetes mellitus, dyslipidemia and sedentary lifestyle. Use of agents associated with hypertension: none. History of target organ damage:  angina/ prior myocardial infarction and peripheral artery disease. Past Medical History:  Diagnosis Date  . CAD (coronary artery disease)   . Diabetes mellitus without complication (HCC)   . DM (diabetes mellitus), type 2, uncontrolled with complications (HCC) 04/06/2012  . Hypertension   . Hypertensive emergency 11/14/2012  . Hypokalemia 11/14/2012  . Hyponatremia 04/08/2012  . Mixed hyperlipidemia 04/06/2012  . NSTEMI (non-ST elevated myocardial infarction) (HCC) 11/14/2012  . Tobacco abuse 04/06/2012    Past Surgical History:  Procedure Laterality Date  . FEMORAL-POPLITEAL BYPASS GRAFT Left 09/20/2018   Procedure: Left  FEMORAL-BELOW KNEE POPLITEAL ARTERY Bypass with Translocated nonreversed saphenous vein;  Surgeon: Larina Earthly, MD;  Location: [facility] OR;  Service: Vascular;  Laterality: Left;  . LEFT HEART CATHETERIZATION WITH CORONARY ANGIOGRAM N/A 11/14/2012   Procedure: LEFT HEART CATHETERIZATION WITH CORONARY ANGIOGRAM;  Surgeon: Pamella Pert, MD;  Location: [facility] CATH LAB;  Service: Cardiovascular;  Laterality: N/A;  . LOWER EXTREMITY ANGIOGRAPHY N/A 09/05/2018   Procedure: LOWER EXTREMITY ANGIOGRAPHY;  Surgeon: Nada Libman, MD;  Location: [facility] INVASIVE CV LAB;  Service: Cardiovascular;  Laterality: N/A;  . PERCUTANEOUS CORONARY STENT INTERVENTION (PCI-S)  11/14/2012   Procedure: PERCUTANEOUS CORONARY STENT INTERVENTION (PCI-S);  Surgeon: Pamella Pert, MD;  Location: [facility] CATH LAB;  Service: Cardiovascular;;  . TONSILLECTOMY      Family History  Problem Relation Age of Onset  . Diabetes Brother   . Heart disease Brother   . Arthritis Brother   . Diabetes Father   . Heart disease Father     Social History   Socioeconomic History  . Marital status: Single    Spouse name: Not on file  . Number of children: Not on file  . Years of education:  Not on file  . Highest education level: Not on file  Occupational History  . Not on file  Tobacco Use  . Smoking status: Former  Smoker    Packs/day: 1.00    Types: Cigarettes  . Smokeless tobacco: Never Used  Vaping Use  . Vaping Use: Never used  Substance and Sexual Activity  . Alcohol use: Yes    Comment: occ  . Drug use: No  . Sexual activity: Not on file  Other Topics Concern  . Not on file  Social History Narrative  . Not on file   Social Determinants of Health   Financial Resource Strain:   . Difficulty of Paying Living Expenses:   Food Insecurity:   . Worried About Programme researcher, broadcasting/film/video in the Last Year:   . Barista in the Last Year:   Transportation Needs:   . Freight forwarder (Medical):   Marland Kitchen Lack of Transportation (Non-Medical):   Physical Activity:   . Days of Exercise per Week:   . Minutes of Exercise per Session:   Stress:   . Feeling of Stress :   Social Connections:   . Frequency of Communication with Friends and Family:   . Frequency of Social Gatherings with Friends and Family:   . Attends Religious Services:   . Active Member of Clubs or Organizations:   . Attends Banker Meetings:   Marland Kitchen Marital Status:   Intimate Partner Violence:   . Fear of Current or Ex-Partner:   . Emotionally Abused:   Marland Kitchen Physically Abused:   . Sexually Abused:     Outpatient Medications Prior to Visit  Medication Sig Dispense Refill  . aspirin 81 MG chewable tablet Chew 81 mg by mouth daily.    Marland Kitchen KRILL OIL 1000 MG CAPS Take 1,000 mg by mouth daily.     Marland Kitchen amLODipine (NORVASC) 10 MG tablet Take 1 tablet (10 mg total) by mouth daily. 30 tablet 0  . glyBURIDE (DIABETA) 5 MG tablet TAKE 2 TABLETS BY MOUTH ONCE DAILY WITH BREAKFAST 60 tablet 0  . metFORMIN (GLUCOPHAGE) 1000 MG tablet Take 1 tablet (1,000 mg total) by mouth 2 (two) times daily with a meal. 180 tablet 3  . metoprolol tartrate (LOPRESSOR) 25 MG tablet Take 1 tablet (25 mg total) by mouth 2 (two) times daily. 60 tablet 3  . atorvastatin (LIPITOR) 80 MG tablet Take 1 tablet (80 mg total) by mouth daily at 6 PM. 90 tablet 3   . gabapentin (NEURONTIN) 300 MG capsule Take 1 capsule (300 mg total) by mouth 3 (three) times daily. Take 1 cap po qhs x 1 week. increase to 1 cap po bid x 1 week. 1 cap PO TID (Patient not taking: Reported on 11/15/2019) 90 capsule 0  . hydrochlorothiazide (HYDRODIURIL) 25 MG tablet Take 1 tablet (25 mg total) by mouth daily. 90 tablet 3  . lisinopril (ZESTRIL) 40 MG tablet Take 1 tablet (40 mg total) by mouth daily. 90 tablet 3   No facility-administered medications prior to visit.    Allergies  Allergen Reactions  . Penicillins Rash and Other (See Comments)    Did it involve swelling of the face/tongue/throat, SOB, or low BP? No Did it involve sudden or SEVERE rash/hives, skin peeling, or any reaction on the inside of your mouth or nose? #  #  #  YES  #  #  #  Did you need to seek medical attention at a hospital or doctor's  office? #  #  #  YES  #  #  #  When did it last happen? Unknown    ROS Review of Systems  All other systems reviewed and are negative.     Objective:    Physical Exam Constitutional:      General: She is not in acute distress.    Appearance: She is not ill-appearing or toxic-appearing.  Cardiovascular:     Rate and Rhythm: Normal rate and regular rhythm.     Pulses: Normal pulses.     Heart sounds: Normal heart sounds.  Pulmonary:     Effort: Pulmonary effort is normal.     Breath sounds: Normal breath sounds.  Musculoskeletal:     Cervical back: Normal range of motion.  Skin:    General: Skin is warm.  Neurological:     Mental Status: She is alert.     BP (!) 181/70 (BP Location: Right Arm, Patient Position: Sitting, Cuff Size: Small)   Pulse 64   Temp (!) 97.3 F (36.3 C)   Ht 5\' 4"  (1.626 m)   Wt 154 lb 0.6 oz (69.9 kg)   SpO2 100%   BMI 26.44 kg/m  Wt Readings from Last 3 Encounters:  11/15/19 154 lb 0.6 oz (69.9 kg)  12/25/18 164 lb (74.4 kg)  12/19/18 161 lb (73 kg)     There are no preventive care reminders to display for  this patient.  There are no preventive care reminders to display for this patient.  Lab Results  Component Value Date   TSH 1.190 11/30/2017   Lab Results  Component Value Date   WBC 5.3 11/15/2019   HGB 12.3 11/15/2019   HCT 37.5 11/15/2019   MCV 87 11/15/2019   PLT 198 11/15/2019   Lab Results  Component Value Date   NA 140 11/15/2019   K 3.9 11/15/2019   CO2 27 12/19/2018   GLUCOSE 182 (H) 11/15/2019   BUN 17 11/15/2019   CREATININE 0.95 11/15/2019   BILITOT 0.5 11/15/2019   ALKPHOS 128 (H) 11/15/2019   AST 14 11/15/2019   ALT 19 08/27/2018   PROT 7.1 11/15/2019   ALBUMIN 4.1 11/15/2019   CALCIUM 8.8 11/15/2019   ANIONGAP 14 09/20/2018   Lab Results  Component Value Date   CHOL 354 (H) 11/15/2019   Lab Results  Component Value Date   HDL 24 (L) 11/15/2019   Lab Results  Component Value Date   LDLCALC Comment (A) 11/15/2019   Lab Results  Component Value Date   TRIG 1,349 (HH) 11/15/2019   Lab Results  Component Value Date   CHOLHDL 14.8 (H) 11/15/2019   Lab Results  Component Value Date   HGBA1C 10.8 (A) 11/15/2019   HGBA1C 10.8 11/15/2019   HGBA1C 10.8 (A) 11/15/2019   HGBA1C 10.8 (A) 11/15/2019      Assessment & Plan:   Problem List Items Addressed This Visit      Cardiovascular and Mediastinum   Hypertension - Primary   Relevant Medications   amLODipine (NORVASC) 10 MG tablet   metoprolol tartrate (LOPRESSOR) 25 MG tablet   atorvastatin (LIPITOR) 80 MG tablet   hydrochlorothiazide (HYDRODIURIL) 25 MG tablet   lisinopril (ZESTRIL) 40 MG tablet Refills on medications patient can restart regimen Encourage patient to continue with regular follow-up    Other Relevant Orders   CBC with Differential/Platelet (Completed)   Comp. Metabolic Panel (12) (Completed)   Urinalysis Dipstick (Completed)     Endocrine  DM (diabetes mellitus), type 2, uncontrolled with complications (HCC)   Relevant Medications   glyBURIDE (DIABETA) 5 MG tablet    metFORMIN (GLUCOPHAGE) 1000 MG tablet   atorvastatin (LIPITOR) 80 MG tablet   lisinopril (ZESTRIL) 40 MG tablet   Started insulin glargine (LANTUS) 100 UNIT/ML injection follow-up in 6 weeks encourage lifestyle modifications with diet and exercise   Other Relevant Orders   HgB A1c (Completed)   Glucose (CBG) (Completed)   Microalbumin / creatinine urine ratio (Completed)     Other   Mixed hyperlipidemia   Relevant Medications   amLODipine (NORVASC) 10 MG tablet   metoprolol tartrate (LOPRESSOR) 25 MG tablet   atorvastatin (LIPITOR) 80 MG tablet   hydrochlorothiazide (HYDRODIURIL) 25 MG tablet   lisinopril (ZESTRIL) 40 MG tablet   Other Relevant Orders   Lipid panel (Completed)    Other Visit Diagnoses    Healthcare maintenance       Relevant Orders   Hepatitis C Antibody (Completed)      Meds ordered this encounter  Medications  . glyBURIDE (DIABETA) 5 MG tablet    Sig: TAKE 2 TABLETS BY MOUTH ONCE DAILY WITH BREAKFAST    Dispense:  60 tablet    Refill:  0    Order Specific Question:   Supervising Provider    Answer:   Quentin Angst [id number]  . metFORMIN (GLUCOPHAGE) 1000 MG tablet    Sig: Take 1 tablet (1,000 mg total) by mouth 2 (two) times daily with a meal.    Dispense:  180 tablet    Refill:  3    Order Specific Question:   Supervising Provider    Answer:   Quentin Angst [id number]  . amLODipine (NORVASC) 10 MG tablet    Sig: Take 1 tablet (10 mg total) by mouth daily.    Dispense:  90 tablet    Refill:  1    Order Specific Question:   Supervising Provider    Answer:   Quentin Angst [id number]  . metoprolol tartrate (LOPRESSOR) 25 MG tablet    Sig: Take 1 tablet (25 mg total) by mouth 2 (two) times daily.    Dispense:  60 tablet    Refill:  3    Order Specific Question:   Supervising Provider    Answer:   Quentin Angst [id number]  . atorvastatin (LIPITOR) 80 MG tablet    Sig: Take 1 tablet (80 mg total) by mouth daily at 6 PM.     Dispense:  90 tablet    Refill:  3    Order Specific Question:   Supervising Provider    Answer:   Quentin Angst [id number]  . hydrochlorothiazide (HYDRODIURIL) 25 MG tablet    Sig: Take 1 tablet (25 mg total) by mouth daily.    Dispense:  90 tablet    Refill:  3    Order Specific Question:   Supervising Provider    Answer:   Quentin Angst [id number]  . lisinopril (ZESTRIL) 40 MG tablet    Sig: Take 1 tablet (40 mg total) by mouth daily.    Dispense:  90 tablet    Refill:  3    Order Specific Question:   Supervising Provider    Answer:   Quentin Angst [id number]  . insulin glargine (LANTUS) 100 UNIT/ML injection    Sig: Inject 0.1 mLs (10 Units total) into the skin at bedtime.    Dispense:  10 mL    Refill:  11    Order Specific Question:   Supervising Provider    Answer:   Quentin Angst [[id number]]    Follow-up: Return in about 6 weeks (around 12/27/2019).    Barbette Merino, NP

## 2019-11-16 LAB — CBC WITH DIFFERENTIAL/PLATELET
Basophils Absolute: 0 10*3/uL (ref 0.0–0.2)
Basos: 1 %
EOS (ABSOLUTE): 0.2 10*3/uL (ref 0.0–0.4)
Eos: 4 %
Hematocrit: 37.5 % (ref 34.0–46.6)
Hemoglobin: 12.3 g/dL (ref 11.1–15.9)
Immature Grans (Abs): 0 10*3/uL (ref 0.0–0.1)
Immature Granulocytes: 0 %
Lymphocytes Absolute: 1.5 10*3/uL (ref 0.7–3.1)
Lymphs: 28 %
MCH: 28.4 pg (ref 26.6–33.0)
MCHC: 32.8 g/dL (ref 31.5–35.7)
MCV: 87 fL (ref 79–97)
Monocytes Absolute: 0.3 10*3/uL (ref 0.1–0.9)
Monocytes: 5 %
Neutrophils Absolute: 3.4 10*3/uL (ref 1.4–7.0)
Neutrophils: 62 %
Platelets: 198 10*3/uL (ref 150–450)
RBC: 4.33 x10E6/uL (ref 3.77–5.28)
RDW: 13.4 % (ref 11.7–15.4)
WBC: 5.3 10*3/uL (ref 3.4–10.8)

## 2019-11-16 LAB — MICROALBUMIN / CREATININE URINE RATIO
Creatinine, Urine: 38.5 mg/dL
Microalb/Creat Ratio: 160 mg/g creat — ABNORMAL HIGH (ref 0–29)
Microalbumin, Urine: 61.7 ug/mL

## 2019-11-16 LAB — LIPID PANEL
Chol/HDL Ratio: 14.8 ratio — ABNORMAL HIGH (ref 0.0–4.4)
Cholesterol, Total: 354 mg/dL — ABNORMAL HIGH (ref 100–199)
HDL: 24 mg/dL — ABNORMAL LOW (ref >39)
Triglycerides: 1349 mg/dL (ref 0–149)

## 2019-11-16 LAB — COMP. METABOLIC PANEL (12)
AST: 14 IU/L (ref 0–40)
Albumin/Globulin Ratio: 1.4 (ref 1.2–2.2)
Albumin: 4.1 g/dL (ref 3.8–4.8)
Alkaline Phosphatase: 128 IU/L — ABNORMAL HIGH (ref 48–121)
BUN/Creatinine Ratio: 18 (ref 12–28)
BUN: 17 mg/dL (ref 8–27)
Bilirubin Total: 0.5 mg/dL (ref 0.0–1.2)
Calcium: 8.8 mg/dL (ref 8.7–10.3)
Chloride: 101 mmol/L (ref 96–106)
Creatinine, Ser: 0.95 mg/dL (ref 0.57–1.00)
GFR calc Af Amer: 74 mL/min/{1.73_m2} (ref >59)
GFR calc non Af Amer: 64 mL/min/{1.73_m2} (ref >59)
Globulin, Total: 3 g/dL (ref 1.5–4.5)
Glucose: 182 mg/dL — ABNORMAL HIGH (ref 65–99)
Potassium: 3.9 mmol/L (ref 3.5–5.2)
Sodium: 140 mmol/L (ref 134–144)
Total Protein: 7.1 g/dL (ref 6.0–8.5)

## 2019-11-16 LAB — HEPATITIS C ANTIBODY: Hep C Virus Ab: <0.1 s/co ratio (ref 0.0–0.9)

## 2019-11-22 ENCOUNTER — Encounter: Payer: Self-pay | Admitting: Nurse Practitioner

## 2019-11-22 DIAGNOSIS — E1129 Type 2 diabetes mellitus with other diabetic kidney complication: Secondary | ICD-10-CM | POA: Insufficient documentation

## 2019-11-22 DIAGNOSIS — IMO0002 Reserved for concepts with insufficient information to code with codable children: Secondary | ICD-10-CM | POA: Insufficient documentation

## 2019-12-02 ENCOUNTER — Emergency Department (HOSPITAL_COMMUNITY)
Admission: EM | Admit: 2019-12-02 | Discharge: 2019-12-02 | Disposition: A | Discharge status: Home / Self Care | Payer: PRIVATE HEALTH INSURANCE | Attending: Emergency Medicine | Admitting: Emergency Medicine

## 2019-12-02 DIAGNOSIS — Z7982 Long term (current) use of aspirin: Secondary | ICD-10-CM | POA: Diagnosis not present

## 2019-12-02 DIAGNOSIS — W268XXA Contact with other sharp object(s), not elsewhere classified, initial encounter: Secondary | ICD-10-CM | POA: Diagnosis not present

## 2019-12-02 DIAGNOSIS — Y999 Unspecified external cause status: Secondary | ICD-10-CM | POA: Insufficient documentation

## 2019-12-02 DIAGNOSIS — Y929 Unspecified place or not applicable: Secondary | ICD-10-CM | POA: Diagnosis not present

## 2019-12-02 DIAGNOSIS — E119 Type 2 diabetes mellitus without complications: Secondary | ICD-10-CM | POA: Insufficient documentation

## 2019-12-02 DIAGNOSIS — Z79899 Other long term (current) drug therapy: Secondary | ICD-10-CM | POA: Insufficient documentation

## 2019-12-02 DIAGNOSIS — Z87891 Personal history of nicotine dependence: Secondary | ICD-10-CM | POA: Diagnosis not present

## 2019-12-02 DIAGNOSIS — Z23 Encounter for immunization: Secondary | ICD-10-CM | POA: Insufficient documentation

## 2019-12-02 DIAGNOSIS — I2510 Atherosclerotic heart disease of native coronary artery without angina pectoris: Secondary | ICD-10-CM | POA: Diagnosis not present

## 2019-12-02 DIAGNOSIS — I10 Essential (primary) hypertension: Secondary | ICD-10-CM | POA: Diagnosis not present

## 2019-12-02 DIAGNOSIS — Y939 Activity, unspecified: Secondary | ICD-10-CM | POA: Insufficient documentation

## 2019-12-02 DIAGNOSIS — S71112A Laceration without foreign body, left thigh, initial encounter: Secondary | ICD-10-CM | POA: Diagnosis not present

## 2019-12-02 MED ORDER — LIDOCAINE-EPINEPHRINE 1 %-1:100000 IJ SOLN
10.0000 mL | Freq: Once | INTRAMUSCULAR | Status: AC
  Administered 2019-12-02: 10 mL
  Filled 2019-12-02: qty 1

## 2019-12-02 MED ORDER — TETANUS-DIPHTH-ACELL PERTUSSIS 5-2.5-18.5 LF-MCG/0.5 IM SUSP
0.5000 mL | Freq: Once | INTRAMUSCULAR | Status: AC
  Administered 2019-12-02: 0.5 mL via INTRAMUSCULAR
  Filled 2019-12-02: qty 0.5

## 2019-12-02 MED ORDER — DOXYCYCLINE HYCLATE 100 MG PO CAPS
100.0000 mg | ORAL_CAPSULE | Freq: Two times a day (BID) | ORAL | 0 refills | Status: AC
Start: 2019-12-02 — End: 2019-12-07

## 2019-12-02 NOTE — ED Provider Notes (Signed)
[facility] EMERGENCY DEPARTMENT Provider Note   CSN: [id number] Arrival date & time: 12/02/19  1211     History Chief Complaint  Patient presents with  . Laceration    Rebecca [name] is a 64 y.o. female presenting for evaluation of leg laceration.  Patient states just prior to arrival she was using a box cutter when she slipped, and cut her left thigh. She has been able to ambulate since. She has mild pain at the cut, no pain elsewhere. She denies numbness or weakness. She is on ASA, unsure if she is on any other blood thinner. Last tdap was >5 yrs ago (but maybe less than 10). She is a poorly controlled diabetic. No injuries elsewhere. She has not cleaned it or done anything else for wound care.     HPI     Past Medical History:  Diagnosis Date  . CAD (coronary artery disease)   . Diabetes mellitus without complication (HCC)   . DM (diabetes mellitus), type 2, uncontrolled with complications (HCC) 04/06/2012  . Hypertension   . Hypertensive emergency 11/14/2012  . Hypokalemia 11/14/2012  . Hyponatremia 04/08/2012  . Mixed hyperlipidemia 04/06/2012  . NSTEMI (non-ST elevated myocardial infarction) (HCC) 11/14/2012  . Tobacco abuse 04/06/2012    Patient Active Problem List   Diagnosis Date Noted  . 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 PAD (peripheral artery disease) (HCC) 09/05/2018  . Hypertensive emergency 11/14/2012  . Hypokalemia 11/14/2012  . NSTEMI (non-ST elevated myocardial infarction) (HCC) 11/14/2012  . Hyponatremia 04/08/2012  . Hypertension 04/06/2012  . Chest pain 04/06/2012  . DM (diabetes mellitus), type 2, uncontrolled with complications (HCC) 04/06/2012  . Tobacco abuse 04/06/2012  . Mixed hyperlipidemia 04/06/2012    Past Surgical History:  Procedure Laterality Date  . FEMORAL-POPLITEAL BYPASS GRAFT Left 09/20/2018   Procedure: Left  FEMORAL-BELOW KNEE POPLITEAL ARTERY Bypass with  Translocated nonreversed saphenous vein;  Surgeon: Larina Earthly, MD;  Location: [facility] OR;  Service: Vascular;  Laterality: Left;  . LEFT HEART CATHETERIZATION WITH CORONARY ANGIOGRAM N/A 11/14/2012   Procedure: LEFT HEART CATHETERIZATION WITH CORONARY ANGIOGRAM;  Surgeon: Pamella Pert, MD;  Location: [facility] CATH LAB;  Service: Cardiovascular;  Laterality: N/A;  . LOWER EXTREMITY ANGIOGRAPHY N/A 09/05/2018   Procedure: LOWER EXTREMITY ANGIOGRAPHY;  Surgeon: Nada Libman, MD;  Location: [facility] INVASIVE CV LAB;  Service: Cardiovascular;  Laterality: N/A;  . PERCUTANEOUS CORONARY STENT INTERVENTION (PCI-S)  11/14/2012   Procedure: PERCUTANEOUS CORONARY STENT INTERVENTION (PCI-S);  Surgeon: Pamella Pert, MD;  Location: [facility] CATH LAB;  Service: Cardiovascular;;  . TONSILLECTOMY       OB History   No obstetric history on file.     Family History  Problem Relation Age of Onset  . Diabetes Brother   . Heart disease Brother   . Arthritis Brother   . Diabetes Father   . Heart disease Father     Social History   Tobacco Use  . Smoking status: Former Smoker    Packs/day: 1.00    Types: Cigarettes  . Smokeless tobacco: Never Used  Vaping Use  . Vaping Use: Never used  Substance Use Topics  . Alcohol use: Yes    Comment: occ  . Drug use: No    Home Medications Prior to Admission medications   Medication Sig Start Date End Date Taking? Authorizing Provider  amLODipine (NORVASC) 10 MG tablet Take 1 tablet (10 mg total) by mouth daily. 11/15/19 05/13/20  Barbette Merino, NP  aspirin 81 MG chewable tablet Chew 81 mg by mouth daily.    [provider]  atorvastatin (LIPITOR) 80 MG tablet Take 1 tablet (80 mg total) by mouth daily at 6 PM. 11/15/19 11/09/20  Barbette Merino, NP  doxycycline (VIBRAMYCIN) 100 MG capsule Take 1 capsule (100 mg total) by mouth 2 (two) times daily for 5 days. 12/02/19 12/07/19  Briggs Edelen, PA-C  glyBURIDE (DIABETA) 5 MG tablet TAKE 2 TABLETS BY MOUTH ONCE  DAILY WITH BREAKFAST 11/15/19   Barbette Merino, NP  hydrochlorothiazide (HYDRODIURIL) 25 MG tablet Take 1 tablet (25 mg total) by mouth daily. 11/15/19 02/13/20  Barbette Merino, NP  insulin glargine (LANTUS) 100 UNIT/ML injection Inject 0.1 mLs (10 Units total) into the skin at bedtime. 11/15/19   Barbette Merino, NP  KRILL OIL 1000 MG CAPS Take 1,000 mg by mouth daily.     [provider]  lisinopril (ZESTRIL) 40 MG tablet Take 1 tablet (40 mg total) by mouth daily. 11/15/19 02/13/20  Barbette Merino, NP  metFORMIN (GLUCOPHAGE) 1000 MG tablet Take 1 tablet (1,000 mg total) by mouth 2 (two) times daily with a meal. 11/15/19   Barbette Merino, NP  metoprolol tartrate (LOPRESSOR) 25 MG tablet Take 1 tablet (25 mg total) by mouth 2 (two) times daily. 11/15/19   Barbette Merino, NP    Allergies    Penicillins  Review of Systems   Review of Systems  Skin: Positive for wound.  Hematological: Does not bruise/bleed easily.    Physical Exam Updated Vital Signs BP 135/64 (BP Location: Left Arm)   Pulse 68   Temp 98.5 F (36.9 C) (Oral)   Resp 20   SpO2 99%   Physical Exam Vitals and nursing note reviewed.  Constitutional:      General: She is not in acute distress.    Appearance: She is well-developed.  HENT:     Head: Normocephalic and atraumatic.  Pulmonary:     Effort: Pulmonary effort is normal.  Abdominal:     General: There is no distension.  Musculoskeletal:        General: Normal range of motion.     Cervical back: Normal range of motion.     Comments: No difficulty with straight leg raise. Able to flex and extend at knee against resistance without weakness. Pedal pulse 2+. Good distal sensation and cap refill  Skin:    General: Skin is warm.     Capillary Refill: Capillary refill takes less than 2 seconds.     Findings: No rash.     Comments: 2 cm lac, ~4 mm deep, of the mid L thigh. No active bleeding.   Neurological:     Mental Status: She is alert and oriented  to person, place, and time.     ED Results / Procedures / Treatments   Labs (all labs ordered are listed, but only abnormal results are displayed) Labs Reviewed - No data to display  EKG None  Radiology No results found.  Procedures .Marland KitchenLaceration Repair  Date/Time: 12/02/2019 1:25 PM Performed by: Alveria Apley, PA-C Authorized by: Alveria Apley, PA-C   Consent:    Consent obtained:  Verbal   Consent given by:  Patient   Risks discussed:  Infection, need for additional repair, nerve damage, poor wound healing, poor cosmetic result and pain Anesthesia (see MAR for exact dosages):    Anesthesia method:  Local infiltration   Local anesthetic:  Lidocaine 2% WITH epi Laceration details:    Location:  Leg   Leg location:  L upper leg   Length (cm):  2   Depth (mm):  4 Repair type:    Repair type:  Simple Pre-procedure details:    Preparation:  Patient was prepped and draped in usual sterile fashion and imaging obtained to evaluate for foreign bodies Exploration:    Hemostasis achieved with:  Direct pressure   Wound exploration: wound explored through full range of motion and entire depth of wound probed and visualized     Wound extent: no nerve damage noted, no tendon damage noted and no vascular damage noted   Treatment:    Area cleansed with:  Saline and Hibiclens   Amount of cleaning:  Standard   Irrigation solution:  Sterile water   Irrigation method:  Syringe Skin repair:    Repair method:  Sutures   Suture size:  4-0   Suture material:  Prolene   Suture technique:  Simple interrupted   Number of sutures:  4 Approximation:    Approximation:  Close Post-procedure details:    Dressing:  Sterile dressing   Patient tolerance of procedure:  Tolerated well, no immediate complications   (including critical care time)  Medications Ordered in ED Medications  lidocaine-EPINEPHrine (XYLOCAINE W/EPI) 1 %-1:100000 (with pres) injection 10 mL (10 mLs Infiltration  Given by Other 12/02/19 1250)  Tdap (BOOSTRIX) injection 0.5 mL (0.5 mLs Intramuscular Given 12/02/19 1249)    ED Course  I have reviewed the triage vital signs and the nursing notes.  Pertinent labs & imaging results that were available during my care of the patient were reviewed by me and considered in my medical decision making (see chart for details).    MDM Rules/Calculators/A&P                          Pt presenting for evaluation of L thigh lac. On exam, pt is neuro intact.  No difficulty with straight leg raise.  No numbness.  She has been able to ambulate with out difficulty.  Doubt tendon or muscle damage.  I do not believe she needs x-ray, as laceration is rather shallow, no obvious foreign body and really no bony injury.  Laceration repaired as described above.  Tetanus updated.  As patient is a poorly controlled diabetic, will place on antibiotics, she has a hives reaction to penicillins with Doxy was given.  Discussed aftercare instructions.  Discussed monitoring for signs of infection.  At this time, patient appears safe for discharge.  Return precautions given.  Patient states she understands and agrees to plan.  Final Clinical Impression(s) / ED Diagnoses Final diagnoses:  Laceration of left thigh, initial encounter    Rx / DC Orders ED Discharge Orders         Ordered    doxycycline (VIBRAMYCIN) 100 MG capsule  2 times daily     Discontinue  Reprint     12/02/19 1318           [place], Macala Baldonado, PA-C 12/02/19 1421    Sabas Sous, MD 12/02/19 1505

## 2019-12-02 NOTE — ED Triage Notes (Signed)
Pt bib ems from work after getting cut by boxcutter. Pt is on blood thinners. Bleeding controlled on arrival.  138 palpated RR 24 HR 88

## 2019-12-02 NOTE — Discharge Instructions (Signed)
1. Medications: Tylenol or ibuprofen for pain, usual home medications 2. Treatment: ice for swelling, keep wound clean with warm soap and water and keep bandage dry 3. Follow Up: Please return in 10 days to have your stitches removed or sooner if you have concerns. Return to the emergency department for increased redness, drainage of pus from the wound   WOUND CARE  Remove bandage and wash wound gently with mild soap and warm water. Reapply a new bandage after cleaning wound.   Continue daily cleansing with soap and water until stitches are removed.  Do not apply any ointments or creams to the wound while stitches are in place, as this may cause delayed healing. Return if you experience any of the following signs of infection: Swelling, redness, pus drainage, streaking, fever >101.0 F  Return if you experience excessive bleeding that does not stop after 15-20 minutes of constant, firm pressure.

## 2019-12-02 NOTE — ED Notes (Signed)
Patient Alert and oriented to baseline. Stable and ambulatory to baseline. Patient verbalized understanding of the discharge instructions.  Patient belongings were taken by the patient.   

## 2019-12-03 ENCOUNTER — Encounter: Payer: Self-pay | Admitting: *Deleted

## 2019-12-03 DIAGNOSIS — Z006 Encounter for examination for normal comparison and control in clinical research program: Secondary | ICD-10-CM

## 2019-12-03 NOTE — Research (Signed)
COORDINATE-Diabetes 12 Month CASE REPORT FORM (Intervention) -EHR Site #:   [id number]              Patient ID:    [id number]   38 [facility] EHR REVIEW  Medical Record Check Date 12/02/2019   Vital Status '[x]' Patient Alive >Date last known alive per EHR:   '[]' Patient Dead >> Complete Death Form  '[]' Unknown   CLINICAL EVENTS / PROCEDURES  Hospitalization since last visit? (>=24 hour stay) '[x]' No '[]' Yes  >> if yes, Complete the following  Date of hospital admission:        / /             MM DD YYYY  Primary discharge diagnosis:  *Complete appropriate event validation form '[]' acute myocardial infarction (heart attack)* '[]' stroke* '[]' heart failure* '[]' coronary revascularization* '[]' peripheral revascularization* '[]' cerebral revascularization* '[]' diabetes (e.g. hypoglycemia, DKA) '[]' renal failure '[]' amputation '[]' other cardiovascular reason '[]' other NON-cardiovascular reason '[]' unknown  Other diagnoses not documented above: (check all that apply)  *Complete appropriate event validation form '[]' acute myocardial infarction (heart attack)* '[]' stroke* '[]' heart failure* '[]' coronary revascularization* '[]' peripheral revascularization* '[]' cerebral revascularization* '[]' diabetes (e.g. hypoglycemia, DKA) '[]' renal failure '[]' amputation '[]' other cardiovascular reason '[]' other NON-cardiovascular reason '[]' unknown  Were any of the following outpatient procedures done since the last visit? (I.e. procedures not captured above)  Coronary revascularization '[x]' No '[]' Yes >IF YES, Date / /             MM DD YYYY  Peripheral revascularization '[x]' No '[]' Yes > IF YES, Date / /             MM DD YYYY  Cerebral revascularization '[x]' No '[]' Yes > IF YES, Date / /             MM DD YYYY  Extremity amputation    '[x]' No '[]' Yes >IF YES, Date / /             MM      DD       YYYY  Renal replacement therapy (i.e. dialysis)    '[x]' No '[]' Yes > IF YES, Date of initiation / /             MM      DD       YYYY   **EDC will allow for collection of  multiple hospitalizations and procedures   MEDICATIONS  Medication Currently Prescribed? If started since last visit: If not started since last visit: If stopped since last visit:  Cardiac Medications  ACE Inhibitor / Angiotensin Receptor Blocker (ARB) / Angiotensin Receptor Neprilysin inhibitor (ARNi)  '[]' No >  '[x]' Yes > Since last visit, medication was: '[]' Stopped '[]' Not started '[]' Started '[x]' Continued same medication '[]' Continued with medication changes Date started:        / /             MM DD YYYY Who prescribed? '[]' Cardiology provider  '[]' Study clinic '[]' Outside clinic '[]' Endocrinology provider '[]' Primary care provider '[]' Other provider  Specify:                             '[]' Unknown Reason (check all that apply): '[]' History of swelling around lips, eyes or face '[]' Feeling dizzy/lightheaded '[]' Low blood pressure '[]' Poor or fluctuating kidney function '[]' High potassium '[]' Patient has experienced other side effects to this medication  before '[]' Patient will be unable to adhere/monitor '[]' Patient unable to afford it '[]' Patient does not want to      take this medication '[]' Pregnancy '[]' Other (specify: ) '[]' Unknown Reason Date discontinued:        / /  MM DD YYYY Reason (check all that apply): '[]' Swelling around lips, eyes       or face '[]' Feeling dizzy/lightheaded '[]' Low blood pressure '[]' Poor or fluctuating kidney function '[]' High potassium '[]' Other medication side       effects '[]' Patient unable to        adhere/monitor '[]' Had an       operation/procedure that        required stopping it '[]' Patient unable to afford it '[]' Patient no longer wants       to take this medication '[]' Pregnancy '[]' Other (specify: ) '[]' Unknown Reason   If started or changed  Medication Name: '[]' Benazepril (Lotensin) '[]' Captopril (Capoten) '[]' Enalapril (Vasotec) '[]' Fosinopril (Monopril) '[]' Lisinopril (Zestril, Prinivil) '[]' Quinapril (Accupril) '[]' Ramipril (Altace) '[]' Azilsartan  (Edarbi) '[]' Candesartan (Atacand) '[]' Irbesartan (Avapro) '[]' Losartan (Cozaar) '[]' Olmesartan (Benicar) '[]' Telmisartan (Micardis) '[]' Valsartan (Diovan) '[]' Sacrubitril/Valsartan (Entresto)     Beta Blocker '[]' No '[x]'  Yes > '[]' Acebutolol (Sectral) '[]' Bisoprolol (Zebeta) '[]' Carvedilol (Coreg) '[]' Labetalol (Trandate,      Normodyne) '[]' Metoprolol succinate (Toprol) '[x]' Metoprolol tartrate       (Lopressor) '[]' Nadolol (Corgard) '[]' Nebivolol (Bystolic) '[]' Propranolol (Inderal) '[]' Sotalol (Betapace)     Medication Currently Prescribed? If started since last visit: If not started since last visit: If stopped since last visit:  Aldosterone Antagonist '[x]' No '[]' Yes > '[]' Amiloride '[]' Eplerenone (Inspra) '[]' Spirinolactone (Aldactone) '[]' Traimterene (Dyrenium)   Calcium Channel Blocker '[]' No '[x]'  Yes > Medication Name: '[x]' Amlodipine (Norvasc) '[]' Diltiazem (Cardizem) '[]' Felodipine (Plendil) '[]' Nifedipine (Procardia) '[]' Verapamil (Calan)   Diuretic Loop '[x]' No '[]'  Yes > Medication Name: '[]' Bumetanide (Bumex) '[]' Ethacrynic acid (Edecrin) '[]' Furosemide (Lasix) '[]' Torsemide (Demadex)   Diuretic Thiazide- type '[]' No '[x]'  Yes > Medication Name: '[]' Chlorothiazide      '[]' Chlorthalidone '[x]' Hydrochlorothiazide '[]' Indapamide '[]' Metolazone   Anticoagulation Therapy (other than Warfarin) '[x]' No '[]'  Yes > Medication Name: '[]' Apixaban (Eliquis) '[]' Edoxaban (Lixiana) '[]' Rivaroxaban (Xeralto) '[]' Dabigatran (Redaxa)   Warfarin '[x]' No '[]'  Yes    Antiplatelet Agent (including aspirin) '[]' No '[x]'  Yes > Medication Name (check all that apply): '[x]' Aspirin '[]' Clopidogrel (Plavix) '[]' Prasugrel (Effient) '[]' Ticagrelor (Brilinta) '[]' Ticlopidine (Ticlid) '[]' Dipyridamole (Persantine)    Medication Currently Prescribed? If started since last visit: If not started since last visit: If stopped since last visit:  Statin  '[]' No >  '[x]' Yes > Since last visit, medication was: '[]' Stopped '[]' Not started '[]' Started '[x]' Continued same  medication and dose '[]' Continued with dose or medication changes Date started:        / /             MM DD YYYY Who prescribed? '[]' Cardiology provider '[]' Endocrinology provider '[]' Primary care provider '[]' Other provider  Specify:                             '[]' Unknown Reason (check all that apply): '[]' History of Rhabdomyolysis '[]' LDL-cholesterol already       <70 '[]' Muscle      aches/pain/weakness '[]' Mental       fogginess/memory loss '[]' Liver dysfunction '[]' Patient has  experienced other side   effects to this medication before '[]' Patient will be unable to adhere/monitor '[]' Patient unable to afford it '[]' Patient does not want to take this medication '[]' Pregnancy '[]' Other (specify: ) '[]' Unknown Reason Date discontinued:        / /             MM DD YYYY Reason (check all that apply): '[]' Rhabdomyolysis '[]' Muscle aches/pain/weakness '[]' Mental fogginess/memory loss '[]' Liver dysfunction '[]' Other medication side effects '[]' Patient unable to          adhere/monitor '[]' Patient unable to afford it '[]' Patient no longer wants to take  this medication '[]' Pregnancy '[]' Other (specify: ) '[]' Unknown Reason   If started or changed  Medication Name: '[]' Atorvastatin (Lipitor) '[]' Fluvastatin (Lescol) '[]' Lovastatin (Mevacor) '[]' Pravastatin (Pravachol) '[]' Rosuvastatin (Crestor) '[]' Simvastatin (Zocor) '[]' Pitatavastatin (Livalo)  Dose: '[]' 1 mg '[]' 10 mg '[]' 2 mg '[]'  20 mg '[]' 3 mg '[]'  40 mg '[]' 4 mg '[]'  60 mg '[]' 5 mg '[]'  80 mg  Frequency: '[]' Daily  '[]' Less than daily      Does the patient have statin intolerance that prevents the use of maximum dose of high potency statin? '[]' No '[]' Yes >IF YES, Complete Statin Intolerance form   Non-statin lipid lowering therapy '[x]' No '[]' Yes > Medication Name (check all that apply): '[]' Colesevelam (Welchol) '[]' Ezetimibe (Zetia) '[]' Fibrate '[]' Niacin '[]' PCSK9 inhibitor '[]' Omega 3 acid ethyl esters (Lovaza) '[]' Icosapent Ethyl (Vascepa) '[]' Over the counter omega 3 fatty acid or fish oil  supplement     Medication Currently Prescribed? If started since last visit: If not started since last visit: If stopped since last visit:  Diabetes Medications  SGLT2 Inhibitor  '[x]' No >  '[]' Yes > Since last visit, medication was: '[]' Stopped '[]' Not started '[]' Started '[]' Continued same medication '[]' Continued with medication changes Date started:        / /             MM DD YYYY Who prescribed? '[]' Cardiology provider  '[]' Study clinic '[]' Outside clinic '[]' Endocrinology      provider '[]' Primary care provider '[]' Other provider  Specify:                             '[]' Unknown Reason (check all that apply): '[]' eGFR <45 '[]' HbA1c<7% on metformin monotherapy OR already on GLP1RA and do not need to start another anti- hyperglycemic '[]' Already dehydrated '[]' Low blood pressure '[]' High risk of Hypoglycemia '[]' Prior DKA '[]' Recurrent mycotic genital infections '[]' History of or at risk for amputation '[]' Patient has experienced other side effects to this medication before '[]' Patient will be unable to adhere/monitor '[]' Patient unable to afford it '[]' Patient does not want to take this medication '[]' Pregnancy '[]' Other (specify: ) '[]' Unknown Reason Date discontinued:        / /             MM DD YYYY Reason (check all that apply  '[]' eGFR now <45 '[]' Dehydration '[]' Low blood pressure '[]' Hypoglycemia '[]' DKA '[]' Mycotic genital infection '[]' Amputation '[]' Other medication side effects '[]' Patient unable    to adhere/monitor  '[]' Had an operation/procedure     that required stopping it '[]' Patient unable to afford it '[]' Patient no longer wants to      take this medication '[]' Pregnancy '[]' Other (specify: ) '[x]' Unknown Reason   If started or changed  Medication Name: '[]' Canaglifozin (Invokana) '[]' Dapagliflozin Wilder Glade) '[]' Empaglifozin (Jardiance) '[]' Ertugliflozin Actuary)           Medication Currently Prescribed? If started since last visit: If not started since last visit: If stopped since last visit:  GLP1  Receptor Agonist  '[x]' No >  '[]' Yes > Since last visit, medication was: '[]' Stopped '[]' Not started '[]' Started '[]' Continued same medication '[]' Continued with medication changes Date started:        / /             MM DD YYYY Who prescribed? '[]' Cardiology provider  '[]' Study clinic '[]' Outside clinic '[]' Endocrinology       provider '[]' Primary care provider '[]' Other provider > Specify:                             '[]' Unknown Reason (check all that apply): '[]' Personal or family  history of medullary thyroid cancer '[]' MEN2 '[]' HbA1c<7% on metformin monotherapy OR already on SGLT2i and do not need to start another anti-hyperglycemic '[]' eGFR now <30 '[]' High risk of Hypoglycemia '[]' History of pancreatitis '[]' Significant gastroparesis '[]' Prior gastric surgery '[]' Patient has experienced other side effects to this medication before '[]' Patient will be unable to adhere/monitor '[]' Patient unable to afford it '[]' Patient does not want to take this medication '[]' Pregnancy '[]' Other (specify: ) '[]' Unknown Reason Date discontinued:        / /             MM DD YYYY Reason (check all that apply): '[]' Medullary thyroid cancer '[]' MEN2 '[]' eGFR now <30 '[]' Hypoglycemia '[]' Pancreatitis '[]' Significant gastroparesis '[]' Gastric surgery '[]' Other medication side       effects '[]'   Patient  unable    to adhere/monitor                                  '[]' Had an operation/procedure that required stopping it '[]' Patient unable to afford it '[]' Patient no longer wants to take this medication '[]' Pregnancy '[]' Other (specify: ) '[x]' Unknown Reason   If started or changed > Medication Name: '[]' Albiglutide (Tanzeum) '[]' Dulaglutide (Trulicity) '[]' Exanatide (Byetta, Bydureon) '[]' Liraglutide (Victoza, Saxenda) '[]' Lixisenatide (Adlyxin) '[]' Semaglutice (Ozempic)      Medication Currently Prescribed? If started since last visit: If not started since last visit: If stopped since last visit:  Other non Insulin diabetes medications '[]' No '[x]' Yes >  Medication Name (check all that apply): '[]' Acarbose (Precose) '[]' Miglitol (Glyset) '[]' Glimepiride (Amaryl) '[]' Glipizide (Amaryl) '[x]' Glyburide (Diabeta,       Glynase,   Micronase) '[x]' Metformin (Fortamet,        Glucophage[including XR],        Glumetza, Riomet) '[]' Pioglitazone (Actos) '[]' Nateglinide (Starlix) '[]' Pramlintide (Symilin) '[]' Repaglinide (Prandin) '[]' Rosiglitazone (Avandia) '[]' Alogliptin (Nesina) '[]' Linagliptin (Tradjenta) '[]' Saxagliptin (Onglyza) '[]' Sitagliptin (Januvia) '[]' Bromocriptine Quick Release (Cycloset)     Insulin '[]' No '[x]'  Yes > total daily dose: 10 units     STATIN INTOLERANCE (PER EHR/OTHER SOURCE DATA)  1. Was CK checked? '[x]' No '[]' Yes   >If yes, select from the following: '[]' CK not elevated '[]' CK elevated 1-5x upper limit of normal '[]' CK elevated >5x upper limit of normal  2. Does the patient have muscle symptoms? '[]' No '[]' Yes    >If yes, select from the following: Location and pattern of muscle symptoms (select all that apply) '[]' Symmetric, hip flexors or thighs '[]' Symmetric, calves '[]' Symmetric, proximal upper extremity '[]' Asymmetric, intermittent, or not specific to any area '[]' Unknown   Timing of muscle symptom in relation to starting statin regimen '[]' <4 weeks '[]' 4-12 weeks '[]' >12 weeks '[]' Unknown   Timing of muscle symptoms improvement after withdrawal of statin '[]' <2 weeks '[]' 2-4 weeks '[]' No improvement after 4 weeks '[]' Unknown  3. Was patient re-challenged with a statin regimen (even if same statin compound or regimen as above)?  '[]' No  '[]' Yes  '[]' Unknown  >If yes, select from the following: Timing of recurrence of similar muscle symptoms in relation to starting second regimen '[]' <4 weeks '[]' 4-12 weeks '[]' >12 weeks '[]' Similar symptoms did not recur '[]' Unknown   [email or web address]

## 2019-12-26 ENCOUNTER — Encounter: Payer: Self-pay | Admitting: Nurse Practitioner

## 2019-12-26 ENCOUNTER — Other Ambulatory Visit: Payer: Self-pay

## 2019-12-26 ENCOUNTER — Ambulatory Visit (INDEPENDENT_AMBULATORY_CARE_PROVIDER_SITE_OTHER): Payer: HRSA Program | Admitting: Nurse Practitioner

## 2019-12-26 VITALS — BP 112/66 | HR 56 | Temp 98.5°F | Ht 64.0 in | Wt 156.0 lb

## 2019-12-26 DIAGNOSIS — E118 Type 2 diabetes mellitus with unspecified complications: Secondary | ICD-10-CM

## 2019-12-26 DIAGNOSIS — IMO0002 Reserved for concepts with insufficient information to code with codable children: Secondary | ICD-10-CM

## 2019-12-26 DIAGNOSIS — E1165 Type 2 diabetes mellitus with hyperglycemia: Secondary | ICD-10-CM

## 2019-12-26 LAB — GLUCOSE, POCT (MANUAL RESULT ENTRY): POC Glucose: 227 mg/dl — AB (ref 70–99)

## 2019-12-26 MED ORDER — GLYBURIDE 5 MG PO TABS: 10.0000 mg | ORAL_TABLET | Freq: Every day | ORAL | 0 refills | Status: DC

## 2019-12-26 MED ORDER — "INSULIN SYRINGE-NEEDLE U-100 27G X 1/2"" 0.5 ML MISC": 10.0000 [IU] | Freq: Every day | 2 refills | Status: DC

## 2019-12-26 NOTE — Patient Instructions (Signed)
http://www.diabetes.org/living-with-diabetes/treatment-and-care/medication/insulin/insulin-storage-and-syringe-safety.html">  Insulin Storage and Care  All insulin pens and bottles (vials) have expiration dates. Insulin pens and vials that are refrigerated and unopened are good until the expiration date. Once opened, vials are good for 28 days. "Opened" means that the rubber has been punctured. Once in use, pen expiration dates vary depending on the type of insulin being used. Do not use insulin after the expiration date. Always follow the instructions that come with your insulin. How to store your insulin  Store your insulin according to instructions on the packaging.  If insulin is kept at room temperature, keep the temperature between 56-76F (13-27C). ? Opened insulin vials may be kept at room temperature, or in the refrigerator and warmed to room temperature before use. ? Opened insulin pens should be kept at room temperature.  If insulin is kept in the refrigerator, keep the temperature between 36-20F (3-8C).  Do not freeze insulin.  Keep insulin away from direct heat or sunlight. How to throw away your supplies  Throw away your insulin if: ? It is discolored. ? It is thick. ? It has clumps in it. ? It has white particles suspended in it.  Discard all used needles in a puncture-proof sharps disposal container. You can ask your local pharmacy about where you can get this kind of disposal container, or you can use an empty liquid laundry detergent bottle that has a lid, for example.  Follow the disposal regulations for the area where you live.  Do not use any syringe or needle more than one time.  Throw away empty vials and pens (with needles removed) in the regular trash. General recommendations  Always keep extra insulin and supplies with you.  Always inspect your insulin prior to injecting. Do not use if you notice any discoloration, particles, or clumping.  Mix cloudy  insulin by gently rolling the vial between your hands or "rocking" the pen from end to end at least 10 times.  Do not leave insulin in your vehicle or in any place where it can get too hot or too cold.  Use an insulated travel pack to store your insulin vials or pens when traveling. Where to find more information  American Diabetes Association: www.diabetes.[employer] of Diabetes and Digestive and Kidney Diseases: StreamsVideo.gl Summary  Do not store insulin in extreme heat or cold, such as in the freezer, direct sunlight, or your vehicle.  Check the expiration date before using insulin and do not use insulin past the expiration date.  Check insulin before using to make sure it looks normal. Mix cloudy insulin before using. Do not use insulin if it is discolored, has particles, or clumps. This information is not intended to replace advice given to you by your health care provider. Make sure you discuss any questions you have with your health care provider. Document Revised: 05/26/2016 Document Reviewed: 05/26/2016 Elsevier Patient Education  2020 [employer].

## 2019-12-26 NOTE — Progress Notes (Signed)
[facility] Patient [facility] [address] [place], [place]  [address] Phone:  [phone]   Fax:  [phone]   Established Patient Office Visit  Subjective:  Patient ID: Rebecca [name], female    DOB: 13-Dec-1955  Age: 64 y.o. MRN: [id number]  CC:  Chief Complaint  Patient presents with   Follow-up    one month f/u have sutures in left leg from accident wants to talk about removing  them.     It's a workers comp case    HPI Rebecca [name] presents for  6 week follow up. She  has a past medical history of CAD (coronary artery disease), Diabetes mellitus without complication (HCC), DM (diabetes mellitus), type 2, uncontrolled with complications (HCC) (04/06/2012), Hypertension, Hypertensive emergency (11/14/2012), Hypokalemia (11/14/2012), Hyponatremia (04/08/2012), Mixed hyperlipidemia (04/06/2012), NSTEMI (non-ST elevated myocardial infarction) (HCC) (11/14/2012), and Tobacco abuse (04/06/2012).   Diabetes Mellitus Patient presents for follow up of diabetes. She was to start Lantus. She admits that she was given the vial and pharmacy did not assist patient in administration of the insulin so she has not started this treatment. She was asked why she did not call the office for assistance of why the pharmacy did not call for clarity? Current symptoms include: hyperglycemia and paresthesia of the feet. Symptoms have stabilized. Patient denies foot ulcerations, hypoglycemia , increased appetite, nausea, polydipsia, polyuria, vomiting and weight loss. Evaluation to date has included: fasting blood sugar, fasting lipid panel, hemoglobin A1C and microalbuminuria.  Home sugars: BGs are high in the morning, BGs are high in the evening. Current treatment: Added insulin which has been unable to assess effectiveness, Continued sulfonylurea which has been not very effective, Continued metformin which has been not very effective, Continued statin which has been not very effective and Continued ACE  inhibitor/ARB which has been effective.    Past Medical History:  Diagnosis Date   CAD (coronary artery disease)    Diabetes mellitus without complication (HCC)    DM (diabetes mellitus), type 2, uncontrolled with complications (HCC) 04/06/2012   Hypertension    Hypertensive emergency 11/14/2012   Hypokalemia 11/14/2012   Hyponatremia 04/08/2012   Mixed hyperlipidemia 04/06/2012   NSTEMI (non-ST elevated myocardial infarction) (HCC) 11/14/2012   Tobacco abuse 04/06/2012    Past Surgical History:  Procedure Laterality Date   FEMORAL-POPLITEAL BYPASS GRAFT Left 09/20/2018   Procedure: Left  FEMORAL-BELOW KNEE POPLITEAL ARTERY Bypass with Translocated nonreversed saphenous vein;  Surgeon: Larina Earthly, MD;  Location: [facility] OR;  Service: Vascular;  Laterality: Left;   LEFT HEART CATHETERIZATION WITH CORONARY ANGIOGRAM N/A 11/14/2012   Procedure: LEFT HEART CATHETERIZATION WITH CORONARY ANGIOGRAM;  Surgeon: Pamella Pert, MD;  Location: [facility] CATH LAB;  Service: Cardiovascular;  Laterality: N/A;   LOWER EXTREMITY ANGIOGRAPHY N/A 09/05/2018   Procedure: LOWER EXTREMITY ANGIOGRAPHY;  Surgeon: Nada Libman, MD;  Location: [facility] INVASIVE CV LAB;  Service: Cardiovascular;  Laterality: N/A;   PERCUTANEOUS CORONARY STENT INTERVENTION (PCI-S)  11/14/2012   Procedure: PERCUTANEOUS CORONARY STENT INTERVENTION (PCI-S);  Surgeon: Pamella Pert, MD;  Location: [facility] CATH LAB;  Service: Cardiovascular;;   TONSILLECTOMY      Family History  Problem Relation Age of Onset   Diabetes Brother    Heart disease Brother    Arthritis Brother    Diabetes Father    Heart disease Father     Social History   Socioeconomic History   Marital status: Single    Spouse name: Not on file  Number of children: Not on file   Years of education: Not on file   Highest education level: Not on file  Occupational History   Not on file  Tobacco Use   Smoking status: Former Smoker    Packs/day:  1.00    Types: Cigarettes   Smokeless tobacco: Never Used  Vaping Use   Vaping Use: Never used  Substance and Sexual Activity   Alcohol use: Yes    Comment: occ   Drug use: No   Sexual activity: Not on file  Other Topics Concern   Not on file  Social History Narrative   Not on file   Social Determinants of Health   Financial Resource Strain:    Difficulty of Paying Living Expenses: Not on file  Food Insecurity:    Worried About Running Out of Food in the Last Year: Not on file   Ran Out of Food in the Last Year: Not on file  Transportation Needs:    Lack of Transportation (Medical): Not on file   Lack of Transportation (Non-Medical): Not on file  Physical Activity:    Days of Exercise per Week: Not on file   Minutes of Exercise per Session: Not on file  Stress:    Feeling of Stress : Not on file  Social Connections:    Frequency of Communication with Friends and Family: Not on file   Frequency of Social Gatherings with Friends and Family: Not on file   Attends Religious Services: Not on file   Active Member of Clubs or Organizations: Not on file   Attends BankerClub or Organization Meetings: Not on file   Marital Status: Not on file  Intimate Partner Violence:    Fear of Current or Ex-Partner: Not on file   Emotionally Abused: Not on file   Physically Abused: Not on file   Sexually Abused: Not on file    Outpatient Medications Prior to Visit  Medication Sig Dispense Refill   amLODipine (NORVASC) 10 MG tablet Take 1 tablet (10 mg total) by mouth daily. 90 tablet 1   aspirin 81 MG chewable tablet Chew 81 mg by mouth daily.     atorvastatin (LIPITOR) 80 MG tablet Take 1 tablet (80 mg total) by mouth daily at 6 PM. 90 tablet 3   hydrochlorothiazide (HYDRODIURIL) 25 MG tablet Take 1 tablet (25 mg total) by mouth daily. 90 tablet 3   KRILL OIL 1000 MG CAPS Take 1,000 mg by mouth daily.      lisinopril (ZESTRIL) 40 MG tablet Take 1 tablet (40 mg  total) by mouth daily. 90 tablet 3   metFORMIN (GLUCOPHAGE) 1000 MG tablet Take 1 tablet (1,000 mg total) by mouth 2 (two) times daily with a meal. 180 tablet 3   metoprolol tartrate (LOPRESSOR) 25 MG tablet Take 1 tablet (25 mg total) by mouth 2 (two) times daily. 60 tablet 3   glyBURIDE (DIABETA) 5 MG tablet TAKE 2 TABLETS BY MOUTH ONCE DAILY WITH BREAKFAST 60 tablet 0   insulin glargine (LANTUS) 100 UNIT/ML injection Inject 0.1 mLs (10 Units total) into the skin at bedtime. (Patient not taking: Reported on 12/26/2019) 10 mL 11   No facility-administered medications prior to visit.    Allergies  Allergen Reactions   Penicillins Rash and Other (See Comments)    Did it involve swelling of the face/tongue/throat, SOB, or low BP? No Did it involve sudden or SEVERE rash/hives, skin peeling, or any reaction on the inside of your mouth or nose? #  #  #  YES  #  #  #  Did you need to seek medical attention at a hospital or doctor's office? #  #  #  YES  #  #  #  When did it last happen? Unknown    ROS Review of Systems  All other systems reviewed and are negative.     Objective:    Physical Exam Constitutional:      General: She is not in acute distress.    Appearance: She is not ill-appearing, toxic-appearing or diaphoretic.  HENT:     Head: Normocephalic.  Cardiovascular:     Rate and Rhythm: Normal rate and regular rhythm.     Pulses: Normal pulses.     Heart sounds: Normal heart sounds.  Pulmonary:     Effort: Pulmonary effort is normal.     Breath sounds: Normal breath sounds.  Musculoskeletal:        General: Normal range of motion.     Cervical back: Normal range of motion.  Skin:    General: Skin is warm.     Coloration: Skin is pale.     Comments: Laceration with sutures to left inner thigh  Neurological:     Mental Status: She is alert.     BP 112/66    Pulse (!) 56    Temp 98.5 F (36.9 C) (Temporal)    Ht 5\' 4"  (1.626 m)    Wt 156 lb (70.8 kg)    SpO2 98%     BMI 26.78 kg/m  Wt Readings from Last 3 Encounters:  12/26/19 156 lb (70.8 kg)  11/15/19 154 lb 0.6 oz (69.9 kg)  12/25/18 164 lb (74.4 kg)     Health Maintenance Due  Topic Date Due   INFLUENZA VACCINE  12/01/2019    There are no preventive care reminders to display for this patient.  Lab Results  Component Value Date   TSH 1.190 11/30/2017   Lab Results  Component Value Date   WBC 5.3 11/15/2019   HGB 12.3 11/15/2019   HCT 37.5 11/15/2019   MCV 87 11/15/2019   PLT 198 11/15/2019   Lab Results  Component Value Date   NA 140 11/15/2019   K 3.9 11/15/2019   CO2 27 12/19/2018   GLUCOSE 182 (H) 11/15/2019   BUN 17 11/15/2019   CREATININE 0.95 11/15/2019   BILITOT 0.5 11/15/2019   ALKPHOS 128 (H) 11/15/2019   AST 14 11/15/2019   ALT 19 08/27/2018   PROT 7.1 11/15/2019   ALBUMIN 4.1 11/15/2019   CALCIUM 8.8 11/15/2019   ANIONGAP 14 09/20/2018   Lab Results  Component Value Date   CHOL 354 (H) 11/15/2019   Lab Results  Component Value Date   HDL 24 (L) 11/15/2019   Lab Results  Component Value Date   LDLCALC Comment (A) 11/15/2019   Lab Results  Component Value Date   TRIG 1,349 (HH) 11/15/2019   Lab Results  Component Value Date   CHOLHDL 14.8 (H) 11/15/2019   Lab Results  Component Value Date   HGBA1C 10.8 (A) 11/15/2019   HGBA1C 10.8 11/15/2019   HGBA1C 10.8 (A) 11/15/2019   HGBA1C 10.8 (A) 11/15/2019      Assessment & Plan:   Problem List Items Addressed This Visit      Endocrine   DM (diabetes mellitus), type 2, uncontrolled with complications (HCC) - Primary Encourage compliance with current treatment regimen  Dose adjustment :Lantus 10 units qhs with education on how to draw up  and administer with demonstration. Pt Verbalized understanding Encourage regular CBG monitoring Encourage contacting office if excessive hyperglycemia and or hypoglycemia Lifestyle modification with healthy diet (fewer calories, more high fiber foods,  whole grains and non-starchy vegetables, lower fat meat and fish, low-fat diary include healthy oils) regular exercise (physical activity) and weight loss Opthalmology exam discussed  Nutritional consult recommended Regular dental visits encouraged Home BP monitoring also encouraged goal <130/80     Relevant Medications   glyBURIDE (DIABETA) 5 MG tablet   Other Relevant Orders   POCT glucose (manual entry) (Completed)       Meds ordered this encounter  Medications   glyBURIDE (DIABETA) 5 MG tablet    Sig: Take 2 tablets (10 mg total) by mouth daily with breakfast. TAKE 2 TABLETS BY MOUTH ONCE DAILY WITH BREAKFAST    Dispense:  60 tablet    Refill:  0    Order Specific Question:   Supervising Provider    Answer:   Quentin Angst [[id number]]   Insulin Syringe-Needle U-100 27G X 1/2" 0.5 ML MISC    Sig: 10 Units by Does not apply route at bedtime.    Dispense:  100 each    Refill:  2    Order Specific Question:   Supervising Provider    Answer:   Quentin Angst [id number]    Follow-up: Return in about 2 months (around 02/25/2020).    Barbette Merino, NP

## 2020-02-26 ENCOUNTER — Ambulatory Visit: Payer: Self-pay | Admitting: Nurse Practitioner

## 2020-03-02 ENCOUNTER — Other Ambulatory Visit: Payer: Self-pay | Admitting: Nurse Practitioner

## 2020-03-02 DIAGNOSIS — E1165 Type 2 diabetes mellitus with hyperglycemia: Secondary | ICD-10-CM

## 2020-03-02 DIAGNOSIS — IMO0002 Reserved for concepts with insufficient information to code with codable children: Secondary | ICD-10-CM

## 2020-03-11 ENCOUNTER — Ambulatory Visit: Payer: Self-pay | Admitting: Nurse Practitioner

## 2020-03-18 ENCOUNTER — Ambulatory Visit: Payer: Self-pay | Admitting: Nurse Practitioner

## 2020-06-05 ENCOUNTER — Other Ambulatory Visit: Payer: Self-pay

## 2020-06-05 DIAGNOSIS — I739 Peripheral vascular disease, unspecified: Secondary | ICD-10-CM

## 2020-06-09 ENCOUNTER — Encounter: Payer: Self-pay | Admitting: Vascular Surgery

## 2020-06-09 ENCOUNTER — Other Ambulatory Visit: Payer: Self-pay

## 2020-06-09 ENCOUNTER — Ambulatory Visit (INDEPENDENT_AMBULATORY_CARE_PROVIDER_SITE_OTHER): Payer: Self-pay | Admitting: Vascular Surgery

## 2020-06-09 ENCOUNTER — Ambulatory Visit (HOSPITAL_COMMUNITY)
Admission: RE | Admit: 2020-06-09 | Discharge: 2020-06-09 | Disposition: A | Discharge status: Home / Self Care | Payer: HRSA Program | Source: Ambulatory Visit | Attending: Vascular Surgery | Admitting: Vascular Surgery

## 2020-06-09 ENCOUNTER — Ambulatory Visit (HOSPITAL_COMMUNITY)
Admission: RE | Admit: 2020-06-09 | Discharge: 2020-06-09 | Disposition: A | Discharge status: Home / Self Care | Payer: Self-pay | Source: Ambulatory Visit | Attending: Vascular Surgery | Admitting: Vascular Surgery

## 2020-06-09 VITALS — BP 137/78 | HR 76 | Temp 98.1°F | Resp 20 | Ht 64.0 in | Wt 159.0 lb

## 2020-06-09 DIAGNOSIS — I739 Peripheral vascular disease, unspecified: Secondary | ICD-10-CM | POA: Insufficient documentation

## 2020-06-09 DIAGNOSIS — I70221 Atherosclerosis of native arteries of extremities with rest pain, right leg: Secondary | ICD-10-CM

## 2020-06-09 NOTE — Progress Notes (Signed)
ASSESSMENT & PLAN:  65 y.o. 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  Recommend the following which can slow the progression of atherosclerosis and reduce the risk of major adverse cardiac / limb events:  Complete cessation from all tobacco products. Blood glucose control with goal A1c < 7%. Blood pressure control with goal blood pressure < 140/90 mmHg. Lipid reduction therapy with goal LDL-C <100 mg/dL (<92 if symptomatic from PAD).  Aspirin 81mg  PO QD.  Atorvastatin 40-80mg  PO QD (or other "high intensity" statin therapy).  Her ABI is quite concerning.  Her symptoms are more mild than the ABI suggests.  We'll plan for right lower extremity angiogram via left common femoral artery approach 06/17/20.  CHIEF COMPLAINT:   Right foot pain  HISTORY:  HISTORY OF PRESENT ILLNESS: Rebecca [name] is a 65 y.o. female well-known to our service.  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 09/22/2018.  He tolerated this well and has healed completely.  She returns to clinic reporting severe pain in the right foot over the past 3 weeks concerning for ischemic rest pain.  She has no wounds about the right foot.  Angiogram 09/05/2018 personally reviewed.  On the right she has diffuse but no flow-limiting disease in her SFA and popliteal artery.  Of note she has peroneal only runoff bilaterally.   Past Medical History:  Diagnosis Date  . CAD (coronary artery disease)   . Diabetes mellitus without complication (HCC)   . DM (diabetes mellitus), type 2, uncontrolled with complications (HCC) 04/06/2012  . Hypertension   . Hypertensive emergency 11/14/2012  . Hypokalemia 11/14/2012  . Hyponatremia 04/08/2012  . Mixed hyperlipidemia 04/06/2012  . NSTEMI (non-ST elevated myocardial infarction) (HCC) 11/14/2012  . Tobacco abuse 04/06/2012    Past Surgical History:   Procedure Laterality Date  . FEMORAL-POPLITEAL BYPASS GRAFT Left 09/20/2018   Procedure: Left  FEMORAL-BELOW KNEE POPLITEAL ARTERY Bypass with Translocated nonreversed saphenous vein;  Surgeon: 09/22/2018, MD;  Location: [facility] OR;  Service: Vascular;  Laterality: Left;  . LEFT HEART CATHETERIZATION WITH CORONARY ANGIOGRAM N/A 11/14/2012   Procedure: LEFT HEART CATHETERIZATION WITH CORONARY ANGIOGRAM;  Surgeon: 11/16/2012, MD;  Location: [facility] CATH LAB;  Service: Cardiovascular;  Laterality: N/A;  . LOWER EXTREMITY ANGIOGRAPHY N/A 09/05/2018   Procedure: LOWER EXTREMITY ANGIOGRAPHY;  Surgeon: 11/05/2018, MD;  Location: [facility] INVASIVE CV LAB;  Service: Cardiovascular;  Laterality: N/A;  . PERCUTANEOUS CORONARY STENT INTERVENTION (PCI-S)  11/14/2012   Procedure: PERCUTANEOUS CORONARY STENT INTERVENTION (PCI-S);  Surgeon: 11/16/2012, MD;  Location: [facility] CATH LAB;  Service: Cardiovascular;;  . TONSILLECTOMY      Family History  Problem Relation Age of Onset  . Diabetes Brother   . Heart disease Brother   . Arthritis Brother   . Diabetes Father   . Heart disease Father     Social History   Socioeconomic History  . Marital status: Single    Spouse name: Not on file  . Number of children: Not on file  . Years of education: Not on file  . Highest education level: Not on file  Occupational History  . Not on file  Tobacco Use  . Smoking status: Former Smoker    Packs/day: 1.00    Types: Cigarettes  . Smokeless tobacco: Never Used  Vaping Use  . Vaping Use: Never used  Substance and Sexual Activity  . Alcohol use: Yes    Comment: occ  . Drug use: No  . Sexual activity: Not on file  Other Topics Concern  . Not on file  Social History Narrative  . Not on file   Social Determinants of Health   Financial Resource Strain: Not on file  Food Insecurity: Not on file  Transportation Needs: Not on file  Physical Activity: Not on file  Stress: Not on file  Social Connections:  Not on file  Intimate Partner Violence: Not on file    Allergies  Allergen Reactions  . Penicillins Rash and Other (See Comments)    Did it involve swelling of the face/tongue/throat, SOB, or low BP? No Did it involve sudden or SEVERE rash/hives, skin peeling, or any reaction on the inside of your mouth or nose? #  #  #  YES  #  #  #  Did you need to seek medical attention at a hospital or doctor's office? #  #  #  YES  #  #  #  When did it last happen? Unknown    Current Outpatient Medications  Medication Sig Dispense Refill  . aspirin 81 MG chewable tablet Chew 81 mg by mouth daily.    . atorvastatin (LIPITOR) 80 MG tablet Take 1 tablet (80 mg total) by mouth daily at 6 PM. 90 tablet 3  . glyBURIDE (DIABETA) 5 MG tablet TAKE 2 TABLETS BY MOUTH ONCE DAILY WITH BREAKFAST 60 tablet 0  . Insulin Syringe-Needle U-100 27G X 1/2" 0.5 ML MISC 10 Units by Does not apply route at bedtime. 100 each 2  . KRILL OIL 1000 MG CAPS Take 1,000 mg by mouth daily.     . metFORMIN (GLUCOPHAGE) 1000 MG tablet Take 1 tablet (1,000 mg total) by mouth 2 (two) times daily with a meal. 180 tablet 3  . metoprolol tartrate (LOPRESSOR) 25 MG tablet Take 1 tablet (25 mg total) by mouth 2 (two) times daily. 60 tablet 3  . amLODipine (NORVASC) 10 MG tablet Take 1 tablet (10 mg total) by mouth daily. 90 tablet 1  . hydrochlorothiazide (HYDRODIURIL) 25 MG tablet Take 1 tablet (25 mg total) by mouth daily. 90 tablet 3  . insulin glargine (LANTUS) 100 UNIT/ML injection Inject 0.1 mLs (10 Units total) into the skin at bedtime. (Patient not taking: No sig reported) 10 mL 11  . lisinopril (ZESTRIL) 40 MG tablet Take 1 tablet (40 mg total) by mouth daily. 90 tablet 3   No current facility-administered medications for this visit.    REVIEW OF SYSTEMS:  [X] denotes positive finding, [ ] denotes negative finding Cardiac  Comments:  Chest pain or chest pressure:    Shortness of breath upon exertion:    Short of breath when  lying flat:    Irregular heart rhythm:        Vascular    Pain in calf, thigh, or hip brought on by ambulation: x   Pain in feet at night that wakes you up from your sleep:  x   Blood clot in your veins:    Leg swelling:  x       Pulmonary    Oxygen at home:    Productive cough:     Wheezing:         Neurologic    Sudden weakness in arms or legs:     Sudden numbness in arms or legs:     Sudden onset of difficulty   speaking or slurred speech:    Temporary loss of vision in one eye:     Problems with dizziness:         Gastrointestinal    Blood in stool:     Vomited blood:         Genitourinary    Burning when urinating:     Blood in urine:        Psychiatric    Major depression:         Hematologic    Bleeding problems:    Problems with blood clotting too easily:        Skin    Rashes or ulcers:        Constitutional    Fever or chills:     PHYSICAL EXAM:   Vitals:   06/09/20 1615  BP: 137/78  Pulse: 76  Resp: 20  Temp: 98.1 F (36.7 C)  SpO2: 92%  Weight: 159 lb (72.1 kg)  Height: 5\' 4"  (1.626 m)   Constitutional: well appearing in no distress. Appears well nourished.  Neurologic: CN intact. no focal findings. no sensory loss. Psychiatric: Mood and affect symmetric and appropriate. Eyes: No icterus. No conjunctival pallor. Ears, nose, throat: mucous membranes moist. Midline trachea.  Cardiac: regular rate and rhythm.  Respiratory: unlabored. Abdominal: soft, non-tender, non-distended.  Peripheral vascular:  No palpable pulses in either foot  R foot warm  2-5s capillary refill R foot Extremity: No edema. No cyanosis. No pallor.  Skin: No gangrene. No ulceration.  Lymphatic: No Stemmer's sign. No palpable lymphadenopathy.  DATA REVIEW:    Most recent CBC CBC Latest Ref Rng & Units 11/15/2019 09/20/2018 09/20/2018  WBC 3.4 - 10.8 x10E3/uL 5.3 9.9 8.7  Hemoglobin 11.1 - 15.9 g/dL 09/22/2018 10.0(L) 11.7(L)  Hematocrit 34.0 - 46.6 % 37.5 29.3(L) 34.6(L)   Platelets 150 - 450 x10E3/uL 198 248 241     Most recent CMP CMP Latest Ref Rng & Units 11/15/2019 12/19/2018 09/20/2018  Glucose 65 - 99 mg/dL 09/22/2018) 147(W) 295(A)  BUN 8 - 27 mg/dL 17 14 11   Creatinine 0.57 - 1.00 mg/dL 213(Y 8.65  Sodium 134 - 144 mmol/L 140 138 134(L)  Potassium 3.5 - 5.2 mmol/L 3.9 3.8 4.0  Chloride 96 - 106 mmol/L 101 93(L) 96(L)  CO2 20 - 29 mmol/L - 27 24  Calcium 8.7 - 10.3 mg/dL 8.8 8.8 7.84)  Total Protein 6.0 - 8.5 g/dL 7.1 - -  Total Bilirubin 0.0 - 1.2 mg/dL 0.5 - -  Alkaline Phos 48 - 121 IU/L 128(H) - -  AST 0 - 40 IU/L 14 - -  ALT 0 - 32 IU/L - - -    Renal function CrCl cannot be calculated (Patient's most recent lab result is older than the maximum 21 days allowed.).  Hemoglobin A1C (%)  Date Value  11/15/2019 10.8 (A)   HbA1c, POC (prediabetic range) (%)  Date Value  11/15/2019 10.8 (A)   HbA1c, POC (controlled diabetic range) (%)  Date Value  11/15/2019 10.8 (A)   HbA1c POC (<> result, manual entry) (%)  Date Value  11/15/2019 10.8    LDL Chol Calc (NIH)  Date Value Ref Range Status  11/15/2019 Comment (A) 0 - 99 mg/dL Final    Comment:    Triglyceride result indicated is too high for an accurate LDL cholesterol estimation.      Vascular Imaging: Left lower extremity arterial duplex 06/09/2020 Possible inflow/proximal anastomosis stenosis but no other abnormalities identified  ABI  06/09/2018   Rande Brunt. Lenell Antu, MD [facility] Phone Number: [phone] 06/09/2020 5:39 PM

## 2020-06-09 NOTE — H&P (View-Only) (Signed)
ASSESSMENT & PLAN:  65 y.o. 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  Recommend the following which can slow the progression of atherosclerosis and reduce the risk of major adverse cardiac / limb events:  Complete cessation from all tobacco products. Blood glucose control with goal A1c < 7%. Blood pressure control with goal blood pressure < 140/90 mmHg. Lipid reduction therapy with goal LDL-C <100 mg/dL (<92 if symptomatic from PAD).  Aspirin 81mg  PO QD.  Atorvastatin 40-80mg  PO QD (or other "high intensity" statin therapy).  Her ABI is quite concerning.  Her symptoms are more mild than the ABI suggests.  We'll plan for right lower extremity angiogram via left common femoral artery approach 06/17/20.  CHIEF COMPLAINT:   Right foot pain  HISTORY:  HISTORY OF PRESENT ILLNESS: Rebecca [name] is a 65 y.o. female well-known to our service.  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 09/22/2018.  He tolerated this well and has healed completely.  She returns to clinic reporting severe pain in the right foot over the past 3 weeks concerning for ischemic rest pain.  She has no wounds about the right foot.  Angiogram 09/05/2018 personally reviewed.  On the right she has diffuse but no flow-limiting disease in her SFA and popliteal artery.  Of note she has peroneal only runoff bilaterally.   Past Medical History:  Diagnosis Date  . CAD (coronary artery disease)   . Diabetes mellitus without complication (HCC)   . DM (diabetes mellitus), type 2, uncontrolled with complications (HCC) 04/06/2012  . Hypertension   . Hypertensive emergency 11/14/2012  . Hypokalemia 11/14/2012  . Hyponatremia 04/08/2012  . Mixed hyperlipidemia 04/06/2012  . NSTEMI (non-ST elevated myocardial infarction) (HCC) 11/14/2012  . Tobacco abuse 04/06/2012    Past Surgical History:   Procedure Laterality Date  . FEMORAL-POPLITEAL BYPASS GRAFT Left 09/20/2018   Procedure: Left  FEMORAL-BELOW KNEE POPLITEAL ARTERY Bypass with Translocated nonreversed saphenous vein;  Surgeon: 09/22/2018, MD;  Location: [facility] OR;  Service: Vascular;  Laterality: Left;  . LEFT HEART CATHETERIZATION WITH CORONARY ANGIOGRAM N/A 11/14/2012   Procedure: LEFT HEART CATHETERIZATION WITH CORONARY ANGIOGRAM;  Surgeon: 11/16/2012, MD;  Location: [facility] CATH LAB;  Service: Cardiovascular;  Laterality: N/A;  . LOWER EXTREMITY ANGIOGRAPHY N/A 09/05/2018   Procedure: LOWER EXTREMITY ANGIOGRAPHY;  Surgeon: 11/05/2018, MD;  Location: [facility] INVASIVE CV LAB;  Service: Cardiovascular;  Laterality: N/A;  . PERCUTANEOUS CORONARY STENT INTERVENTION (PCI-S)  11/14/2012   Procedure: PERCUTANEOUS CORONARY STENT INTERVENTION (PCI-S);  Surgeon: 11/16/2012, MD;  Location: [facility] CATH LAB;  Service: Cardiovascular;;  . TONSILLECTOMY      Family History  Problem Relation Age of Onset  . Diabetes Brother   . Heart disease Brother   . Arthritis Brother   . Diabetes Father   . Heart disease Father     Social History   Socioeconomic History  . Marital status: Single    Spouse name: Not on file  . Number of children: Not on file  . Years of education: Not on file  . Highest education level: Not on file  Occupational History  . Not on file  Tobacco Use  . Smoking status: Former Smoker    Packs/day: 1.00    Types: Cigarettes  . Smokeless tobacco: Never Used  Vaping Use  . Vaping Use: Never used  Substance and Sexual Activity  . Alcohol use: Yes    Comment: occ  . Drug use: No  . Sexual activity: Not on file  Other Topics Concern  . Not on file  Social History Narrative  . Not on file   Social Determinants of Health   Financial Resource Strain: Not on file  Food Insecurity: Not on file  Transportation Needs: Not on file  Physical Activity: Not on file  Stress: Not on file  Social Connections:  Not on file  Intimate Partner Violence: Not on file    Allergies  Allergen Reactions  . Penicillins Rash and Other (See Comments)    Did it involve swelling of the face/tongue/throat, SOB, or low BP? No Did it involve sudden or SEVERE rash/hives, skin peeling, or any reaction on the inside of your mouth or nose? #  #  #  YES  #  #  #  Did you need to seek medical attention at a hospital or doctor's office? #  #  #  YES  #  #  #  When did it last happen? Unknown    Current Outpatient Medications  Medication Sig Dispense Refill  . aspirin 81 MG chewable tablet Chew 81 mg by mouth daily.    Marland Kitchen atorvastatin (LIPITOR) 80 MG tablet Take 1 tablet (80 mg total) by mouth daily at 6 PM. 90 tablet 3  . glyBURIDE (DIABETA) 5 MG tablet TAKE 2 TABLETS BY MOUTH ONCE DAILY WITH BREAKFAST 60 tablet 0  . Insulin Syringe-Needle U-100 27G X 1/2" 0.5 ML MISC 10 Units by Does not apply route at bedtime. 100 each 2  . KRILL OIL 1000 MG CAPS Take 1,000 mg by mouth daily.     . metFORMIN (GLUCOPHAGE) 1000 MG tablet Take 1 tablet (1,000 mg total) by mouth 2 (two) times daily with a meal. 180 tablet 3  . metoprolol tartrate (LOPRESSOR) 25 MG tablet Take 1 tablet (25 mg total) by mouth 2 (two) times daily. 60 tablet 3  . amLODipine (NORVASC) 10 MG tablet Take 1 tablet (10 mg total) by mouth daily. 90 tablet 1  . hydrochlorothiazide (HYDRODIURIL) 25 MG tablet Take 1 tablet (25 mg total) by mouth daily. 90 tablet 3  . insulin glargine (LANTUS) 100 UNIT/ML injection Inject 0.1 mLs (10 Units total) into the skin at bedtime. (Patient not taking: No sig reported) 10 mL 11  . lisinopril (ZESTRIL) 40 MG tablet Take 1 tablet (40 mg total) by mouth daily. 90 tablet 3   No current facility-administered medications for this visit.    REVIEW OF SYSTEMS:  [X]  denotes positive finding, [ ]  denotes negative finding Cardiac  Comments:  Chest pain or chest pressure:    Shortness of breath upon exertion:    Short of breath when  lying flat:    Irregular heart rhythm:        Vascular    Pain in calf, thigh, or hip brought on by ambulation: x   Pain in feet at night that wakes you up from your sleep:  x   Blood clot in your veins:    Leg swelling:  x       Pulmonary    Oxygen at home:    Productive cough:     Wheezing:         Neurologic    Sudden weakness in arms or legs:     Sudden numbness in arms or legs:     Sudden onset of difficulty  speaking or slurred speech:    Temporary loss of vision in one eye:     Problems with dizziness:         Gastrointestinal    Blood in stool:     Vomited blood:         Genitourinary    Burning when urinating:     Blood in urine:        Psychiatric    Major depression:         Hematologic    Bleeding problems:    Problems with blood clotting too easily:        Skin    Rashes or ulcers:        Constitutional    Fever or chills:     PHYSICAL EXAM:   Vitals:   06/09/20 1615  BP: 137/78  Pulse: 76  Resp: 20  Temp: 98.1 F (36.7 C)  SpO2: 92%  Weight: 159 lb (72.1 kg)  Height: 5\' 4"  (1.626 m)   Constitutional: well appearing in no distress. Appears well nourished.  Neurologic: CN intact. no focal findings. no sensory loss. Psychiatric: Mood and affect symmetric and appropriate. Eyes: No icterus. No conjunctival pallor. Ears, nose, throat: mucous membranes moist. Midline trachea.  Cardiac: regular rate and rhythm.  Respiratory: unlabored. Abdominal: soft, non-tender, non-distended.  Peripheral vascular:  No palpable pulses in either foot  R foot warm  2-5s capillary refill R foot Extremity: No edema. No cyanosis. No pallor.  Skin: No gangrene. No ulceration.  Lymphatic: No Stemmer's sign. No palpable lymphadenopathy.  DATA REVIEW:    Most recent CBC CBC Latest Ref Rng & Units 11/15/2019 09/20/2018 09/20/2018  WBC 3.4 - 10.8 x10E3/uL 5.3 9.9 8.7  Hemoglobin 11.1 - 15.9 g/dL 09/22/2018 10.0(L) 11.7(L)  Hematocrit 34.0 - 46.6 % 37.5 29.3(L) 34.6(L)   Platelets 150 - 450 x10E3/uL 198 248 241     Most recent CMP CMP Latest Ref Rng & Units 11/15/2019 12/19/2018 09/20/2018  Glucose 65 - 99 mg/dL 09/22/2018) 147(W) 295(A)  BUN 8 - 27 mg/dL 17 14 11   Creatinine 0.57 - 1.00 mg/dL 213(Y 8.65  Sodium 134 - 144 mmol/L 140 138 134(L)  Potassium 3.5 - 5.2 mmol/L 3.9 3.8 4.0  Chloride 96 - 106 mmol/L 101 93(L) 96(L)  CO2 20 - 29 mmol/L - 27 24  Calcium 8.7 - 10.3 mg/dL 8.8 8.8 7.84)  Total Protein 6.0 - 8.5 g/dL 7.1 - -  Total Bilirubin 0.0 - 1.2 mg/dL 0.5 - -  Alkaline Phos 48 - 121 IU/L 128(H) - -  AST 0 - 40 IU/L 14 - -  ALT 0 - 32 IU/L - - -    Renal function CrCl cannot be calculated (Patient's most recent lab result is older than the maximum 21 days allowed.).  Hemoglobin A1C (%)  Date Value  11/15/2019 10.8 (A)   HbA1c, POC (prediabetic range) (%)  Date Value  11/15/2019 10.8 (A)   HbA1c, POC (controlled diabetic range) (%)  Date Value  11/15/2019 10.8 (A)   HbA1c POC (<> result, manual entry) (%)  Date Value  11/15/2019 10.8    LDL Chol Calc (NIH)  Date Value Ref Range Status  11/15/2019 Comment (A) 0 - 99 mg/dL Final    Comment:    Triglyceride result indicated is too high for an accurate LDL cholesterol estimation.      Vascular Imaging: Left lower extremity arterial duplex 06/09/2020 Possible inflow/proximal anastomosis stenosis but no other abnormalities identified  ABI  06/09/2018   Rande Brunt. Lenell Antu, MD [facility] Phone Number: [phone] 06/09/2020 5:39 PM

## 2020-06-16 ENCOUNTER — Other Ambulatory Visit (HOSPITAL_COMMUNITY)
Admission: RE | Admit: 2020-06-16 | Discharge: 2020-06-16 | Disposition: A | Discharge status: Home / Self Care | Payer: HRSA Program | Source: Ambulatory Visit | Attending: Vascular Surgery | Admitting: Vascular Surgery

## 2020-06-16 DIAGNOSIS — Z01812 Encounter for preprocedural laboratory examination: Secondary | ICD-10-CM | POA: Diagnosis present

## 2020-06-16 DIAGNOSIS — Z20822 Contact with and (suspected) exposure to covid-19: Secondary | ICD-10-CM | POA: Diagnosis not present

## 2020-06-16 LAB — SARS CORONAVIRUS 2 (TAT 6-24 HRS): SARS Coronavirus 2: NEGATIVE

## 2020-06-17 ENCOUNTER — Other Ambulatory Visit: Payer: Self-pay

## 2020-06-17 ENCOUNTER — Encounter (HOSPITAL_COMMUNITY)
Admission: RE | Disposition: A | Discharge status: Home / Self Care | Payer: Self-pay | Source: Home / Self Care | Attending: Vascular Surgery

## 2020-06-17 ENCOUNTER — Ambulatory Visit (HOSPITAL_COMMUNITY)
Admission: RE | Admit: 2020-06-17 | Discharge: 2020-06-17 | Disposition: A | Discharge status: Home / Self Care | Payer: Self-pay | Attending: Vascular Surgery | Admitting: Vascular Surgery

## 2020-06-17 DIAGNOSIS — Z7984 Long term (current) use of oral hypoglycemic drugs: Secondary | ICD-10-CM | POA: Insufficient documentation

## 2020-06-17 DIAGNOSIS — Z7982 Long term (current) use of aspirin: Secondary | ICD-10-CM | POA: Insufficient documentation

## 2020-06-17 DIAGNOSIS — I70221 Atherosclerosis of native arteries of extremities with rest pain, right leg: Secondary | ICD-10-CM

## 2020-06-17 DIAGNOSIS — Z87891 Personal history of nicotine dependence: Secondary | ICD-10-CM | POA: Insufficient documentation

## 2020-06-17 DIAGNOSIS — Z880 Allergy status to penicillin: Secondary | ICD-10-CM | POA: Insufficient documentation

## 2020-06-17 DIAGNOSIS — E1151 Type 2 diabetes mellitus with diabetic peripheral angiopathy without gangrene: Secondary | ICD-10-CM | POA: Insufficient documentation

## 2020-06-17 DIAGNOSIS — Z794 Long term (current) use of insulin: Secondary | ICD-10-CM | POA: Insufficient documentation

## 2020-06-17 DIAGNOSIS — Z79899 Other long term (current) drug therapy: Secondary | ICD-10-CM | POA: Insufficient documentation

## 2020-06-17 HISTORY — PX: PERIPHERAL VASCULAR INTERVENTION: CATH118257

## 2020-06-17 HISTORY — PX: ABDOMINAL AORTOGRAM W/LOWER EXTREMITY: CATH118223

## 2020-06-17 LAB — POCT I-STAT, CHEM 8
BUN: 25 mg/dL — ABNORMAL HIGH (ref 8–23)
Calcium, Ion: 1.15 mmol/L (ref 1.15–1.40)
Chloride: 100 mmol/L (ref 98–111)
Creatinine, Ser: 0.9 mg/dL (ref 0.44–1.00)
Glucose, Bld: 394 mg/dL — ABNORMAL HIGH (ref 70–99)
HCT: 36 % (ref 36.0–46.0)
Hemoglobin: 12.2 g/dL (ref 12.0–15.0)
Potassium: 4.7 mmol/L (ref 3.5–5.1)
Sodium: 137 mmol/L (ref 135–145)
TCO2: 29 mmol/L (ref 22–32)

## 2020-06-17 LAB — POCT ACTIVATED CLOTTING TIME
Activated Clotting Time: 243 seconds
Activated Clotting Time: 225 seconds

## 2020-06-17 LAB — GLUCOSE, CAPILLARY: Glucose-Capillary: 337 mg/dL — ABNORMAL HIGH (ref 70–99)

## 2020-06-17 SURGERY — ABDOMINAL AORTOGRAM W/LOWER EXTREMITY
Anesthesia: LOCAL | Laterality: Right

## 2020-06-17 MED ORDER — LIDOCAINE HCL (PF) 1 % IJ SOLN
INTRAMUSCULAR | Status: AC
  Filled 2020-06-17: qty 30

## 2020-06-17 MED ORDER — SODIUM CHLORIDE 0.9% FLUSH: 3.0000 mL | INTRAVENOUS | Status: DC | PRN

## 2020-06-17 MED ORDER — SODIUM CHLORIDE 0.9% FLUSH: 3.0000 mL | Freq: Two times a day (BID) | INTRAVENOUS | Status: DC

## 2020-06-17 MED ORDER — CLOPIDOGREL BISULFATE 75 MG PO TABS
300.0000 mg | ORAL_TABLET | Freq: Once | ORAL | Status: AC
  Administered 2020-06-17: 300 mg via ORAL
  Filled 2020-06-17: qty 4

## 2020-06-17 MED ORDER — ONDANSETRON HCL 4 MG/2ML IJ SOLN: 4.0000 mg | Freq: Four times a day (QID) | INTRAMUSCULAR | Status: DC | PRN

## 2020-06-17 MED ORDER — ONDANSETRON HCL 4 MG/2ML IJ SOLN
INTRAMUSCULAR | Status: AC
  Filled 2020-06-17: qty 2

## 2020-06-17 MED ORDER — LIDOCAINE HCL (PF) 1 % IJ SOLN
INTRAMUSCULAR | Status: DC | PRN
  Administered 2020-06-17: 15 mL via INTRADERMAL

## 2020-06-17 MED ORDER — HEPARIN SODIUM (PORCINE) 1000 UNIT/ML IJ SOLN
INTRAMUSCULAR | Status: AC
  Filled 2020-06-17: qty 1

## 2020-06-17 MED ORDER — FENTANYL CITRATE (PF) 100 MCG/2ML IJ SOLN
INTRAMUSCULAR | Status: DC | PRN
  Administered 2020-06-17: 50 ug via INTRAVENOUS

## 2020-06-17 MED ORDER — HYDRALAZINE HCL 20 MG/ML IJ SOLN: 5.0000 mg | INTRAMUSCULAR | Status: DC | PRN

## 2020-06-17 MED ORDER — HEPARIN (PORCINE) IN NACL 1000-0.9 UT/500ML-% IV SOLN
INTRAVENOUS | Status: AC
  Filled 2020-06-17: qty 1000

## 2020-06-17 MED ORDER — NITROGLYCERIN 1 MG/10 ML FOR IR/CATH LAB
INTRA_ARTERIAL | Status: DC | PRN
  Administered 2020-06-17: 200 ug via INTRA_ARTERIAL
  Administered 2020-06-17: 300 ug via INTRA_ARTERIAL

## 2020-06-17 MED ORDER — CLOPIDOGREL BISULFATE 75 MG PO TABS: 75.0000 mg | ORAL_TABLET | Freq: Every day | ORAL | 11 refills | Status: DC

## 2020-06-17 MED ORDER — IOHEXOL 350 MG/ML SOLN
INTRAVENOUS | Status: DC | PRN
  Administered 2020-06-17: 15 mL

## 2020-06-17 MED ORDER — ONDANSETRON HCL 4 MG/2ML IJ SOLN
INTRAMUSCULAR | Status: DC | PRN
  Administered 2020-06-17: 4 mg via INTRAVENOUS

## 2020-06-17 MED ORDER — SODIUM CHLORIDE 0.9 % WEIGHT BASED INFUSION: 1.0000 mL/kg/h | INTRAVENOUS | Status: DC

## 2020-06-17 MED ORDER — HEPARIN SODIUM (PORCINE) 1000 UNIT/ML IJ SOLN
INTRAMUSCULAR | Status: DC | PRN
  Administered 2020-06-17: 1000 [IU] via INTRAVENOUS
  Administered 2020-06-17: 7000 [IU] via INTRAVENOUS
  Administered 2020-06-17: 2000 [IU] via INTRAVENOUS

## 2020-06-17 MED ORDER — HYDRALAZINE HCL 20 MG/ML IJ SOLN
INTRAMUSCULAR | Status: AC
  Filled 2020-06-17: qty 1

## 2020-06-17 MED ORDER — HEPARIN (PORCINE) IN NACL 1000-0.9 UT/500ML-% IV SOLN
INTRAVENOUS | Status: DC | PRN
  Administered 2020-06-17 (×2): 500 mL

## 2020-06-17 MED ORDER — FENTANYL CITRATE (PF) 100 MCG/2ML IJ SOLN
INTRAMUSCULAR | Status: AC
  Filled 2020-06-17: qty 2

## 2020-06-17 MED ORDER — NITROGLYCERIN 1 MG/10 ML FOR IR/CATH LAB
INTRA_ARTERIAL | Status: AC
  Filled 2020-06-17: qty 10

## 2020-06-17 MED ORDER — HYDRALAZINE HCL 20 MG/ML IJ SOLN
INTRAMUSCULAR | Status: DC | PRN
  Administered 2020-06-17 (×2): 10 mg via INTRAVENOUS

## 2020-06-17 MED ORDER — LABETALOL HCL 5 MG/ML IV SOLN: 10.0000 mg | INTRAVENOUS | Status: DC | PRN

## 2020-06-17 MED ORDER — ACETAMINOPHEN 325 MG PO TABS: 650.0000 mg | ORAL_TABLET | ORAL | Status: DC | PRN

## 2020-06-17 MED ORDER — SODIUM CHLORIDE 0.9 % IV SOLN: INTRAVENOUS | Status: DC

## 2020-06-17 MED ORDER — SODIUM CHLORIDE 0.9 % IV SOLN: 250.0000 mL | INTRAVENOUS | Status: DC | PRN

## 2020-06-17 MED ORDER — IODIXANOL 320 MG/ML IV SOLN
INTRAVENOUS | Status: DC | PRN
  Administered 2020-06-17: 150 mL via INTRA_ARTERIAL

## 2020-06-17 MED ORDER — CLOPIDOGREL BISULFATE 75 MG PO TABS: 75.0000 mg | ORAL_TABLET | Freq: Every day | ORAL | Status: DC

## 2020-06-17 MED FILL — CLOPIDOGREL 75 MG TABLET: 75 | 30 days supply | Qty: 30 | Fill #0 | Status: TO

## 2020-06-17 SURGICAL SUPPLY — 33 items
BALLN CHOCOLATE 3.0X40X150 (BALLOONS) ×3
BALLN MUSTANG 5X150X135 (BALLOONS) ×3
BALLN STERLING OTW 3X40X150 (BALLOONS) ×3
BALLN STERLING OTW 5X220X150 (BALLOONS) ×3
BALLOON CHOCOLATE 3.0X40X150 (BALLOONS) ×2 IMPLANT
BALLOON MUSTANG 5X150X135 (BALLOONS) ×2 IMPLANT
BALLOON STERLING OTW 3X40X150 (BALLOONS) ×2 IMPLANT
BALLOON STERLING OTW 5X220X150 (BALLOONS) ×2 IMPLANT
CATH CXI 2.3F 150 ANG (CATHETERS) ×6 IMPLANT
CATH OMNI FLUSH 5F 65CM (CATHETERS) ×3 IMPLANT
CATH QUICKCROSS .018X135CM (MICROCATHETER) ×3 IMPLANT
CATH QUICKCROSS SUPP .035X90CM (MICROCATHETER) ×3 IMPLANT
CLOSURE PERCLOSE PROSTYLE (VASCULAR PRODUCTS) ×3 IMPLANT
DCB RANGER 4.0X40 135 (BALLOONS) ×2 IMPLANT
GUIDEWIRE ZILIENT 30G 018 (WIRE) ×3 IMPLANT
KIT ENCORE 26 ADVANTAGE (KITS) ×3 IMPLANT
KIT MICROPUNCTURE NIT STIFF (SHEATH) ×3 IMPLANT
KIT PV (KITS) ×3 IMPLANT
RANGER DCB 4.0X40 135 (BALLOONS) ×3
SHEATH FLEX ANSEL ANG 6F 45CM (SHEATH) ×3 IMPLANT
SHEATH PINNACLE 5F 10CM (SHEATH) ×3 IMPLANT
SHEATH PROBE COVER 6X72 (BAG) ×3 IMPLANT
STENT ELUVIA 6X120X130 (Permanent Stent) ×9 IMPLANT
STENT SYNERGY XD 3.50X28 (Permanent Stent) ×2 IMPLANT
STOPCOCK MORSE 400PSI 3WAY (MISCELLANEOUS) ×3 IMPLANT
SYNERGY XD 3.50X28 (Permanent Stent) ×3 IMPLANT
SYR MEDRAD MARK 7 150ML (SYRINGE) ×3 IMPLANT
TRANSDUCER W/STOPCOCK (MISCELLANEOUS) ×3 IMPLANT
TRAY PV CATH (CUSTOM PROCEDURE TRAY) ×3 IMPLANT
TUBING CIL FLEX 10 FLL-RA (TUBING) ×3 IMPLANT
WIRE G V18X300CM (WIRE) ×3 IMPLANT
WIRE STARTER BENTSON 035X150 (WIRE) ×3 IMPLANT
WIRE ZILIENT 014 4G (WIRE) ×3 IMPLANT

## 2020-06-17 NOTE — Discharge Instructions (Signed)
   HOLD METFORMIN FOR A FULL 48 HOURS AFTER DISCHARGE.  Femoral Site Care This sheet gives you information about how to care for yourself after your procedure. Your health care provider may also give you more specific instructions. If you have problems or questions, contact your health care provider. What can I expect after the procedure? After the procedure, it is common to have:  Bruising that usually fades within 1-2 weeks.  Tenderness at the site. Follow these instructions at home: Wound care 1. May remove bandage after 24 hours. 2. Do not take baths, swim, or use a hot tub for 5 days. 3. You may shower 24-48 hours after the procedure. ? Gently wash the site with plain soap and water. ? Pat the area dry with a clean towel. ? Do not rub the site. This may cause bleeding. 4. Do not apply powder or lotion to the site. Keep the site clean and dry. 5. Check your femoral site every day for signs of infection. Check for: ? Redness, swelling, or pain. ? Fluid or blood. ? Warmth. ? Pus or a bad smell. Activity 1. For the first 2-3 days after your procedure, or as long as directed: ? Avoid climbing stairs as much as possible. ? Do not squat. 2. Do not lift, push or pull anything that is heavier than 10 lb for 5 days. 3. Rest as directed. ? Avoid sitting for a long time without moving. Get up to take short walks every 1-2 hours. 4. Do not drive for 24 hours. General instructions  Take over-the-counter and prescription medicines only as told by your health care provider.  Keep all follow-up visits as told by your health care provider. This is important.  DRINK PLENTY OF FLUIDS FOR THE NEXT 2-3 DAYS. Contact a health care provider if you have:  A fever or chills.  You have redness, swelling, or pain around your insertion site. Get help right away if:  The catheter insertion area swells very fast.  You pass out.  You suddenly start to sweat or your skin gets clammy.  The  catheter insertion area is bleeding, and the bleeding does not stop when you hold steady pressure on the area.  The area near or just beyond the catheter insertion site becomes pale, cool, tingly, or numb. These symptoms may represent a serious problem that is an emergency. Do not wait to see if the symptoms will go away. Get medical help right away. Call your local emergency services (911 in the [place] Do not drive yourself to the hospital. Summary  After the procedure, it is common to have bruising that usually fades within 1-2 weeks.  Check your femoral site every day for signs of infection.  Do not lift, push or pull anything that is heavier than 10 lb for 5 days.  This information is not intended to replace advice given to you by your health care provider. Make sure you discuss any questions you have with your health care provider. Document Revised: 05/01/2017 Document Reviewed: 05/01/2017 Elsevier Patient Education  2020 [employer]vier Inc[employer] 

## 2020-06-17 NOTE — Interval H&P Note (Signed)
History and Physical Interval Note:  06/17/2020 9:43 AM  Rebecca [name]  has presented today for surgery, with the diagnosis of pad w/ rest pain.  The various methods of treatment have been discussed with the patient and family. After consideration of risks, benefits and other options for treatment, the patient has consented to  Procedure(s): ABDOMINAL AORTOGRAM W/LOWER EXTREMITY (N/A) as a surgical intervention.  The patient's history has been reviewed, patient examined, no change in status, stable for surgery.  I have reviewed the patient's chart and labs.  Questions were answered to the patient's satisfaction.     [name]

## 2020-06-17 NOTE — Op Note (Signed)
DATE OF SERVICE: 06/17/2020  PATIENT:  Rebecca [name]  65 y.o. female  PRE-OPERATIVE DIAGNOSIS:  Atherosclerosis of native arteries of right lower extremity causing rest pain  POST-OPERATIVE DIAGNOSIS:  Same  PROCEDURE:   1) US guided LCFA access 2) Aortogram 3) RLE angiogram with third order cannulation ( total contrast) 4) Origin of SFA to mid-popliteal stenting (6x163mm Eluvia x 3) 5) TP trunk / Peroneal stenting (3.5 x 2mm) 6) Intraarterial injection of nitroglycerin ( total)  SURGEON:  Surgeon(s) and Role:    * [name], MD - Primary  ASSISTANT: none  ANESTHESIA:   local  EBL: min  BLOOD ADMINISTERED:none  DRAINS: none   LOCAL MEDICATIONS USED:  LIDOCAINE   SPECIMEN:  none  COUNTS: confirmed correct.  TOURNIQUET:  None  PATIENT DISPOSITION:  PACU - hemodynamically stable.   Delay start of Pharmacological VTE agent (>24hrs) due to surgical blood loss or risk of bleeding: no  INDICATION FOR PROCEDURE: Rebecca [name] is a 65 y.o. female with ischemic rest pain of the right lower extremity. After careful discussion of risks, benefits, and alternatives the patient was offered angiography with intervention. We specifically discussed access site complications. The patient understood and wished to proceed.  OPERATIVE FINDINGS:  Diffuse disease in terminal aorta and bilateral iliac vessels Patent L CFA - popliteal bypass L CFA / profunda unremarkable Peroneal only runoff on L R CFA / profunda unremarkable R SFA and popliteal artery diffusely diseased - greatest stenosis 90%. Short occlusion of TP trunk / origin of peroneal artery Peroneal only runoff to foot on right Disadvantaged pedal outflow Successful recanalization of CTO and SFA/pop revascularization Poor flow out of peroneal into foot. Improved with nitroglycerin administration  Start DAPT (ASA + Plavix). Continue Statin. Follow up in 4 weeks with ABI / arterial duplex. Sooner if foot  pain persists.  DESCRIPTION OF PROCEDURE: After identification of the patient in the pre-operative holding area, the patient was transferred to the operating room. The patient was positioned supine on the operating room table. Anesthesia was induced. The groins was prepped and draped in standard fashion. A surgical pause was performed confirming correct patient, procedure, and operative location.  The left groin was anesthetized with subcutaneous injection of 1% lidocaine. Using ultrasound guidance, the left common femoral artery was accessed with micropuncture technique. Fluoroscopy was used to confirm cannulation over the femoral head. Sheathogram was not performed. The 24F sheath was upsized to 47F.   An 035 glidewire advantage was advanced into the distal aorta. Over the wire an omni flush catheter was advanced to the level of L2. Aortogram was performed - see above for details.   The right common iliac artery was selected with the 035 glidewire advantage. The wire was advanced into the superficial femoral artery. Over the wire the omni flush catheter was advanced into the external iliac artery. Selective angiography was performed - see above for details.   The decision was made to intervene. The patient was heparinized with 9000 units of heparin. The sheath was exchanged for a 36F x 45cm sheath. Selective angiography of the left lower extremity was performed prior to intervention. The lesions were treated with:  SFA and popliteal stenting (6x170mm Eluvia x 3) TP trunk / peroneal stenting (3.5 x 45mm coronary stent)  Completion angiography revealed:  Disadvantaged runoff to the foot. Peroneal patent through its course Peroneal collaterals to foot with poor flow on initial completion Significant improvement with nitroglycerin administration  A perclose device was used to  close the arteriotomy. Hemostasis was excellent upon completion.  Upon completion of the case instrument and sharps counts  were confirmed correct. The patient was transferred to the PACU in good condition. I was present for all portions of the procedure.  Rande Brunt. Lenell Antu, MD [facility] Phone Number: [phone] 06/17/2020 12:34 PM

## 2020-06-17 NOTE — Progress Notes (Signed)
Inpatient Diabetes Program Recommendations  AACE/ADA: New Consensus Statement on Inpatient Glycemic Control (2015)  Target Ranges:  Prepandial:   less than 140 mg/dL      Peak postprandial:   less than 180 mg/dL (1-2 hours)      Critically ill patients:  140 - 180 mg/dL   Lab Results  Component Value Date   GLUCAP 337 (H) 06/17/2020   HGBA1C 10.8 (A) 11/15/2019   HGBA1C 10.8 11/15/2019   HGBA1C 10.8 (A) 11/15/2019   HGBA1C 10.8 (A) 11/15/2019    Diabetes history: DM 2 Outpatient Diabetes medications: Glyburide 10 mg Daily, Metformin 1000 mg BID, Lantus 10 units QHS Current orders for Inpatient glycemic control: In cath lab  Inpatient Diabetes Program Recommendations:    Consider obtaining another A1c level as previous from 10/2019. Of note, patient is followed by [facility] Patient care center and was supposed to start LAntus at previous appointment on 12/26/19.   Given CBG on presentation would anticipate the need for insulin following procedure, consider: -Lantus 10 units Q24H -Novolog 0-15 units TID & HS (if to have diet order)  Thanks, Lujean Rave, MSN, RNC-OB Diabetes Coordinator [phone] (8a-5p)

## 2020-06-18 ENCOUNTER — Encounter (HOSPITAL_COMMUNITY): Payer: Self-pay | Admitting: Vascular Surgery

## 2020-06-19 ENCOUNTER — Telehealth (HOSPITAL_COMMUNITY): Payer: Self-pay

## 2020-06-19 NOTE — Telephone Encounter (Signed)
Pharmacy Transitions of Care Follow-up Telephone Call  Date of discharge: 06/17/20 Discharge Diagnosis: abdominal aortogram  How have you been since you were released from the hospital? Patient has been well. She says she's ben in some pain since the surgery but it's getting better. Knows s/sx of bleeding to look out for.  Medication changes made at discharge: yes   Medication changes obtained and verified? yes    Medication Accessibility:  Home Pharmacy: [facility] on [address]  Was the patient provided with refills on discharged medications? yes  Have all prescriptions been transferred from [facility] to home pharmacy? yes  . Is the patient able to afford medications? yes    Medication Review:   CLOPIDOGREL (PLAVIX) Clopidogrel 75 mg once daily.  - Educated patient on expected duration of therapy of 1 year with clopidogrel. Advised patient that aspirin will be continued indefinitely.  - Advised patient of medications to avoid (NSAIDs, ASA)  - Educated that Tylenol (acetaminophen) will be the preferred analgesic to prevent risk of bleeding  - Emphasized importance of monitoring for signs and symptoms of bleeding (abnormal bruising, prolonged bleeding, nose bleeds, bleeding from gums, discolored urine, black tarry stools)  - Advised patient to alert all providers of anticoagulation therapy prior to starting a new medication or having a procedure   Follow-up Appointments:  Has f/u with Cardiolody and Vascular Surgery on 07/13/20   If their condition worsens, is the pt aware to call PCP or go to the Emergency Dept.? yes  Final Patient Assessment: Patient is well, has refills at home pharmacy and follow ups scheduled

## 2020-06-30 ENCOUNTER — Other Ambulatory Visit: Payer: Self-pay | Admitting: Nurse Practitioner

## 2020-06-30 DIAGNOSIS — E1165 Type 2 diabetes mellitus with hyperglycemia: Secondary | ICD-10-CM

## 2020-06-30 DIAGNOSIS — I10 Essential (primary) hypertension: Secondary | ICD-10-CM

## 2020-06-30 DIAGNOSIS — IMO0002 Reserved for concepts with insufficient information to code with codable children: Secondary | ICD-10-CM

## 2020-07-08 ENCOUNTER — Other Ambulatory Visit: Payer: Self-pay | Admitting: *Deleted

## 2020-07-08 DIAGNOSIS — I739 Peripheral vascular disease, unspecified: Secondary | ICD-10-CM

## 2020-07-08 DIAGNOSIS — I70221 Atherosclerosis of native arteries of extremities with rest pain, right leg: Secondary | ICD-10-CM

## 2020-07-13 ENCOUNTER — Ambulatory Visit (INDEPENDENT_AMBULATORY_CARE_PROVIDER_SITE_OTHER): Payer: Self-pay | Admitting: Physician Assistant

## 2020-07-13 ENCOUNTER — Ambulatory Visit (HOSPITAL_COMMUNITY)
Admission: RE | Admit: 2020-07-13 | Discharge: 2020-07-13 | Disposition: A | Discharge status: Home / Self Care | Payer: Self-pay | Source: Ambulatory Visit | Attending: Surgery | Admitting: Surgery

## 2020-07-13 ENCOUNTER — Ambulatory Visit (HOSPITAL_COMMUNITY)
Admission: RE | Admit: 2020-07-13 | Discharge: 2020-07-13 | Disposition: A | Discharge status: Home / Self Care | Payer: HRSA Program | Source: Ambulatory Visit | Attending: Surgery | Admitting: Surgery

## 2020-07-13 ENCOUNTER — Other Ambulatory Visit: Payer: Self-pay

## 2020-07-13 ENCOUNTER — Encounter: Payer: Self-pay | Admitting: Physician Assistant

## 2020-07-13 VITALS — BP 133/68 | HR 61 | Temp 98.3°F | Resp 20 | Ht 64.0 in | Wt 156.5 lb

## 2020-07-13 DIAGNOSIS — I70221 Atherosclerosis of native arteries of extremities with rest pain, right leg: Secondary | ICD-10-CM | POA: Diagnosis not present

## 2020-07-13 DIAGNOSIS — I739 Peripheral vascular disease, unspecified: Secondary | ICD-10-CM

## 2020-07-13 NOTE — Progress Notes (Signed)
History of Present Illness:  Patient is a 65 y.o. year old female who presents for evaluation of PAD.  She presented with ischemic rest pain of the right lower extremity.  Now s/p Origin of SFA to mid-popliteal stenting and TP trunk / Peroneal stenting, followed with Intraarterial injection of nitroglycerin.    She states she no longer has rest pain.  She still has minimal bloody drainage in the left groin at the stick site.  She denise swelling, fever and chills.  Past Medical History:  Diagnosis Date  . CAD (coronary artery disease)   . Diabetes mellitus without complication (HCC)   . DM (diabetes mellitus), type 2, uncontrolled with complications (HCC) 04/06/2012  . Hypertension   . Hypertensive emergency 11/14/2012  . Hypokalemia 11/14/2012  . Hyponatremia 04/08/2012  . Mixed hyperlipidemia 04/06/2012  . NSTEMI (non-ST elevated myocardial infarction) (HCC) 11/14/2012  . Tobacco abuse 04/06/2012    Past Surgical History:  Procedure Laterality Date  . ABDOMINAL AORTOGRAM W/LOWER EXTREMITY N/A 06/17/2020   Procedure: ABDOMINAL AORTOGRAM W/LOWER EXTREMITY;  Surgeon: [name], MD;  Location: [facility];  Service: Cardiovascular;  Laterality: N/A;  . FEMORAL-POPLITEAL BYPASS GRAFT Left 09/20/2018   Procedure: Left  FEMORAL-BELOW KNEE POPLITEAL ARTERY Bypass with Translocated nonreversed saphenous vein;  Surgeon: Larina Earthly, MD;  Location: [facility] OR;  Service: Vascular;  Laterality: Left;  . LEFT HEART CATHETERIZATION WITH CORONARY ANGIOGRAM N/A 11/14/2012   Procedure: LEFT HEART CATHETERIZATION WITH CORONARY ANGIOGRAM;  Surgeon: Pamella Pert, MD;  Location: [facility] CATH LAB;  Service: Cardiovascular;  Laterality: N/A;  . LOWER EXTREMITY ANGIOGRAPHY N/A 09/05/2018   Procedure: LOWER EXTREMITY ANGIOGRAPHY;  Surgeon: Nada Libman, MD;  Location: [facility];  Service: Cardiovascular;  Laterality: N/A;  . PERCUTANEOUS CORONARY STENT INTERVENTION (PCI-S)  11/14/2012    Procedure: PERCUTANEOUS CORONARY STENT INTERVENTION (PCI-S);  Surgeon: Pamella Pert, MD;  Location: [facility] CATH LAB;  Service: Cardiovascular;;  . PERIPHERAL VASCULAR INTERVENTION Right 06/17/2020   Procedure: PERIPHERAL VASCULAR INTERVENTION;  Surgeon: [name], MD;  Location: [facility];  Service: Cardiovascular;  Laterality: Right;  Femoral/Popliteal   . TONSILLECTOMY      ROS:   General:  No weight loss, Fever, chills  HEENT: No recent headaches, no nasal bleeding, no visual changes, no sore throat  Neurologic: No dizziness, blackouts, seizures. No recent symptoms of stroke or mini- stroke. No recent episodes of slurred speech, or temporary blindness.  Cardiac: No recent episodes of chest pain/pressure, no shortness of breath at rest.  No shortness of breath with exertion.  Denies history of atrial fibrillation or irregular heartbeat  Vascular: No history of rest pain in feet.  No history of claudication.  No history of non-healing ulcer, No history of DVT   Pulmonary: No home oxygen, no productive cough, no hemoptysis,  No asthma or wheezing  Musculoskeletal:  [ ]  Arthritis, [ ]  Low back pain,  [ ]  Joint pain  Hematologic:No history of hypercoagulable state.  No history of easy bleeding.  No history of anemia  Gastrointestinal: No hematochezia or melena,  No gastroesophageal reflux, no trouble swallowing  Urinary: [ ]  chronic Kidney disease, [ ]  on HD - [ ]  MWF or [ ]  TTHS, [ ]  Burning with urination, [ ]  Frequent urination, [ ]  Difficulty urinating;   Skin: No rashes  Psychological: No history of anxiety,  No history of depression  Social History Social History   Tobacco Use  .  Smoking status: Former Smoker    Packs/day: 1.00    Types: Cigarettes  . Smokeless tobacco: Never Used  Vaping Use  . Vaping Use: Never used  Substance Use Topics  . Alcohol use: Yes    Comment: occ  . Drug use: No    Family History Family History  Problem Relation Age of  Onset  . Diabetes Brother   . Heart disease Brother   . Arthritis Brother   . Diabetes Father   . Heart disease Father     Allergies  Allergies  Allergen Reactions  . Penicillins Rash and Other (See Comments)    Did it involve swelling of the face/tongue/throat, SOB, or low BP? No Did it involve sudden or SEVERE rash/hives, skin peeling, or any reaction on the inside of your mouth or nose? #  #  #  YES  #  #  #  Did you need to seek medical attention at a hospital or doctor's office? #  #  #  YES  #  #  #  When did it last happen? Unknown     Current Outpatient Medications  Medication Sig Dispense Refill  . amLODipine (NORVASC) 10 MG tablet Take 1 tablet (10 mg total) by mouth daily. 90 tablet 1  . aspirin 81 MG chewable tablet Chew 81 mg by mouth daily.    Marland Kitchen. atorvastatin (LIPITOR) 80 MG tablet Take 1 tablet (80 mg total) by mouth daily at 6 PM. 90 tablet 3  . cholecalciferol (VITAMIN D3) 25 MCG (1000 UNIT) tablet Take 1,000 Units by mouth daily.    . clopidogrel (PLAVIX) 75 MG tablet Take 1 tablet (75 mg total) by mouth daily with breakfast. 30 tablet 11  . glyBURIDE (DIABETA) 5 MG tablet TAKE 2 TABLETS BY MOUTH ONCE DAILY WITH BREAKFAST 60 tablet 0  . hydrochlorothiazide (HYDRODIURIL) 25 MG tablet Take 1 tablet (25 mg total) by mouth daily. 90 tablet 3  . KRILL OIL 1000 MG CAPS Take 1,000 mg by mouth daily.     Marland Kitchen. lisinopril (ZESTRIL) 40 MG tablet Take 1 tablet (40 mg total) by mouth daily. 90 tablet 3  . MAGNESIUM PO Take 1 tablet by mouth daily.    . metFORMIN (GLUCOPHAGE) 1000 MG tablet Take 1 tablet (1,000 mg total) by mouth 2 (two) times daily with a meal. 180 tablet 3  . metoprolol tartrate (LOPRESSOR) 25 MG tablet Take 1 tablet by mouth twice daily 60 tablet 0  . insulin glargine (LANTUS) 100 UNIT/ML injection Inject 0.1 mLs (10 Units total) into the skin at bedtime. (Patient not taking: Reported on 07/13/2020) 10 mL 11  . Insulin Syringe-Needle U-100 27G X 1/2" 0.5 ML MISC  10 Units by Does not apply route at bedtime. (Patient not taking: Reported on 07/13/2020) 100 each 2   No current facility-administered medications for this visit.    Physical Examination  Vitals:   07/13/20 0849  BP: 133/68  Pulse: 61  Resp: 20  Temp: 98.3 F (36.8 C)  TempSrc: Temporal  SpO2: 100%  Weight: 156 lb 8 oz (71 kg)  Height: 5\' 4"  (1.626 m)    Body mass index is 26.86 kg/m.  General:  Alert and oriented, no acute distress HEENT: Normal Neck: No bruit or JVD Pulmonary: Clear to auscultation bilaterally Cardiac: Regular Rate and Rhythm without murmur Abdomen: Soft, non-tender, non-distended, no mass, no scars Skin: No rash, left groin minimal skin edge bloody drainage.  No erythema or hematoma in the left groin.  Extremity Pulses:  2+ radial, brachial, femoral, doppler signal dorsalis pedis, posterior tibial pulses bilaterally Musculoskeletal: No deformity or edema  Neurologic: Upper and lower extremity motor 5/5 and symmetric  DATA:  ABI Findings:  +---------+------------------+-----+----------+--------+  Right  Rt Pressure (mmHg)IndexWaveform Comment   +---------+------------------+-----+----------+--------+  Brachial 169                      +---------+------------------+-----+----------+--------+  PTA   173        0.97 monophasic      +---------+------------------+-----+----------+--------+  DP    174        0.98 monophasic      +---------+------------------+-----+----------+--------+  Great Toe89        0.50 Abnormal       +---------+------------------+-----+----------+--------+   +---------+------------------+-----+----------+--------------+  Left   Lt Pressure (mmHg)IndexWaveform Comment      +---------+------------------+-----+----------+--------------+  Brachial 178                          +---------+------------------+-----+----------+--------------+  PTA                    barely audible  +---------+------------------+-----+----------+--------------+  DP    171        0.96 monophasic         +---------+------------------+-----+----------+--------------+  Great Toe73        0.41 Abnormal          +---------+------------------+-----+----------+--------------+   +-------+-----------+-----------+------------+------------+  ABI/TBIToday's ABIToday's TBIPrevious ABIPrevious TBI  +-------+-----------+-----------+------------+------------+  Right 0.98    0.50    0.29    absent     +-------+-----------+-----------+------------+------------+  Left  0.96    0.41    0.87    0.31      +-------+-----------+-----------+------------+------------+      Right ABIs appear increased. Left ABIs appear increased compared to prior  study on 06/09/2020.    Summary:  Right: Resting right ankle-brachial index is within normal range. No  evidence of significant right lower extremity arterial disease. The right  toe-brachial index is abnormal.   Left: Resting left ankle-brachial index is within normal range. No  evidence of significant left lower extremity arterial disease. The left  toe-brachial index is abnormal.       Right Stent(s):SFA  +---------------+---+---------------+--------+----------------+  Prox to Stent 160        biphasic          +---------------+---+---------------+--------+----------------+  Proximal Stent 20350-99% stenosisbiphasiclow end of range  +---------------+---+---------------+--------+----------------+  Mid Stent   62         biphasic          +---------------+---+---------------+--------+----------------+  Distal Stent  52         biphasic           +---------------+---+---------------+--------+----------------+  Distal to Stent136        biphasic          +---------------+---+---------------+--------+----------------+             popliteal Stent(s):  +---------------+---++--------++  Prox to Stent 136biphasic  +---------------+---++--------++  Proximal Stent 146biphasic  +---------------+---++--------++  Mid Stent   125biphasic  +---------------+---++--------++  Distal Stent  135biphasic  +---------------+---++--------++  Distal to Stent122biphasic  +---------------+---++--------++    Summary:  Right: Patent superficial femoral artery stent with proximal stent  velocities indicative of a 50-99% stenosis.  Widely patent popliteal artery stent with no hemodynamically significant  stenosis.   ASSESSMENT:  PAD with recent history of angio and intervention SFA/peroneal stenting Her rest pain has gone and she  states she has no pain in the right LE. She denise symptoms in the left LE.  Right: Patent superficial femoral artery stent with proximal stent  velocities indicative of a 50-99% stenosis.  Widely patent popliteal artery stent with no hemodynamically significant  stenosis.   Left groin stick site minimal skin edge bleeding.  Applied silver nitrate to skin edge  and covered with 2 x 2 dressing.  Stable stick site.    PLAN: Continue walking program as she tolerates.  Dry dressing to groin until no drainage.  F/U in 3 months for repeat studies B LE.  Cont. ASA, Plavix and Lipitor daily.   Mosetta Pigeon PA-C [facility] Office: [phone]  MD in clinic [place]

## 2020-07-16 ENCOUNTER — Other Ambulatory Visit: Payer: Self-pay

## 2020-07-16 DIAGNOSIS — I739 Peripheral vascular disease, unspecified: Secondary | ICD-10-CM

## 2020-07-18 ENCOUNTER — Other Ambulatory Visit: Payer: Self-pay | Admitting: Nurse Practitioner

## 2020-07-18 DIAGNOSIS — I10 Essential (primary) hypertension: Secondary | ICD-10-CM

## 2020-07-21 ENCOUNTER — Telehealth: Payer: Self-pay

## 2020-07-21 NOTE — Telephone Encounter (Signed)
Have attempted x2 calls on 3/21 & 3/22 to reach regarding groin drainage. Left VM for patient to return call.

## 2020-07-23 NOTE — Telephone Encounter (Signed)
Patient returned call today. She reports a few drops of serosanguinous drainage over the course of the day. Denies any other issues. Asked patient to call office next week and advised to call back sooner if drainage increased.

## 2020-09-28 IMAGING — CT CT ANGIOGRAPHY AOBIFEM WITHOUT AND WITH CONTRAST
1 of 7 series · 5 of 16 positions shown, 7 images · IV contrast (OMNI 350)
Comparison: Prior CT scan of the chest 04/07/2012

CLINICAL DATA: 62-year-old female with left foot pain and decreased
pulses.

EXAM:
CT ANGIOGRAPHY OF ABDOMINAL AORTA WITH ILIOFEMORAL RUNOFF
TECHNIQUE: Multidetector CT imaging of the abdomen, pelvis and lower
extremities was performed using the standard protocol during bolus
administration of intravenous contrast. Multiplanar CT image
reconstructions and MIPs were obtained to evaluate the vascular
anatomy.
CONTRAST:  125mL OMNIPAQUE IOHEXOL 350 MG/ML SOLN

[Series 5: cta runoff (id) · axial · 0.91mm/px · z∈[+137,+1013]mm · 5 of 438 slices shown, 7 images]
[im 73/438  soft-tissue]
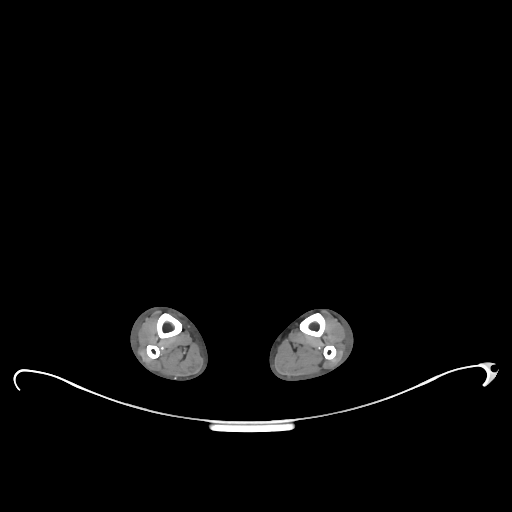
[im 73/438  bone]
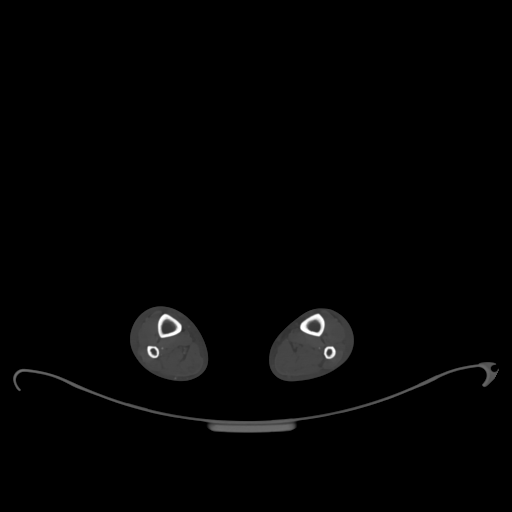
[im 146/438  soft-tissue]
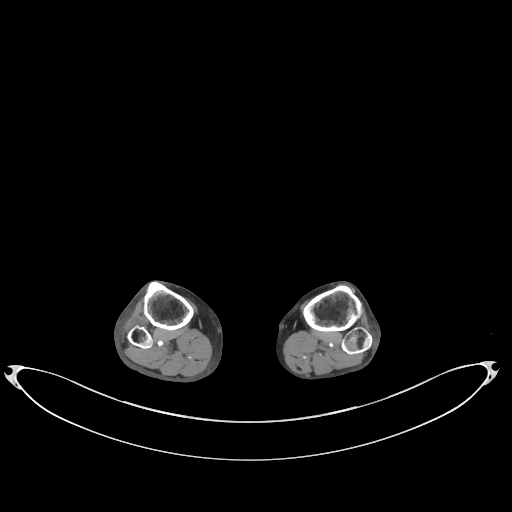
[im 219/438  soft-tissue]
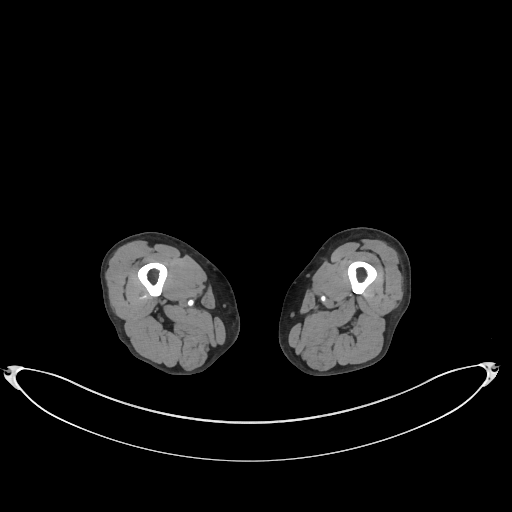
[im 292/438  soft-tissue]
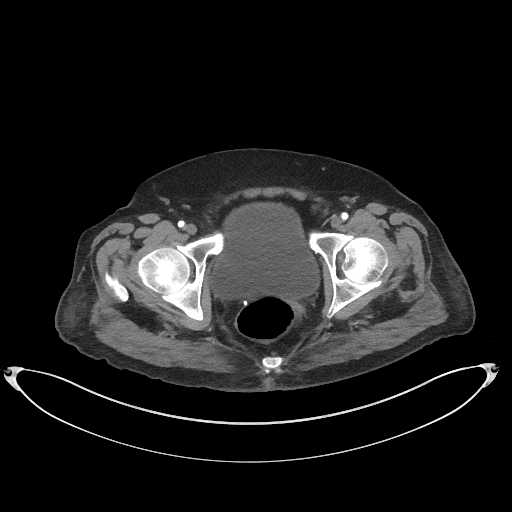
[im 365/438  soft-tissue]
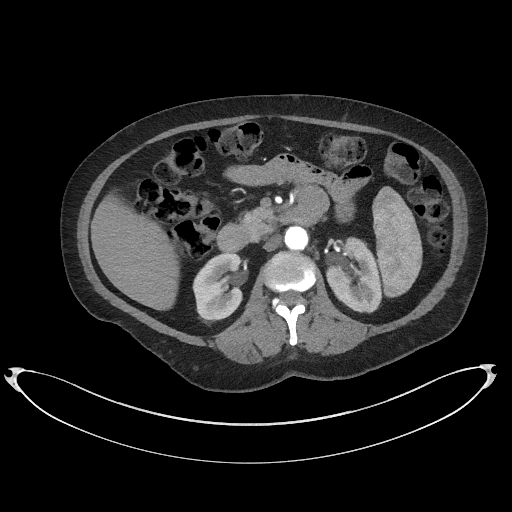
[im 365/438  bone]
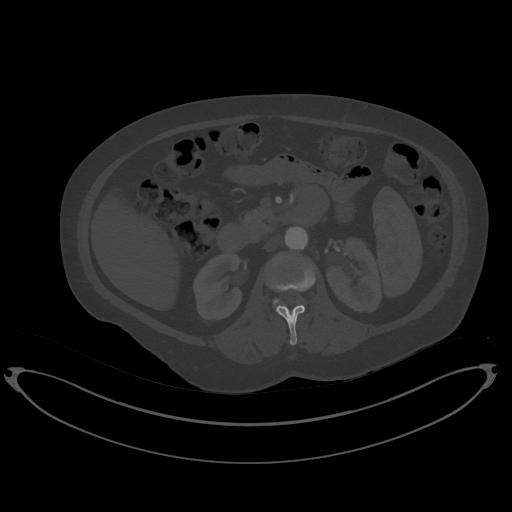

[5 of 16 positions shown; findings below may reference images not displayed]

FINDINGS: VASCULAR

Aorta: Heterogeneous atherosclerotic plaque visualized along the
abdominal aorta. No evidence of aneurysm or dissection.

Celiac: Patent without evidence of aneurysm, dissection, vasculitis
or significant stenosis.

SMA: Patent without evidence of aneurysm, dissection, vasculitis or
significant stenosis.

Renals: Both renal arteries are patent without evidence of aneurysm,
dissection, vasculitis, fibromuscular dysplasia or significant
stenosis.

IMA: Patent without evidence of aneurysm, dissection, vasculitis or
significant stenosis.

RIGHT Lower Extremity

Inflow: Focal short segment non flow limiting dissection in the
distal right common iliac artery. Heterogeneous fibrofatty plaque in
the proximal common iliac artery results in mild stenosis. The
internal iliac artery is patent. The external iliac artery is
relatively spared from disease.

Outflow: Mild fibrofatty atherosclerotic plaque along the posterior
common femoral artery without focal stenosis. The profunda femoral
artery is widely patent. The superficial femoral artery is mildly
diseased but remains patent without significant stenosis. There is a
moderate focal stenosis in the P1 segment of the popliteal artery.

Runoff: Variant runoff anatomy. No definite anterior tibial artery
is visualized. It may be congenitally hypoplastic as there appears
to be peroneal continuation. The posterior tibial artery occludes in
the upper calf. The dorsalis pedis and common plantar arteries are
patent via collateral flow.

LEFT Lower Extremity

Inflow: Heterogeneous and irregular atherosclerotic plaque with
small penetrating atherosclerotic ulcers in the proximal common
iliac artery. No focal stenosis. The internal iliac artery is
chronically occluded. The external iliac artery is spared from
disease.

Outflow: Widely patent common femoral and profunda femoral arteries.
The superficial femoral artery is diffusely diseased with
predominantly fibrofatty atherosclerotic plaque. There is a focal
high-grade stenosis in the upper thigh with a second focal
high-grade stenosis in the mid thigh. The artery then abruptly
occludes at the abductor canal. There is a hypertrophic collateral
suggesting that this is not an acute process. The artery remains
occluded into the P1 segment of the popliteal artery where it
reconstitutes. The popliteal artery is then diffusely disease, again
with predominantly fibrofatty atherosclerotic plaque.

Runoff: Similar anatomy without a true anterior tibial artery. There
is peroneal continuation with single-vessel runoff to the ankle.

Veins: No focal venous abnormality.

Review of the MIP images confirms the above findings.

NON-VASCULAR

Lower chest: No acute abnormality. Incompletely imaged cardiomegaly.
Calcifications along the coronary arteries. No pericardial effusion.
Unremarkable distal thoracic esophagus.

Hepatobiliary: Normal hepatic contour and morphology. No discrete
hepatic lesion. Stones are present within the gallbladder consistent
with cholelithiasis. No intra or extrahepatic biliary ductal
dilatation.

Pancreas: Unremarkable. No pancreatic ductal dilatation or
surrounding inflammatory changes.

Spleen: Normal in size without focal abnormality.

Adrenals/Urinary Tract: Normal adrenal glands. No evidence of
hydronephrosis, nephrolithiasis or enhancing renal mass. The ureters
and bladder are unremarkable.

Stomach/Bowel: No evidence of obstruction or focal bowel wall
thickening. Normal appendix in the right lower quadrant. The
terminal ileum is unremarkable.

Lymphatic: No suspicious lymphadenopathy.

Reproductive: Uterus and bilateral adnexa are unremarkable.

Other: No abdominal wall hernia or abnormality. No abdominopelvic
ascites.

Musculoskeletal: No acute fracture or aggressive appearing lytic or
blastic osseous lesion.
IMPRESSION: VASCULAR

1. Multifocal left lower extremity peripheral arterial disease as
detailed above. In short, there are at least 2 focal moderate to
high-grade stenoses in the left superficial femoral artery as well
as a chronic or subacute 7 cm long occlusion in the distal
superficial femoral artery extending into the P1 segment of the
popliteal artery. This does not appear to be embolic in nature.
2. Variant runoff anatomy bilaterally with congenitally hypoplastic
anterior tibial arteries and peroneal continuation. Additionally,
the posterior tibial arteries appear congenitally hypoplastic and
are occluded bilaterally. Thus, the patient has single-vessel runoff
to the ankles in the form of the peroneal artery bilaterally.
3. Non flow limiting short segment chronic dissection in the right
common iliac artery.
4. Small penetrating atherosclerotic ulcers in the left common iliac
artery.
5. Chronic occlusion of the left internal iliac artery.
6.  Aortic Atherosclerosis ([id number]-[id number]).
7. Coronary artery disease.

NON-VASCULAR

1. No acute abnormality in the abdomen or pelvis.
2. Cholelithiasis.

## 2020-10-12 ENCOUNTER — Other Ambulatory Visit: Payer: Self-pay

## 2020-10-12 ENCOUNTER — Ambulatory Visit (HOSPITAL_COMMUNITY)
Admission: RE | Admit: 2020-10-12 | Discharge: 2020-10-12 | Disposition: A | Discharge status: Home / Self Care | Payer: Self-pay | Source: Ambulatory Visit | Attending: Surgery | Admitting: Surgery

## 2020-10-12 ENCOUNTER — Ambulatory Visit (INDEPENDENT_AMBULATORY_CARE_PROVIDER_SITE_OTHER)
Admission: RE | Admit: 2020-10-12 | Discharge: 2020-10-12 | Disposition: A | Discharge status: Home / Self Care | Payer: Self-pay | Source: Ambulatory Visit | Attending: Physician Assistant | Admitting: Physician Assistant

## 2020-10-12 ENCOUNTER — Ambulatory Visit (INDEPENDENT_AMBULATORY_CARE_PROVIDER_SITE_OTHER): Payer: Self-pay | Admitting: Physician Assistant

## 2020-10-12 VITALS — BP 136/68 | HR 64 | Temp 97.3°F | Ht 64.0 in | Wt 145.6 lb

## 2020-10-12 DIAGNOSIS — I739 Peripheral vascular disease, unspecified: Secondary | ICD-10-CM

## 2020-10-12 NOTE — Progress Notes (Signed)
Office Note     CC:  follow up Requesting Provider:  Barbette Merino, NP  HPI: Rebecca [name] is a 65 y.o. (07/28/1955) female who presents for follow up of peripheral artery disease. She most recently in on 06/17/20 underwent Aortogram, RLE arteriogram with SFA to mid popliteal artery stenting, TPT trunk and peroneal stent with intraarterial injection of nitro by Dr. Lenell Antu. This was performed due to ischemic rest pain of the right lower extremity. She additionally has history of a left femoral to below knee popliteal artery bypass with  translocated non reversed GSV on 09/20/18 by Dr.Early. This was performed secondary to rest pain and tissue loss.   At her follow up in March her symptoms  were resolved in both lower extremities.  She has been maintained on Aspirin, Plavix and Statin. Her duplex at the time showed some stenosis in proximal stents of the RLE so she was scheduled to return for closer interval follow up   She returns today for 3 month follow up with non invasive studies. She denies any limiting claudication, rest pain or tissue loss. She explains that if she is up on her legs all day long walking around at work she gets some cramping / throbbing discomfort in her left calf. This is quickly relieved with rest. She continues to stay on Aspirin, statin and Plavix.   The pt is on a statin for cholesterol management.  The pt is on a daily aspirin.   Other AC:  Plavix The pt is on CCB, BB, ACE, HCTZ for hypertension.   The pt is diabetic.   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/06/2012   Hypertension    Hypertensive emergency 11/14/2012   Hypokalemia 11/14/2012   Hyponatremia 04/08/2012   Mixed hyperlipidemia 04/06/2012   NSTEMI (non-ST elevated myocardial infarction) (HCC) 11/14/2012   Tobacco abuse  04/06/2012    Past Surgical History:  Procedure Laterality Date   ABDOMINAL AORTOGRAM W/LOWER EXTREMITY N/A 06/17/2020   Procedure: ABDOMINAL AORTOGRAM W/LOWER EXTREMITY;  Surgeon: [name], MD;  Location: [facility];  Service: Cardiovascular;  Laterality: N/A;   FEMORAL-POPLITEAL BYPASS GRAFT Left 09/20/2018   Procedure: Left  FEMORAL-BELOW KNEE POPLITEAL ARTERY Bypass with Translocated nonreversed saphenous vein;  Surgeon: Larina Earthly, MD;  Location: [facility] OR;  Service: Vascular;  Laterality: Left;   LEFT HEART CATHETERIZATION WITH CORONARY ANGIOGRAM N/A 11/14/2012   Procedure: LEFT HEART CATHETERIZATION WITH CORONARY ANGIOGRAM;  Surgeon: Pamella Pert, MD;  Location: [facility] CATH LAB;  Service: Cardiovascular;  Laterality: N/A;   LOWER EXTREMITY ANGIOGRAPHY N/A 09/05/2018   Procedure: LOWER EXTREMITY ANGIOGRAPHY;  Surgeon: Nada Libman, MD;  Location: [facility];  Service: Cardiovascular;  Laterality: N/A;   PERCUTANEOUS CORONARY STENT INTERVENTION (PCI-S)  11/14/2012   Procedure: PERCUTANEOUS CORONARY STENT INTERVENTION (PCI-S);  Surgeon: Pamella Pert, MD;  Location: [facility] CATH LAB;  Service: Cardiovascular;;   PERIPHERAL VASCULAR INTERVENTION Right 06/17/2020   Procedure: PERIPHERAL VASCULAR INTERVENTION;  Surgeon: [name], MD;  Location: [facility];  Service: Cardiovascular;  Laterality: Right;  Femoral/Popliteal    TONSILLECTOMY      Social History   Socioeconomic History   Marital status: Single    Spouse name: Not on file   Number of children: Not on file  Years of education: Not on file   Highest education level: Not on file  Occupational History   Not on file  Tobacco Use   Smoking status: Former    Packs/day: 1.00    Pack years: 0.00    Types: Cigarettes    Quit date: 2019    Years since quitting: 3.4   Smokeless tobacco: Never  Vaping Use   Vaping Use: Never used  Substance and Sexual Activity   Alcohol use: Yes    Comment:  occ   Drug use: No   Sexual activity: Not on file  Other Topics Concern   Not on file  Social History Narrative   Not on file   Social Determinants of Health   Financial Resource Strain: Not on file  Food Insecurity: Not on file  Transportation Needs: Not on file  Physical Activity: Not on file  Stress: Not on file  Social Connections: Not on file  Intimate Partner Violence: Not on file    Family History  Problem Relation Age of Onset   Diabetes Brother    Heart disease Brother    Arthritis Brother    Diabetes Father    Heart disease Father     Current Outpatient Medications  Medication Sig Dispense Refill   amLODipine (NORVASC) 10 MG tablet Take 1 tablet by mouth once daily 90 tablet 0   aspirin 81 MG chewable tablet Chew 81 mg by mouth daily.     atorvastatin (LIPITOR) 80 MG tablet Take 1 tablet (80 mg total) by mouth daily at 6 PM. 90 tablet 3   cholecalciferol (VITAMIN D3) 25 MCG (1000 UNIT) tablet Take 1,000 Units by mouth daily.     clopidogrel (PLAVIX) 75 MG tablet Take 1 tablet (75 mg total) by mouth daily with breakfast. 30 tablet 11   glyBURIDE (DIABETA) 5 MG tablet TAKE 2 TABLETS BY MOUTH ONCE DAILY WITH BREAKFAST 60 tablet 0   hydrochlorothiazide (HYDRODIURIL) 25 MG tablet Take 1 tablet (25 mg total) by mouth daily. 90 tablet 3   insulin glargine (LANTUS) 100 UNIT/ML injection Inject 0.1 mLs (10 Units total) into the skin at bedtime. (Patient not taking: Reported on 07/13/2020) 10 mL 11   Insulin Syringe-Needle U-100 27G X 1/2" 0.5 ML MISC 10 Units by Does not apply route at bedtime. (Patient not taking: Reported on 07/13/2020) 100 each 2   KRILL OIL 1000 MG CAPS Take 1,000 mg by mouth daily.      lisinopril (ZESTRIL) 40 MG tablet Take 1 tablet (40 mg total) by mouth daily. 90 tablet 3   MAGNESIUM PO Take 1 tablet by mouth daily.     metFORMIN (GLUCOPHAGE) 1000 MG tablet Take 1 tablet (1,000 mg total) by mouth 2 (two) times daily with a meal. 180 tablet 3    metoprolol tartrate (LOPRESSOR) 25 MG tablet Take 1 tablet by mouth twice daily 60 tablet 0   No current facility-administered medications for this visit.    Allergies  Allergen Reactions   Penicillins Rash and Other (See Comments)    Did it involve swelling of the face/tongue/throat, SOB, or low BP? No Did it involve sudden or SEVERE rash/hives, skin peeling, or any reaction on the inside of your mouth or nose? #  #  #  YES  #  #  #  Did you need to seek medical attention at a hospital or doctor's office? #  #  #  YES  #  #  #  When did it  last happen? Unknown     REVIEW OF SYSTEMS:   [X]  denotes positive finding, [ ]  denotes negative finding Cardiac  Comments:  Chest pain or chest pressure:    Shortness of breath upon exertion:    Short of breath when lying flat:    Irregular heart rhythm:        Vascular    Pain in calf, thigh, or hip brought on by ambulation:    Pain in feet at night that wakes you up from your sleep:     Blood clot in your veins:    Leg swelling:         Pulmonary    Oxygen at home:    Productive cough:     Wheezing:         Neurologic    Sudden weakness in arms or legs:     Sudden numbness in arms or legs:     Sudden onset of difficulty speaking or slurred speech:    Temporary loss of vision in one eye:     Problems with dizziness:         Gastrointestinal    Blood in stool:     Vomited blood:         Genitourinary    Burning when urinating:     Blood in urine:        Psychiatric    Major depression:         Hematologic    Bleeding problems:    Problems with blood clotting too easily:        Skin    Rashes or ulcers:        Constitutional    Fever or chills:      PHYSICAL EXAMINATION:  Vitals:   10/12/20 1450  BP: 136/68  Pulse: 64  Temp: (!) 97.3 F (36.3 C)  TempSrc: Skin  SpO2: 98%  Weight: 145 lb 9.6 oz (66 kg)  Height: 5\' 4"  (1.626 m)    General:  WDWN in NAD; vital signs documented above Gait: Not  observed HENT: WNL, normocephalic Pulmonary: normal non-labored breathing , without Rales, rhonchi,  wheezing Cardiac: regular HR, without  Murmurs without carotid bruit Abdomen: soft, NT, no masses Skin: without rashes Vascular Exam/Pulses:  Right Left  Radial 2+ (normal) 2+ (normal)  Femoral 3+ (hyperdynamic) 3+ (hyperdynamic)  Popliteal Not palpable 2+ (normal)  DP Not palpable 1+ (weak)  PT 2+ (normal) 1+ (weak)   Extremities: without ischemic changes, without Gangrene , without cellulitis; without open wounds;  Musculoskeletal: no muscle wasting or atrophy  Neurologic: A&O X 3;  No focal weakness or paresthesias are detected Psychiatric:  The pt has Normal affect.   Non-Invasive Vascular Imaging:  10/12/20  +-------+-----------+-----------+------------+------------+  ABI/TBIToday's ABIToday's TBIPrevious ABIPrevious TBI  +-------+-----------+-----------+------------+------------+  Right  1.01    0.49      0.98       0.50       +-------+-----------+-----------+------------+------------+  Left   1.01     0.53       0.96       0.41         +-------+-----------+-----------+------------+------------+  Right great toe pressure of 66 mmHg Left great toe pressure of 72 mm Hg     Summary:  Right: Patent right stent with increased velocity at the proximal stent in the 50 - 99% stenosis range, lower end of range. Widely patent popliteal artery stent with no visualized stenosis.   Left: Patent femoral to below knee popliteal  bypass graft with increased velocity 50 - 70% stenosis at the proximal anastomosis, probable lower end of range.    ASSESSMENT/PLAN:: 65 y.o. female here for follow up of peripheral artery disease. In February of this year she underwent Aortogram, RLE arteriogram with SFA to mid popliteal artery stenting, TPT trunk and peroneal stent with intraarterial injection of nitro by Dr. Lenell Antu. Her symptoms in bilateral lower extremities remain resolved.  She does not have claudication, rest pain or tissue loss. - Her ABIs are essentially unchanged from prior study. Her duplex today shows 50-99% stenosis both in proximal stent in RLE and proximal graft in LLE. The stenosis and velocities in both lower extremities has been present and has not changed from prior studies - She will continue on her Aspirin, statin and Plavix - She will follow up in 6 months with repeat BLE arterial duplex and ABI   Graceann Congress, PA-C [facility] [phone]  Clinic MD:  Dr. Myra Gianotti

## 2020-10-13 ENCOUNTER — Other Ambulatory Visit: Payer: Self-pay

## 2020-10-13 DIAGNOSIS — I739 Peripheral vascular disease, unspecified: Secondary | ICD-10-CM

## 2021-01-15 ENCOUNTER — Other Ambulatory Visit (HOSPITAL_COMMUNITY): Payer: Self-pay

## 2021-02-25 ENCOUNTER — Telehealth: Payer: Self-pay | Admitting: *Deleted

## 2021-02-25 ENCOUNTER — Ambulatory Visit (INDEPENDENT_AMBULATORY_CARE_PROVIDER_SITE_OTHER): Payer: HRSA Program | Admitting: Nurse Practitioner

## 2021-02-25 ENCOUNTER — Encounter: Payer: Self-pay | Admitting: Nurse Practitioner

## 2021-02-25 ENCOUNTER — Other Ambulatory Visit: Payer: Self-pay

## 2021-02-25 VITALS — BP 185/76 | HR 73 | Temp 97.6°F | Wt 147.8 lb

## 2021-02-25 DIAGNOSIS — Z006 Encounter for examination for normal comparison and control in clinical research program: Secondary | ICD-10-CM

## 2021-02-25 DIAGNOSIS — Z794 Long term (current) use of insulin: Secondary | ICD-10-CM

## 2021-02-25 DIAGNOSIS — E782 Mixed hyperlipidemia: Secondary | ICD-10-CM

## 2021-02-25 DIAGNOSIS — E1165 Type 2 diabetes mellitus with hyperglycemia: Secondary | ICD-10-CM

## 2021-02-25 DIAGNOSIS — Z0000 Encounter for general adult medical examination without abnormal findings: Secondary | ICD-10-CM

## 2021-02-25 DIAGNOSIS — B029 Zoster without complications: Secondary | ICD-10-CM

## 2021-02-25 DIAGNOSIS — I10 Essential (primary) hypertension: Secondary | ICD-10-CM

## 2021-02-25 LAB — POCT URINALYSIS DIP (CLINITEK)
Bilirubin, UA: NEGATIVE
Blood, UA: NEGATIVE
Glucose, UA: 500 mg/dL — AB
Ketones, POC UA: NEGATIVE mg/dL
Leukocytes, UA: NEGATIVE
Nitrite, UA: NEGATIVE
POC PROTEIN,UA: >=300 — AB
Spec Grav, UA: >=1.03 — AB (ref 1.010–1.025)
Urobilinogen, UA: 0.2 E.U./dL
pH, UA: 5.5 (ref 5.0–8.0)

## 2021-02-25 LAB — POCT GLYCOSYLATED HEMOGLOBIN (HGB A1C)
HbA1c POC (<> result, manual entry): 11.4 % (ref 4.0–5.6)
HbA1c, POC (controlled diabetic range): 11.4 % — AB (ref 0.0–7.0)
HbA1c, POC (prediabetic range): 11.4 % — AB (ref 5.7–6.4)
Hemoglobin A1C: 11.4 % — AB (ref 4.0–5.6)

## 2021-02-25 LAB — GLUCOSE, POCT (MANUAL RESULT ENTRY): POC Glucose: 318 mg/dl — AB (ref 70–99)

## 2021-02-25 MED ORDER — PEN NEEDLES 30G X 8 MM MISC
1.0000 | Freq: Every evening | 3 refills | Status: AC
Start: 2021-02-25 — End: 2022-02-25

## 2021-02-25 MED ORDER — ATORVASTATIN CALCIUM 80 MG PO TABS: 80.0000 mg | ORAL_TABLET | Freq: Every day | ORAL | 3 refills | Status: DC

## 2021-02-25 MED ORDER — VALACYCLOVIR HCL 1 G PO TABS: 1000.0000 mg | ORAL_TABLET | Freq: Three times a day (TID) | ORAL | 0 refills | Status: AC

## 2021-02-25 MED ORDER — LISINOPRIL 40 MG PO TABS: 40.0000 mg | ORAL_TABLET | Freq: Every day | ORAL | 3 refills | Status: DC

## 2021-02-25 MED ORDER — AMLODIPINE BESYLATE 10 MG PO TABS: 10.0000 mg | ORAL_TABLET | Freq: Every day | ORAL | 3 refills | Status: DC

## 2021-02-25 MED ORDER — METOPROLOL TARTRATE 25 MG PO TABS: 25.0000 mg | ORAL_TABLET | Freq: Two times a day (BID) | ORAL | 3 refills | Status: DC

## 2021-02-25 MED ORDER — ASPIRIN 81 MG PO CHEW: 81.0000 mg | CHEWABLE_TABLET | Freq: Every day | ORAL | 3 refills | Status: AC

## 2021-02-25 MED ORDER — GLYBURIDE 5 MG PO TABS: 5.0000 mg | ORAL_TABLET | Freq: Two times a day (BID) | ORAL | 3 refills | Status: DC

## 2021-02-25 MED ORDER — HYDROCHLOROTHIAZIDE 25 MG PO TABS: 25.0000 mg | ORAL_TABLET | Freq: Every day | ORAL | 3 refills | Status: DC

## 2021-02-25 MED ORDER — INSULIN GLARGINE 100 UNIT/ML SOLOSTAR PEN: 20.0000 [IU] | PEN_INJECTOR | Freq: Every day | SUBCUTANEOUS | 11 refills | Status: DC

## 2021-02-25 NOTE — Telephone Encounter (Signed)
I called patient to clarify a question on the [employer]inates -Diabetes Study. We need to know if patient is willing to let [facility] follow her for up to five years.I left message for patient to call us.

## 2021-02-25 NOTE — Patient Instructions (Addendum)
Diabetes Mellitus and Nutrition, Adult When you have diabetes, or diabetes mellitus, it is very important to have healthy eating habits because your blood sugar (glucose) levels are greatly affected by what you eat and drink. Eating healthy foods in the right amounts, at about the same times every day, can help you: Control your blood glucose. Lower your risk of heart disease. Improve your blood pressure. Reach or maintain a healthy weight. What can affect my meal plan? Every person with diabetes is different, and each person has different needs for a meal plan. Your health care provider may recommend that you work with a dietitian to make a meal plan that is best for you. Your meal plan may vary depending on factors such as: The calories you need. The medicines you take. Your weight. Your blood glucose, blood pressure, and cholesterol levels. Your activity level. Other health conditions you have, such as heart or kidney disease. How do carbohydrates affect me? Carbohydrates, also called carbs, affect your blood glucose level more than any other type of food. Eating carbs naturally raises the amount of glucose in your blood. Carb counting is a method for keeping track of how many carbs you eat. Counting carbs is important to keep your blood glucose at a healthy level, especially if you use insulin or take certain oral diabetes medicines. It is important to know how many carbs you can safely have in each meal. This is different for every person. Your dietitian can help you calculate how many carbs you should have at each meal and for each snack. How does alcohol affect me? Alcohol can cause a sudden decrease in blood glucose (hypoglycemia), especially if you use insulin or take certain oral diabetes medicines. Hypoglycemia can be a life-threatening condition. Symptoms of hypoglycemia, such as sleepiness, dizziness, and confusion, are similar to symptoms of having too much alcohol. Do not drink  alcohol if: Your health care provider tells you not to drink. You are pregnant, may be pregnant, or are planning to become pregnant. If you drink alcohol: Do not drink on an empty stomach. Limit how much you use to: 0-1 drink a day for women. 0-2 drinks a day for men. Be aware of how much alcohol is in your drink. In the [place] one drink equals one 12 oz bottle of beer (355 mL), one 5 oz glass of wine (148 mL), or one 1 oz glass of hard liquor (44 mL). Keep yourself hydrated with water, diet soda, or unsweetened iced tea. Keep in mind that regular soda, juice, and other mixers may contain a lot of sugar and must be counted as carbs. What are tips for following this plan? Reading food labels Start by checking the serving size on the "Nutrition Facts" label of packaged foods and drinks. The amount of calories, carbs, fats, and other nutrients listed on the label is based on one serving of the item. Many items contain more than one serving per package. Check the total grams (g) of carbs in one serving. You can calculate the number of servings of carbs in one serving by dividing the total carbs by 15. For example, if a food has 30 g of total carbs per serving, it would be equal to 2 servings of carbs. Check the number of grams (g) of saturated fats and trans fats in one serving. Choose foods that have a low amount or none of these fats. Check the number of milligrams (mg) of salt (sodium) in one serving. Most people should limit  total sodium intake to less than 2,300 mg per day. Always check the nutrition information of foods labeled as "low-fat" or "nonfat." These foods may be higher in added sugar or refined carbs and should be avoided. Talk to your dietitian to identify your daily goals for nutrients listed on the label. Shopping Avoid buying canned, pre-made, or processed foods. These foods tend to be high in fat, sodium, and added sugar. Shop around the outside edge of the grocery store. This  is where you will most often find fresh fruits and vegetables, bulk grains, fresh meats, and fresh dairy. Cooking Use low-heat cooking methods, such as baking, instead of high-heat cooking methods like deep frying. Cook using healthy oils, such as olive, canola, or sunflower oil. Avoid cooking with butter, cream, or high-fat meats. Meal planning Eat meals and snacks regularly, preferably at the same times every day. Avoid going long periods of time without eating. Eat foods that are high in fiber, such as fresh fruits, vegetables, beans, and whole grains. Talk with your dietitian about how many servings of carbs you can eat at each meal. Eat 4-6 oz (112-168 g) of lean protein each day, such as lean meat, chicken, fish, eggs, or tofu. One ounce (oz) of lean protein is equal to: 1 oz (28 g) of meat, chicken, or fish. 1 egg.  cup (62 g) of tofu. Eat some foods each day that contain healthy fats, such as avocado, nuts, seeds, and fish. What foods should I eat? Fruits Berries. Apples. Oranges. Peaches. Apricots. Plums. Grapes. Mango. Papaya. Pomegranate. Kiwi. Cherries. Vegetables Lettuce. Spinach. Leafy greens, including kale, chard, collard greens, and mustard greens. Beets. Cauliflower. Cabbage. Broccoli. Carrots. Green beans. Tomatoes. Peppers. Onions. Cucumbers. Brussels sprouts. Grains Whole grains, such as whole-wheat or whole-grain bread, crackers, tortillas, cereal, and pasta. Unsweetened oatmeal. Quinoa. Brown or wild rice. Meats and other proteins Seafood. Poultry without skin. Lean cuts of poultry and beef. Tofu. Nuts. Seeds. Dairy Low-fat or fat-free dairy products such as milk, yogurt, and cheese. The items listed above may not be a complete list of foods and beverages you can eat. Contact a dietitian for more information. What foods should I avoid? Fruits Fruits canned with syrup. Vegetables Canned vegetables. Frozen vegetables with butter or cream sauce. Grains Refined  white flour and flour products such as bread, pasta, snack foods, and cereals. Avoid all processed foods. Meats and other proteins Fatty cuts of meat. Poultry with skin. Breaded or fried meats. Processed meat. Avoid saturated fats. Dairy Full-fat yogurt, cheese, or milk. Beverages Sweetened drinks, such as soda or iced tea. The items listed above may not be a complete list of foods and beverages you should avoid. Contact a dietitian for more information. Questions to ask a health care provider Do I need to meet with a diabetes educator? Do I need to meet with a dietitian? What number can I call if I have questions? When are the best times to check my blood glucose? Where to find more information: American Diabetes Association: diabetes.org Academy of Nutrition and Dietetics: www.eatright.org National Institute of Diabetes and Digestive and Kidney Diseases: www.niddk.nih.gov Association of Diabetes Care and Education Specialists: www.diabeteseducator.org Summary It is important to have healthy eating habits because your blood sugar (glucose) levels are greatly affected by what you eat and drink. A healthy meal plan will help you control your blood glucose and maintain a healthy lifestyle. Your health care provider may recommend that you work with a dietitian to make a meal plan that   is best for you. Keep in mind that carbohydrates (carbs) and alcohol have immediate effects on your blood glucose levels. It is important to count carbs and to use alcohol carefully. This information is not intended to replace advice given to you by your health care provider. Make sure you discuss any questions you have with your health care provider. Document Revised: 03/26/2019 Document Reviewed: 03/26/2019 Elsevier Patient Education  2021 Elsevier Inc.  Shingles Shingles is an infection. It gives you a painful skin rash and blisters that have fluid in them. Shingles is caused by the same germ (virus) that  causes chickenpox. Shingles only happens in people who: Have had chickenpox. Have been given a shot (vaccine) to protect against chickenpox. Shingles is rare in this group. What are the causes? This condition is caused by varicella-zoster virus. This is the same germ that causes chickenpox. After a person is exposed to the germ, the germ stays in the body but is not active (dormant). Shingles develops if the germ becomes active again (is reactivated). This can happen many years after the first exposure to the germ. It is not known what causes this germ to become active again. What increases the risk? People who have had chickenpox or received the chickenpox shot are at risk for shingles. This infection is more common in people who: Are older than 64 years of age. Have a weakened disease-fighting system (immune system), such as people with: HIV (human immunodeficiency virus). AIDS (acquired immunodeficiency syndrome). Cancer. Are taking medicines that weaken the immune system, such as organ transplant medicines. Have a lot of stress. What are the signs or symptoms? The first symptoms of shingles may be itching, tingling, or pain in an area on your skin. A rash will show on your skin a few days or weeks later. This is what usually happens: The rash is likely to be on one side of your body. The rash usually has a shape like a belt or a band. Over time, the rash turns into fluid-filled blisters. The blisters will break open and change into scabs. The scabs usually dry up in about 2-3 weeks. You may also have: A fever. Chills. A headache. A feeling like you may vomit (nausea). How is this treated? The rash may last for several weeks. There is not a specific cure for this condition. Your doctor may prescribe medicines. Medicines may: Help with pain. Help you get better sooner. Help to prevent long-term problems. Help with itching (antihistamines). If the area involved is on your face,  you may need to see a specialist. This may be an eye doctor or an ear, nose, and throat (ENT) doctor. Follow these instructions at home: Medicines Take over-the-counter and prescription medicines only as told by your doctor. Put on an anti-itch cream or numbing cream where you have a rash, blisters, or scabs. Do this as told by your doctor. Helping with itching and discomfort  Put cold, wet cloths (cold compresses) on the area of the rash or blisters as told by your doctor. Cool baths can help you feel better. Try adding baking soda or dry oatmeal to the water to lessen itching. Do not bathe in hot water. Use calamine lotion as told by your doctor. Blister and rash care Keep your rash covered with a loose bandage (dressing). Wear loose clothing that does not rub on your rash. Wash your hands with soap and water for at least 20 seconds before and after you change your bandage. If you cannot use  soap and water, use hand sanitizer. Change your bandage as told by your doctor. Keep your rash and blisters clean. To do this, wash the area with mild soap and cool water as told by your doctor. Check your rash every day for signs of infection. Check for: More redness, swelling, or pain. Fluid or blood. Warmth. Pus or a bad smell. Do not scratch your rash. Do not pick at your blisters. To help you to not scratch: Keep your fingernails clean and cut short. Wear gloves or mittens when you sleep, if scratching is a problem. General instructions Rest as told by your doctor. Wash your hands often with soap and water for at least 20 seconds. If you cannot use soap and water, use hand sanitizer. Doing this lowers your chance of getting a skin infection. Your infection can cause chickenpox in people who have never had chickenpox or never got a chickenpox vaccine shot. If you have blisters that did not change into scabs yet, try not to touch other people or be around other people,  especially: Babies. Pregnant women. Children who have areas of red, itchy, or rough skin (eczema). Older people who have organ transplants. People who have a long-term (chronic) illness, like cancer or AIDS. Keep all follow-up visits. How is this prevented? A vaccine shot is the best way to prevent shingles and protect against shingles problems. If you have not had a vaccine shot, talk with your doctor about getting it. Where to find more information Centers for Disease Control and Prevention: FootballExhibition.com.br Contact a doctor if: Your pain does not get better with medicine. Your pain does not get better after the rash heals. You have any of these signs of infection around the rash: More redness, swelling, or pain. Fluid or blood. Warmth. Pus or a bad smell. You have a fever. Get help right away if: The rash is on your face or nose. You have pain in your face or pain by your eye. You lose feeling on one side of your face. You have trouble seeing. You have ear pain, or you have ringing in your ear. You have a loss of taste. Your condition gets worse. Summary Shingles gives you a painful skin rash and blisters that have fluid in them. Shingles is caused by the same germ (virus) that causes chickenpox. Keep your rash covered with a loose bandage. Wear loose clothing that does not rub on your rash. If you have blisters that did not change into scabs yet, try not to touch other people or be around people. This information is not intended to replace advice given to you by your health care provider. Make sure you discuss any questions you have with your health care provider. Document Revised: 04/13/2020 Document Reviewed: 04/13/2020 Elsevier Patient Education  2022 [employer].

## 2021-02-25 NOTE — Progress Notes (Signed)
[facility] Patient Rebecca [name] [address] [place], [place]  [address] Phone:  [phone]   Fax:  [phone]   Established Patient Office Visit  Subjective:  Patient ID: Rebecca [name], female    DOB: Sep 01, 1955  Age: 65 y.o. MRN: [id number]  CC:  Chief Complaint  Patient presents with   Follow-up    Pt is here today for her follow up visit. Pt is needing refills on all her medications.     HPI Rebecca [name] presents for follow up. She  has a past medical history of CAD (coronary artery disease), Diabetes mellitus without complication (HCC), DM (diabetes mellitus), type 2, uncontrolled with complications (04/06/2012), Hypertension, Hypertensive emergency (11/14/2012), Hypokalemia (11/14/2012), Hyponatremia (04/08/2012), Mixed hyperlipidemia (04/06/2012), NSTEMI (non-ST elevated myocardial infarction) (HCC) (11/14/2012), and Tobacco abuse (04/06/2012).   She is following up on her diabetes. She was prescribed; Lantus,  glyburide and Metformin. She has not using the Lantus. She reports she has bent 5 needles so she has stopped. She has been out of her medication since Monday. She reports home CBG 140 ->250 . She reports weight loss. She is trying to eat better.   She reports that she is not sure why her BP is so elevated today. Denies headache, dizziness, visual changes, shortness of breath, dyspnea on exertion, chest pain, nausea, vomiting or any edema. She is currently prescribed Amlodipine 10 mg, lisinopril 40 mg and HCTZ 25 mg.  She reports that she has an area that blistered (2 blisters) about 1 week ago. She is unsure if it is poison ivy or possible shingles. Her dog did lay on her chest one day. She has some itching but denies any pain. She did have some drainage. She has placed a Band-Aid over the are to keep it protected. She denies any there symptoms. She remembers have chicken pox as a child. She has not been vaccinated. She has not used any OTC treatment. Past Medical History:   Diagnosis Date   CAD (coronary artery disease)    Diabetes mellitus without complication (HCC)    DM (diabetes mellitus), type 2, uncontrolled with complications 04/06/2012   Hypertension    Hypertensive emergency 11/14/2012   Hypokalemia 11/14/2012   Hyponatremia 04/08/2012   Mixed hyperlipidemia 04/06/2012   NSTEMI (non-ST elevated myocardial infarction) (HCC) 11/14/2012   Tobacco abuse 04/06/2012    Past Surgical History:  Procedure Laterality Date   ABDOMINAL AORTOGRAM W/LOWER EXTREMITY N/A 06/17/2020   Procedure: ABDOMINAL AORTOGRAM W/LOWER EXTREMITY;  Surgeon: [name], MD;  Location: [facility] INVASIVE CV [facility];  Service: Cardiovascular;  Laterality: N/A;   FEMORAL-POPLITEAL BYPASS GRAFT Left 09/20/2018   Procedure: Left  FEMORAL-BELOW KNEE POPLITEAL ARTERY Bypass with Translocated nonreversed saphenous vein;  Surgeon: Larina Earthly, MD;  Location: [facility] OR;  Service: Vascular;  Laterality: Left;   LEFT HEART CATHETERIZATION WITH CORONARY ANGIOGRAM N/A 11/14/2012   Procedure: LEFT HEART CATHETERIZATION WITH CORONARY ANGIOGRAM;  Surgeon: Pamella Pert, MD;  Location: [facility] CATH LAB;  Service: Cardiovascular;  Laterality: N/A;   LOWER EXTREMITY ANGIOGRAPHY N/A 09/05/2018   Procedure: LOWER EXTREMITY ANGIOGRAPHY;  Surgeon: Nada Libman, MD;  Location: [facility];  Service: Cardiovascular;  Laterality: N/A;   PERCUTANEOUS CORONARY STENT INTERVENTION (PCI-S)  11/14/2012   Procedure: PERCUTANEOUS CORONARY STENT INTERVENTION (PCI-S);  Surgeon: Pamella Pert, MD;  Location: [facility] CATH LAB;  Service: Cardiovascular;;   PERIPHERAL VASCULAR INTERVENTION Right 06/17/2020   Procedure: PERIPHERAL VASCULAR INTERVENTION;  Surgeon: Heath Lark  N, MD;  Location: [facility] CV [facility];  Service: Cardiovascular;  Laterality: Right;  Femoral/Popliteal    TONSILLECTOMY      Family History  Problem Relation Age of Onset   Diabetes Brother    Heart disease Brother    Arthritis Brother    Diabetes  Father    Heart disease Father     Social History   Socioeconomic History   Marital status: Single    Spouse name: Not on file   Number of children: Not on file   Years of education: Not on file   Highest education level: Not on file  Occupational History   Not on file  Tobacco Use   Smoking status: Former    Packs/day: 1.00    Types: Cigarettes    Quit date: 2019    Years since quitting: 3.8   Smokeless tobacco: Never  Vaping Use   Vaping Use: Never used  Substance and Sexual Activity   Alcohol use: Yes    Comment: occ   Drug use: No   Sexual activity: Not Currently  Other Topics Concern   Not on file  Social History Narrative   Not on file   Social Determinants of Health   Financial Resource Strain: Not on file  Food Insecurity: Not on file  Transportation Needs: Not on file  Physical Activity: Not on file  Stress: Not on file  Social Connections: Not on file  Intimate Partner Violence: Not on file    Outpatient Medications Prior to Visit  Medication Sig Dispense Refill   cholecalciferol (VITAMIN D3) 25 MCG (1000 UNIT) tablet Take 1,000 Units by mouth daily.     clopidogrel (PLAVIX) 75 MG tablet Take 1 tablet (75 mg total) by mouth daily with breakfast. 30 tablet 11   KRILL OIL 1000 MG CAPS Take 1,000 mg by mouth daily.      MAGNESIUM PO Take 1 tablet by mouth daily.     metFORMIN (GLUCOPHAGE) 1000 MG tablet Take 1 tablet (1,000 mg total) by mouth 2 (two) times daily with a meal. 180 tablet 3   amLODipine (NORVASC) 10 MG tablet Take 1 tablet by mouth once daily 90 tablet 0   aspirin 81 MG chewable tablet Chew 81 mg by mouth daily.     glyBURIDE (DIABETA) 5 MG tablet TAKE 2 TABLETS BY MOUTH ONCE DAILY WITH BREAKFAST 60 tablet 0   metoprolol tartrate (LOPRESSOR) 25 MG tablet Take 1 tablet by mouth twice daily 60 tablet 0   atorvastatin (LIPITOR) 80 MG tablet Take 1 tablet (80 mg total) by mouth daily at 6 PM. 90 tablet 3   hydrochlorothiazide (HYDRODIURIL) 25  MG tablet Take 1 tablet (25 mg total) by mouth daily. 90 tablet 3   insulin glargine (LANTUS) 100 UNIT/ML injection Inject 0.1 mLs (10 Units total) into the skin at bedtime. (Patient not taking: No sig reported) 10 mL 11   Insulin Syringe-Needle U-100 27G X 1/2" 0.5 ML MISC 10 Units by Does not apply route at bedtime. (Patient not taking: No sig reported) 100 each 2   lisinopril (ZESTRIL) 40 MG tablet Take 1 tablet (40 mg total) by mouth daily. 90 tablet 3   No facility-administered medications prior to visit.    Allergies  Allergen Reactions   Penicillins Rash and Other (See Comments)    Did it involve swelling of the face/tongue/throat, SOB, or low BP? No Did it involve sudden or SEVERE rash/hives, skin peeling, or any reaction on the inside of  your mouth or nose? #  #  #  YES  #  #  #  Did you need to seek medical attention at a hospital or doctor's office? #  #  #  YES  #  #  #  When did it last happen? Unknown    ROS Review of Systems    Objective:    Physical Exam HENT:     Head: Normocephalic and atraumatic.     Nose: Nose normal.     Mouth/Throat:     Mouth: Mucous membranes are moist.  Cardiovascular:     Rate and Rhythm: Normal rate and regular rhythm.     Pulses: Normal pulses.     Heart sounds: Normal heart sounds.  Pulmonary:     Effort: Pulmonary effort is normal.     Breath sounds: Normal breath sounds.  Abdominal:     Palpations: Abdomen is soft.     Comments: Increased abdominal girth   Musculoskeletal:        General: Normal range of motion.     Cervical back: Normal range of motion.     Right lower leg: No edema.     Left lower leg: No edema.  Skin:    General: Skin is warm.     Capillary Refill: Capillary refill takes less than 2 seconds.     Comments: Middle chest blister x 2  Neurological:     General: No focal deficit present.     Mental Status: She is alert and oriented to person, place, and time.  Psychiatric:        Mood and Affect: Mood  normal.        Behavior: Behavior normal.        Thought Content: Thought content normal.        Judgment: Judgment normal.    BP (!) 185/76 Comment: manually  Pulse 73   Temp 97.6 F (36.4 C)   Wt 147 lb 12.8 oz (67 kg)   SpO2 98%   BMI 25.37 kg/m  Wt Readings from Last 3 Encounters:  02/25/21 147 lb 12.8 oz (67 kg)  10/12/20 145 lb 9.6 oz (66 kg)  07/13/20 156 lb 8 oz (71 kg)     There are no preventive care reminders to display for this patient.   There are no preventive care reminders to display for this patient.  Lab Results  Component Value Date   TSH 1.190 11/30/2017   Lab Results  Component Value Date   WBC 5.3 11/15/2019   HGB 12.2 06/17/2020   HCT 36.0 06/17/2020   MCV 87 11/15/2019   PLT 198 11/15/2019   Lab Results  Component Value Date   NA 137 06/17/2020   K 4.7 06/17/2020   CO2 27 12/19/2018   GLUCOSE 394 (H) 06/17/2020   BUN 25 (H) 06/17/2020   CREATININE 0.90 06/17/2020   BILITOT 0.5 11/15/2019   ALKPHOS 128 (H) 11/15/2019   AST 14 11/15/2019   ALT 19 08/27/2018   PROT 7.1 11/15/2019   ALBUMIN 4.1 11/15/2019   CALCIUM 8.8 11/15/2019   ANIONGAP 14 09/20/2018   Lab Results  Component Value Date   CHOL 354 (H) 11/15/2019   Lab Results  Component Value Date   HDL 24 (L) 11/15/2019   Lab Results  Component Value Date   LDLCALC Comment (A) 11/15/2019   Lab Results  Component Value Date   TRIG 1,349 (HH) 11/15/2019   Lab Results  Component Value Date  CHOLHDL 14.8 (H) 11/15/2019   Lab Results  Component Value Date   HGBA1C 11.4 (A) 02/25/2021   HGBA1C 11.4 02/25/2021   HGBA1C 11.4 (A) 02/25/2021   HGBA1C 11.4 (A) 02/25/2021      Assessment & Plan:   Problem List Items Addressed This Visit       Cardiovascular and Mediastinum   Other   Mixed hyperlipidemia   Relevant Medications   metoprolol tartrate (LOPRESSOR) 25 MG tablet   lisinopril (ZESTRIL) 40 MG tablet   hydrochlorothiazide (HYDRODIURIL) 25 MG tablet    aspirin 81 MG chewable tablet   amLODipine (NORVASC) 10 MG tablet   atorvastatin (LIPITOR) 80 MG tablet   Other Relevant Orders   Lipid panel   Other Visit Diagnoses     [name] maintenance    -  Primary   Relevant Orders   POCT URINALYSIS DIP (CLINITEK) (Completed)   HgB A1c (Completed)   Glucose (CBG) (Completed)   Essential hypertension Worsening  Encouraged on going compliance with current medication regimen Encouraged home monitoring and recording BP <130/80 Eating a heart-healthy diet with less salt Encouraged regular physical activity  Recommend Weight loss         Relevant Medications   metoprolol tartrate (LOPRESSOR) 25 MG tablet   lisinopril (ZESTRIL) 40 MG tablet   hydrochlorothiazide (HYDRODIURIL) 25 MG tablet   aspirin 81 MG chewable tablet   amLODipine (NORVASC) 10 MG tablet   atorvastatin (LIPITOR) 80 MG tablet   Other Relevant Orders   Comp. Metabolic Panel (12)   Type 2 diabetes mellitus with hyperglycemia, with long-term current use of insulin (HCC)     Encourage compliance with current treatment regimen  Dose adjustment Lantus solostar pen 20 units qhs Encourage regular CBG monitoring Encourage contacting office if excessive hyperglycemia and or hypoglycemia Lifestyle modification with healthy diet (fewer calories, more high fiber foods, whole grains and non-starchy vegetables, lower fat meat and fish, low-fat diary include healthy oils) regular exercise (physical activity) and weight loss Opthalmology exam discussed  Nutritional consult recommended Regular dental visits encouraged Home BP monitoring also encouraged goal <130/80   Relevant Medications   lisinopril (ZESTRIL) 40 MG tablet   aspirin 81 MG chewable tablet   atorvastatin (LIPITOR) 80 MG tablet   glyBURIDE (DIABETA) 5 MG tablet   insulin glargine (LANTUS) 100 UNIT/ML Solostar Pen   Insulin Pen Needle (PEN NEEDLES) 30G X 8 MM MISC   Other Relevant Orders   Comp. Metabolic Panel (12)    Microalbumin, urine   Herpes zoster without complication   Culture pending due to the minimal blisters and HPI     Relevant Medications   valACYclovir (VALTREX) 1000 MG tablet   Other Relevant Orders   Varicella-zoster by PCR   WOUND CULTURE       Meds ordered this encounter  Medications   metoprolol tartrate (LOPRESSOR) 25 MG tablet    Sig: Take 1 tablet (25 mg total) by mouth 2 (two) times daily.    Dispense:  180 tablet    Refill:  3    Needs appointment   lisinopril (ZESTRIL) 40 MG tablet    Sig: Take 1 tablet (40 mg total) by mouth daily.    Dispense:  90 tablet    Refill:  3   hydrochlorothiazide (HYDRODIURIL) 25 MG tablet    Sig: Take 1 tablet (25 mg total) by mouth daily.    Dispense:  90 tablet    Refill:  3   aspirin 81 MG chewable tablet  Sig: Chew 1 tablet (81 mg total) by mouth daily.    Dispense:  90 tablet    Refill:  3   amLODipine (NORVASC) 10 MG tablet    Sig: Take 1 tablet (10 mg total) by mouth daily.    Dispense:  90 tablet    Refill:  3    Need appointment   atorvastatin (LIPITOR) 80 MG tablet    Sig: Take 1 tablet (80 mg total) by mouth daily at 6 PM.    Dispense:  90 tablet    Refill:  3   glyBURIDE (DIABETA) 5 MG tablet    Sig: Take 1 tablet (5 mg total) by mouth 2 (two) times daily with a meal.    Dispense:  180 tablet    Refill:  3    Needs appointment    Order Specific Question:   Supervising Provider    Answer:   Quentin Angst [[id number]]   insulin glargine (LANTUS) 100 UNIT/ML Solostar Pen    Sig: Inject 20 Units into the skin daily.    Dispense:  15 mL    Refill:  11    Order Specific Question:   Supervising Provider    Answer:   Quentin Angst [[id number]]   Insulin Pen Needle (PEN NEEDLES) 30G X 8 MM MISC    Sig: 1 pen by Does not apply route at bedtime.    Dispense:  100 each    Refill:  3    Order Specific Question:   Supervising Provider    Answer:   Quentin Angst [[id number]]   valACYclovir (VALTREX) 1000 MG  tablet    Sig: Take 1 tablet (1,000 mg total) by mouth 3 (three) times daily for 7 days.    Dispense:  21 tablet    Refill:  0    Order Specific Question:   Supervising Provider    Answer:   Quentin Angst [id number]    Follow-up: Return in about 6 weeks (around 04/08/2021) for follow up [id number], Follow up [id number].    Barbette Merino, NP

## 2021-02-26 ENCOUNTER — Other Ambulatory Visit: Payer: Self-pay | Admitting: Nurse Practitioner

## 2021-02-26 DIAGNOSIS — E782 Mixed hyperlipidemia: Secondary | ICD-10-CM

## 2021-02-26 LAB — LIPID PANEL
Chol/HDL Ratio: 8.1 ratio — ABNORMAL HIGH (ref 0.0–4.4)
Cholesterol, Total: 228 mg/dL — ABNORMAL HIGH (ref 100–199)
HDL: 28 mg/dL — ABNORMAL LOW (ref >39)
LDL Chol Calc (NIH): 96 mg/dL (ref 0–99)
Triglycerides: 618 mg/dL (ref 0–149)
VLDL Cholesterol Cal: 104 mg/dL — ABNORMAL HIGH (ref 5–40)

## 2021-02-26 LAB — COMP. METABOLIC PANEL (12)
AST: 13 IU/L (ref 0–40)
Albumin/Globulin Ratio: 1.2 (ref 1.2–2.2)
Albumin: 4 g/dL (ref 3.8–4.8)
Alkaline Phosphatase: 153 IU/L — ABNORMAL HIGH (ref 44–121)
BUN/Creatinine Ratio: 16 (ref 12–28)
BUN: 15 mg/dL (ref 8–27)
Bilirubin Total: 0.4 mg/dL (ref 0.0–1.2)
Calcium: 9.4 mg/dL (ref 8.7–10.3)
Chloride: 98 mmol/L (ref 96–106)
Creatinine, Ser: 0.92 mg/dL (ref 0.57–1.00)
Globulin, Total: 3.4 g/dL (ref 1.5–4.5)
Glucose: 310 mg/dL — ABNORMAL HIGH (ref 70–99)
Potassium: 4.6 mmol/L (ref 3.5–5.2)
Sodium: 137 mmol/L (ref 134–144)
Total Protein: 7.4 g/dL (ref 6.0–8.5)
eGFR: 70 mL/min/{1.73_m2} (ref >59)

## 2021-02-26 LAB — MICROALBUMIN, URINE: Microalbumin, Urine: 898 ug/mL

## 2021-03-10 LAB — WOUND CULTURE: Organism ID, Bacteria: NONE SEEN

## 2021-03-12 ENCOUNTER — Other Ambulatory Visit: Payer: Self-pay | Admitting: Nurse Practitioner

## 2021-03-12 MED ORDER — DOXYCYCLINE HYCLATE 100 MG PO CAPS: 100.0000 mg | ORAL_CAPSULE | Freq: Two times a day (BID) | ORAL | 0 refills | Status: AC

## 2021-04-08 ENCOUNTER — Ambulatory Visit: Payer: Self-pay | Admitting: Nurse Practitioner

## 2022-03-25 ENCOUNTER — Other Ambulatory Visit: Payer: Self-pay | Admitting: Nurse Practitioner

## 2022-03-25 DIAGNOSIS — I10 Essential (primary) hypertension: Secondary | ICD-10-CM

## 2023-01-12 ENCOUNTER — Ambulatory Visit
Admission: RE | Admit: 2023-01-12 | Discharge: 2023-01-12 | Disposition: A | Discharge status: Home / Self Care | Payer: 59 | Source: Ambulatory Visit | Attending: Internal Medicine | Admitting: Internal Medicine

## 2023-01-12 ENCOUNTER — Ambulatory Visit: Payer: 59

## 2023-01-12 VITALS — BP 163/85 | HR 76 | Temp 98.0°F | Resp 16

## 2023-01-12 DIAGNOSIS — M25511 Pain in right shoulder: Secondary | ICD-10-CM

## 2023-01-12 DIAGNOSIS — W19XXXA Unspecified fall, initial encounter: Secondary | ICD-10-CM | POA: Diagnosis not present

## 2023-01-12 DIAGNOSIS — S50311A Abrasion of right elbow, initial encounter: Secondary | ICD-10-CM

## 2023-01-12 DIAGNOSIS — S62656A Nondisplaced fracture of medial phalanx of right little finger, initial encounter for closed fracture: Secondary | ICD-10-CM | POA: Diagnosis not present

## 2023-01-12 DIAGNOSIS — M25521 Pain in right elbow: Secondary | ICD-10-CM | POA: Diagnosis not present

## 2023-01-12 DIAGNOSIS — M79641 Pain in right hand: Secondary | ICD-10-CM

## 2023-01-12 NOTE — ED Triage Notes (Signed)
Right shoulder pain that started a week ago. Pt states she fell while taking her dog for a walk, and now having pain in her right pinky finger and right shoulder when moving her arms up.

## 2023-01-12 NOTE — ED Provider Notes (Signed)
[facility]    CSN: [id number] Arrival date & time: 01/12/23  0847      History   Chief Complaint Chief Complaint  Patient presents with   Shoulder Pain    Right shoulder fell on 9/3 pain for certain movements - Entered by patient    HPI Rebecca [name] is a 67 y.o. female.   Patient presents with right upper extremity pain after fall that occurred on 01/03/2023 while walking her dog.  Patient states that her dog began to run away and she tried to pull him back with leash which caused her to fall forward with her hands outstretched.  Reports that she has been having right upper shoulder pain and right pinky pain.  She also has an abrasion to her right elbow.  Denies numbness or tingling.  Reports the pain in the shoulder occurs when she outstretches her arms.  She has taken ibuprofen for pain.  She also reported to nursing staff that she has been off of her daily prescription medications for approximately 1 year as she has not seen her PCP.  Last tetanus was in 2021. She previously took plavix but states she was only supposed to take that for a year so she no longer takes that.    Shoulder Pain   Past Medical History:  Diagnosis Date   CAD (coronary artery disease)    Diabetes mellitus without complication (HCC)    DM (diabetes mellitus), type 2, uncontrolled with complications 04/06/2012   Hypertension    Hypertensive emergency 11/14/2012   Hypokalemia 11/14/2012   Hyponatremia 04/08/2012   Mixed hyperlipidemia 04/06/2012   NSTEMI (non-ST elevated myocardial infarction) (HCC) 11/14/2012   Tobacco abuse 04/06/2012    Patient Active Problem List   Diagnosis Date Noted   Uncontrolled type 2 diabetes mellitus with microalbuminuria 11/22/2019   PAD (peripheral artery disease) (HCC) 09/05/2018   Hypertensive emergency 11/14/2012   Hypokalemia 11/14/2012   NSTEMI (non-ST elevated myocardial infarction) (HCC) 11/14/2012   Hyponatremia 04/08/2012   Hypertension 04/06/2012    Chest pain 04/06/2012   DM (diabetes mellitus), type 2, uncontrolled with complications 04/06/2012   Tobacco abuse 04/06/2012   Mixed hyperlipidemia 04/06/2012    Past Surgical History:  Procedure Laterality Date   ABDOMINAL AORTOGRAM W/LOWER EXTREMITY N/A 06/17/2020   Procedure: ABDOMINAL AORTOGRAM W/LOWER EXTREMITY;  Surgeon: [name], MD;  [facility];  Service: Cardiovascular;  Laterality: N/A;   FEMORAL-POPLITEAL BYPASS GRAFT Left 09/20/2018   Procedure: Left  FEMORAL-BELOW KNEE POPLITEAL ARTERY Bypass with Translocated nonreversed saphenous vein;  Surgeon: Larina Earthly, MD;  Location: [facility] OR;  Service: Vascular;  Laterality: Left;   LEFT HEART CATHETERIZATION WITH CORONARY ANGIOGRAM N/A 11/14/2012   Procedure: LEFT HEART CATHETERIZATION WITH CORONARY ANGIOGRAM;  Surgeon: Pamella Pert, MD;  Location: [facility] CATH LAB;  Service: Cardiovascular;  Laterality: N/A;   LOWER EXTREMITY ANGIOGRAPHY N/A 09/05/2018   Procedure: LOWER EXTREMITY ANGIOGRAPHY;  Surgeon: Nada Libman, MD;  [facility]: [facility];  Service: Cardiovascular;  Laterality: N/A;   PERCUTANEOUS CORONARY STENT INTERVENTION (PCI-S)  11/14/2012   Procedure: PERCUTANEOUS CORONARY STENT INTERVENTION (PCI-S);  Surgeon: Pamella Pert, MD;  Location: [facility] CATH LAB;  Service: Cardiovascular;;   PERIPHERAL VASCULAR INTERVENTION Right 06/17/2020   Procedure: PERIPHERAL VASCULAR INTERVENTION;  Surgeon: [name], MD;  [facility]: [facility];  Service: Cardiovascular;  Laterality: Right;  Femoral/Popliteal    TONSILLECTOMY      OB History  No obstetric history on file.      Home Medications    Prior to Admission medications   Medication Sig Start Date End Date Taking? Authorizing Provider  amLODipine (NORVASC) 10 MG tablet TAKE 1 TABLET BY MOUTH ONCE DAILY . APPOINTMENT REQUIRED FOR FUTURE REFILLS 03/28/22   Ivonne Andrew, NP  atorvastatin (LIPITOR) 80 MG tablet Take 1 tablet (80  mg total) by mouth daily at 6 PM. 02/25/21 02/20/22  Barbette Merino, NP  cholecalciferol (VITAMIN D3) 25 MCG (1000 UNIT) tablet Take 1,000 Units by mouth daily.    [provider]  clopidogrel (PLAVIX) 75 MG tablet Take 1 tablet (75 mg total) by mouth daily with breakfast. 06/18/20   [name], MD  glyBURIDE (DIABETA) 5 MG tablet Take 1 tablet (5 mg total) by mouth 2 (two) times daily with a meal. 02/25/21 02/25/22  Barbette Merino, NP  hydrochlorothiazide (HYDRODIURIL) 25 MG tablet Take 1 tablet by mouth once daily 03/28/22   Ivonne Andrew, NP  insulin glargine (LANTUS) 100 UNIT/ML Solostar Pen Inject 20 Units into the skin daily. 02/25/21   Barbette Merino, NP  KRILL OIL 1000 MG CAPS Take 1,000 mg by mouth daily.     [provider]  lisinopril (ZESTRIL) 40 MG tablet Take 1 tablet by mouth once daily 03/28/22   Ivonne Andrew, NP  MAGNESIUM PO Take 1 tablet by mouth daily.    [provider]  metFORMIN (GLUCOPHAGE) 1000 MG tablet Take 1 tablet (1,000 mg total) by mouth 2 (two) times daily with a meal. 11/15/19   Barbette Merino, NP  metoprolol tartrate (LOPRESSOR) 25 MG tablet Take 1 tablet (25 mg total) by mouth 2 (two) times daily. 02/25/21 02/25/22  Barbette Merino, NP    Family History Family History  Problem Relation Age of Onset   Diabetes Brother    Heart disease Brother    Arthritis Brother    Diabetes Father    Heart disease Father     Social History Social History   Tobacco Use   Smoking status: Former    Current packs/day: 0.00    Types: Cigarettes    Quit date: 2019    Years since quitting: 5.7   Smokeless tobacco: Never  Vaping Use   Vaping status: Never Used  Substance Use Topics   Alcohol use: Yes    Comment: occ   Drug use: No     Allergies   Penicillins   Review of Systems Review of Systems Per HPI  Physical Exam Triage Vital Signs ED Triage Vitals  Encounter Vitals Group     BP 01/12/23 0914 (!) 163/85      Systolic BP Percentile --      Diastolic BP Percentile --      Pulse Rate 01/12/23 0914 76     Resp 01/12/23 0914 16     Temp 01/12/23 0914 98 F (36.7 C)     Temp Source 01/12/23 0914 Oral     SpO2 01/12/23 0914 96 %     Weight --      Height --      Head Circumference --      Peak Flow --      Pain Score 01/12/23 0915 4     Pain Loc --      Pain Education --      Exclude from Growth Chart --    No data found.  Updated Vital Signs BP (!) 163/85 (  BP Location: Left Arm)   Pulse 76   Temp 98 F (36.7 C) (Oral)   Resp 16   SpO2 96%   Visual Acuity Right Eye Distance:   Left Eye Distance:   Bilateral Distance:    Right Eye Near:   Left Eye Near:    Bilateral Near:     Physical Exam Constitutional:      General: She is not in acute distress.    Appearance: Normal appearance. She is not toxic-appearing or diaphoretic.  HENT:     Head: Normocephalic and atraumatic.  Eyes:     Extraocular Movements: Extraocular movements intact.     Conjunctiva/sclera: Conjunctivae normal.  Pulmonary:     Effort: Pulmonary effort is normal.  Musculoskeletal:     Comments: No obvious tenderness to palpation to right shoulder but patient reports that she has anterior shoulder pain when hand is outstretched.  Abrasion noted that is very minimal and superficial that is scabbed over present to the lateral right elbow.  No tenderness to palpation surrounding this area or any obvious swelling.  Patient also has associated swelling throughout right fifth digit.  No tenderness to palpation to hand but patient reports tenderness with range of motion of fifth digit.  Grip strength is 5/5.  Full range of motion of all upper extremities present.  Appears to be neurovascularly intact.  Neurological:     General: No focal deficit present.     Mental Status: She is alert and oriented to person, place, and time. Mental status is at baseline.  Psychiatric:        Mood and Affect: Mood normal.         Behavior: Behavior normal.        Thought Content: Thought content normal.        Judgment: Judgment normal.      UC Treatments / Results  Labs (all labs ordered are listed, but only abnormal results are displayed) Labs Reviewed - No data to display  EKG   Radiology DG Hand Complete Right  Result Date: 01/12/2023 CLINICAL DATA:  Fall.  Pinky finger pain. EXAM: RIGHT HAND - COMPLETE 3+ VIEW COMPARISON:  None Available. FINDINGS: Possible but not definite fracture involving the base of the pinky finger middle phalanx. Exam limited by superimposition of bony anatomy on the lateral film. No subluxation or dislocation. Degenerative changes noted in scattered IP joints. No worrisome lytic or sclerotic osseous abnormality. IMPRESSION: Possible but not definite fracture involving the base of the pinky finger middle phalanx. Correlation for point tenderness recommended. Electronically Signed   By: Kennith Center M.D.   On: 01/12/2023 10:48   DG Elbow Complete Right  Result Date: 01/12/2023 CLINICAL DATA:  Fall.  Pain. EXAM: RIGHT ELBOW - COMPLETE 3+ VIEW COMPARISON:  None Available. FINDINGS: There is no evidence of fracture, dislocation, or joint effusion. There is no evidence of arthropathy or other focal bone abnormality. Soft tissues are unremarkable. IMPRESSION: Negative. Electronically Signed   By: Kennith Center M.D.   On: 01/12/2023 10:46   DG Shoulder Right  Result Date: 01/12/2023 CLINICAL DATA:  Fall.  Shoulder pain. EXAM: RIGHT SHOULDER - 2+ VIEW COMPARISON:  None Available. FINDINGS: No evidence for an acute fracture. No shoulder separation or dislocation. Degenerative changes are noted in the glenohumeral joint. IMPRESSION: Degenerative changes in the glenohumeral joint without acute bony findings. Electronically Signed   By: Kennith Center M.D.   On: 01/12/2023 10:45    Procedures Procedures (including critical care  time)  Medications Ordered in UC Medications - No data to  display  Initial Impression / Assessment and Plan / UC Course  I have reviewed the triage vital signs and the nursing notes.  Pertinent labs & imaging results that were available during my care of the patient were reviewed by me and considered in my medical decision making (see chart for details).     X-ray of right hand showing concern for nondisplaced fracture of the right fifth middle phalanx.  Finger splint placed by clinical staff prior to discharge.  X-ray of shoulder and elbow were negative for any acute bony abnormality.  Suspect bruising of elbow with abrasion.  No signs of infection to abrasion.  Advised patient to monitor for signs of infection and follow-up if they occur.  Tetanus vaccine up-to-date in 2021.  Shoulder pain is most likely due to muscular strain/injury.  Patient has full range of motion and is neurovascularly intact so no concern for any type of muscular tear.  Although, recommended follow-up with orthopedist at provided contact information for both finger fracture and upper extremity pain.  Patient offered pain medication but declined.  Clinical staff set patient up with PCP appointment for 01/18/2023 as well.  Advised strict return precautions.  Patient verbalized understanding and was agreeable with plan. Final Clinical Impressions(s) / UC Diagnoses   Final diagnoses:  Closed nondisplaced fracture of middle phalanx of right little finger, initial encounter  Fall, initial encounter  Acute pain of right shoulder  Abrasion of right elbow, initial encounter     Discharge Instructions      It appears that you have have a fracture of your finger so splint has been placed.  No pushing, pulling, lifting with right upper extremity until otherwise advised.  Please follow-up with orthopedist at provided contact for further evaluation and management of upper extremity pain and finger fracture.  Apply ice to affected areas.     ED Prescriptions   None    PDMP not  reviewed this encounter.   Gustavus Bryant, [place] 01/12/23 1122

## 2023-01-12 NOTE — Discharge Instructions (Signed)
It appears that you have have a fracture of your finger so splint has been placed.  No pushing, pulling, lifting with right upper extremity until otherwise advised.  Please follow-up with orthopedist at provided contact for further evaluation and management of upper extremity pain and finger fracture.  Apply ice to affected areas.

## 2023-01-18 ENCOUNTER — Ambulatory Visit (HOSPITAL_BASED_OUTPATIENT_CLINIC_OR_DEPARTMENT_OTHER): Payer: 59 | Admitting: Family Medicine

## 2023-01-18 VITALS — BP 178/82 | HR 58 | Ht 64.0 in | Wt 146.0 lb

## 2023-01-18 DIAGNOSIS — S62659A Nondisplaced fracture of medial phalanx of unspecified finger, initial encounter for closed fracture: Secondary | ICD-10-CM | POA: Diagnosis not present

## 2023-01-18 DIAGNOSIS — I739 Peripheral vascular disease, unspecified: Secondary | ICD-10-CM

## 2023-01-18 DIAGNOSIS — I10 Essential (primary) hypertension: Secondary | ICD-10-CM | POA: Diagnosis not present

## 2023-01-18 DIAGNOSIS — M25511 Pain in right shoulder: Secondary | ICD-10-CM | POA: Insufficient documentation

## 2023-01-18 DIAGNOSIS — E1165 Type 2 diabetes mellitus with hyperglycemia: Secondary | ICD-10-CM | POA: Diagnosis not present

## 2023-01-18 DIAGNOSIS — E785 Hyperlipidemia, unspecified: Secondary | ICD-10-CM

## 2023-01-18 DIAGNOSIS — E1169 Type 2 diabetes mellitus with other specified complication: Secondary | ICD-10-CM

## 2023-01-18 DIAGNOSIS — Z7689 Persons encountering health services in other specified circumstances: Secondary | ICD-10-CM

## 2023-01-18 LAB — POCT GLYCOSYLATED HEMOGLOBIN (HGB A1C): Hemoglobin A1C: 9 % — AB (ref 4.0–5.6)

## 2023-01-18 MED ORDER — LISINOPRIL 40 MG PO TABS: 40.0000 mg | ORAL_TABLET | Freq: Every day | ORAL | 0 refills | Status: DC

## 2023-01-18 MED ORDER — METOPROLOL TARTRATE 25 MG PO TABS: 25.0000 mg | ORAL_TABLET | Freq: Two times a day (BID) | ORAL | 0 refills | Status: AC

## 2023-01-18 MED ORDER — HYDROCHLOROTHIAZIDE 25 MG PO TABS: 25.0000 mg | ORAL_TABLET | Freq: Every day | ORAL | 0 refills | Status: DC

## 2023-01-18 MED ORDER — AMLODIPINE BESYLATE 10 MG PO TABS: 10.0000 mg | ORAL_TABLET | Freq: Every day | ORAL | 0 refills | Status: DC

## 2023-01-18 NOTE — Progress Notes (Signed)
New Patient Office Visit  Subjective    Patient ID: Rebecca [name], female    DOB: Sep 16, 1955  Age: 67 y.o. MRN: [id number]  HPI Rebecca [name] is a 67 year-old female who presents to establish care. She has concerns today about her recent UC visit on 01/12/2023. Had a closed nondisplaced fracture of middle phalanx of right little finger. Reports her finger is much better, only a fracture, and she is now able to move it. She reports her shoulder is still painful, but not as bad. She has been resting, using ice, and taking ibuprofen with relief. She was given an ortho office to follow-up with but reports she can't afford this at this time.   Former PCP visit: 01/2021 with Thad Ranger, NP  DM2- was on glyburide 5mg  & Lantus 20 units at bedtime. She reports she is unable to use the insulin needles due to bending of the needles.  Had a difficult time injecting medication  POCT A1c 9.0 She reports she is watching carb intake.   HTN- amlodipine 10mg  daily, hydrochlorothiazide 25mg  daily, lisinopril 40mg , metoprolol 25mg  twice daily  Does check BP at home: 130s/70s  HLD- lipitor 80mg  at bedtime   PAD- Plavix?? 2 stents in R legs Bypass in L leg 4 years ago  Denies leg pain   History of NSTEMI 11/14/2012 Tobacco abuse 04/06/2012  Outpatient Encounter Medications as of 01/18/2023  Medication Sig   atorvastatin (LIPITOR) 80 MG tablet Take 1 tablet (80 mg total) by mouth daily at 6 PM.   cholecalciferol (VITAMIN D3) 25 MCG (1000 UNIT) tablet Take 1,000 Units by mouth daily.   KRILL OIL 1000 MG CAPS Take 1,000 mg by mouth daily.    MAGNESIUM PO Take 1 tablet by mouth daily.   [DISCONTINUED] amLODipine (NORVASC) 10 MG tablet TAKE 1 TABLET BY MOUTH ONCE DAILY . APPOINTMENT REQUIRED FOR FUTURE REFILLS   [DISCONTINUED] hydrochlorothiazide (HYDRODIURIL) 25 MG tablet Take 1 tablet by mouth once daily   [DISCONTINUED] lisinopril (ZESTRIL) 40 MG tablet Take 1 tablet by mouth once daily    [DISCONTINUED] metoprolol tartrate (LOPRESSOR) 25 MG tablet Take 1 tablet (25 mg total) by mouth 2 (two) times daily.   amLODipine (NORVASC) 10 MG tablet Take 1 tablet (10 mg total) by mouth daily.   clopidogrel (PLAVIX) 75 MG tablet Take 1 tablet (75 mg total) by mouth daily with breakfast. (Patient not taking: Reported on 01/18/2023)   hydrochlorothiazide (HYDRODIURIL) 25 MG tablet Take 1 tablet (25 mg total) by mouth daily.   lisinopril (ZESTRIL) 40 MG tablet Take 1 tablet (40 mg total) by mouth daily.   metoprolol tartrate (LOPRESSOR) 25 MG tablet Take 1 tablet (25 mg total) by mouth 2 (two) times daily.   [DISCONTINUED] glyBURIDE (DIABETA) 5 MG tablet Take 1 tablet (5 mg total) by mouth 2 (two) times daily with a meal. (Patient not taking: Reported on 01/18/2023)   [DISCONTINUED] insulin glargine (LANTUS) 100 UNIT/ML Solostar Pen Inject 20 Units into the skin daily. (Patient not taking: Reported on 01/18/2023)   [DISCONTINUED] metFORMIN (GLUCOPHAGE) 1000 MG tablet Take 1 tablet (1,000 mg total) by mouth 2 (two) times daily with a meal. (Patient not taking: Reported on 01/18/2023)   No facility-administered encounter medications on file as of 01/18/2023.    Past Medical History:  Diagnosis Date   Allergy 1963   Anxiety    CAD (coronary artery disease)    Depression    Diabetes mellitus without complication (HCC)    DM (  diabetes mellitus), type 2, uncontrolled with complications 04/06/2012   Hypertension    Hypertensive emergency 11/14/2012   Hypokalemia 11/14/2012   Hyponatremia 04/08/2012   Mixed hyperlipidemia 04/06/2012   NSTEMI (non-ST elevated myocardial infarction) (HCC) 11/14/2012   Tobacco abuse 04/06/2012    Past Surgical History:  Procedure Laterality Date   ABDOMINAL AORTOGRAM W/LOWER EXTREMITY N/A 06/17/2020   Procedure: ABDOMINAL AORTOGRAM W/LOWER EXTREMITY;  Surgeon: [name], MD;  Location: [facility];  Service: Cardiovascular;  Laterality: N/A;    FEMORAL-POPLITEAL BYPASS GRAFT Left 09/20/2018   Procedure: Left  FEMORAL-BELOW KNEE POPLITEAL ARTERY Bypass with Translocated nonreversed saphenous vein;  Surgeon: Larina Earthly, MD;  Location: [facility] OR;  Service: Vascular;  Laterality: Left;   FRACTURE SURGERY  1967   LEFT HEART CATHETERIZATION WITH CORONARY ANGIOGRAM N/A 11/14/2012   Procedure: LEFT HEART CATHETERIZATION WITH CORONARY ANGIOGRAM;  Surgeon: Pamella Pert, MD;  Location: [facility] CATH LAB;  Service: Cardiovascular;  Laterality: N/A;   LOWER EXTREMITY ANGIOGRAPHY N/A 09/05/2018   Procedure: LOWER EXTREMITY ANGIOGRAPHY;  Surgeon: Nada Libman, MD;  [facility]: [facility];  Service: Cardiovascular;  Laterality: N/A;   PERCUTANEOUS CORONARY STENT INTERVENTION (PCI-S)  11/14/2012   Procedure: PERCUTANEOUS CORONARY STENT INTERVENTION (PCI-S);  Surgeon: Pamella Pert, MD;  Location: [facility] CATH LAB;  Service: Cardiovascular;;   PERIPHERAL VASCULAR INTERVENTION Right 06/17/2020   Procedure: PERIPHERAL VASCULAR INTERVENTION;  Surgeon: [name], MD;  Location: [facility];  Service: Cardiovascular;  Laterality: Right;  Femoral/Popliteal    SPINE SURGERY     TONSILLECTOMY      Family History  Problem Relation Age of Onset   Diabetes Brother    Heart disease Brother    Arthritis Brother    Stroke Mother    Diabetes Father    Heart disease Father    Review of Systems  Constitutional:  Negative for malaise/fatigue.  Eyes:  Negative for blurred vision and double vision.  Respiratory:  Negative for cough and shortness of breath.   Cardiovascular:  Negative for chest pain, palpitations and leg swelling.  Gastrointestinal:  Negative for abdominal pain, nausea and vomiting.  Musculoskeletal:  Negative for myalgias.  Neurological:  Negative for dizziness, weakness and headaches.  Psychiatric/Behavioral:  Negative for depression and suicidal ideas. The patient is not nervous/anxious and does not have insomnia.       Objective    BP (!) 178/82   Pulse (!) 58   Ht 5\' 4"  (1.626 m)   Wt 146 lb (66.2 kg)   BMI 25.06 kg/m   Physical Exam Constitutional:      Appearance: Normal appearance.  Cardiovascular:     Rate and Rhythm: Normal rate and regular rhythm.     Pulses: Normal pulses.     Heart sounds: Normal heart sounds.  Pulmonary:     Effort: Pulmonary effort is normal.     Breath sounds: Normal breath sounds.  Neurological:     Mental Status: She is alert.  Psychiatric:        Mood and Affect: Mood normal.        Behavior: Behavior normal.        Thought Content: Thought content normal.        Judgment: Judgment normal.     Assessment & Plan:   1. Encounter to establish care Patient is a 67 year-old female who presents today to establish care with primary care at [facility]. Reviewed the past medical history, family  history, social history, surgical history, medications and allergies today- updates made as indicated. She has a past medical history of uncontrolled DM2, HTN, HLD, and PAD. Patient does not have any concerns today.   2. Closed nondisplaced fracture of middle phalanx of finger of right hand Patient experienced a fall on 01/03/2023 while walking her dog and went to [facility] on 01/12/23. X-ray of th eright hand showing concern for nondisplaced fracture of the right fifth middle phalanx and patient was given a finger splint. X-ray of R shoulder was negative for acute bony abnormalities. Patient was recommended to follow-up with an orthopedist. Patient reports she is unable to afford seeing an ortho specialist at this time.   3. Acute pain of right shoulder Shoulder pain is most likely due to muscular strain/injury. Patient has full ROM of right shoulder and is neurovascularly intact. Advised patient to continue using NSAIDs, apply ice to affected areas, and avoid heavy lifting. Advise her that it may take take 6-8 weeks to fully heal. Patient unable to see ortho specialist due to  financial strain, would be reasonable to refer in the future if pain persists/worsens.   4. Uncontrolled type 2 diabetes mellitus with hyperglycemia (HCC) Hemoglobin A1c done in office, resulting at 9.0. Patient reports she has been off of her medication "for a while." Will obtain urine microalbumin/creatinine ratio and CMP to determine medication regimen for patient. She reports stopping metformin due to Gi side effects. Patient has a difficult time injecting insulin (due to bending of the needles)- will most likely start on oral medication.   - POCT HgB A1C - Urine Microalbumin w/creat. ratio  5. Essential hypertension Patient presents today with elevated blood pressure. Patient in no acute distress and is well-appearing. Denies chest pain, shortness of breath, lower extremity edema, vision changes, headaches. Cardiovascular exam with heart regular rate and rhythm. Normal heart sounds, no murmurs present. No lower extremity edema present. Lungs clear to auscultation bilaterally. Patient is currently taking amlodipine 10mg  daily, metoprolol 25mg  twice daily, hydrochlorothiazide 25mg  daily, and lisinopril 40mg  daily. Refills provided today. Advised patient to closely monitor blood pressure at home. Follow-up in 4 weeks.    - amLODipine (NORVASC) 10 MG tablet; Take 1 tablet (10 mg total) by mouth daily.  Dispense: 90 tablet; Refill: 0 - metoprolol tartrate (LOPRESSOR) 25 MG tablet; Take 1 tablet (25 mg total) by mouth 2 (two) times daily.  Dispense: 180 tablet; Refill: 0 - hydrochlorothiazide (HYDRODIURIL) 25 MG tablet; Take 1 tablet (25 mg total) by mouth daily.  Dispense: 90 tablet; Refill: 0 - lisinopril (ZESTRIL) 40 MG tablet; Take 1 tablet (40 mg total) by mouth daily.  Dispense: 90 tablet; Refill: 0  6. PAD (peripheral artery disease) (HCC) Patient followed by vascular & vein specialists in 09/2020. Review of note from 10/12/2020- ABI results unchanged from prior study, 50-99& stenosis both in  proximal stent in RLE and proximal graft in LLE. States she will continue her ASA, statin and plavix; however, patient informed me she was only supposed to take Plavix x1 year. Patient was supposed to follow-up in 6 months but never had an appointment.   7. Hyperlipidemia associated with type 2 diabetes mellitus (HCC) Patient currently taking atorvastatin 80mg  daily. Plan to assess lipid panel today.  - Lipid panel   Return in about 4 weeks (around 02/15/2023) for HTN follow-up.   Alyson Reedy, FNP

## 2023-01-19 LAB — LIPID PANEL
Chol/HDL Ratio: 8.8 ratio — ABNORMAL HIGH (ref 0.0–4.4)
Cholesterol, Total: 274 mg/dL — ABNORMAL HIGH (ref 100–199)
HDL: 31 mg/dL — ABNORMAL LOW (ref >39)
LDL Chol Calc (NIH): 127 mg/dL — ABNORMAL HIGH (ref 0–99)
Triglycerides: 631 mg/dL (ref 0–149)
VLDL Cholesterol Cal: 116 mg/dL — ABNORMAL HIGH (ref 5–40)

## 2023-01-19 LAB — CBC WITH DIFFERENTIAL/PLATELET
Basophils Absolute: 0 10*3/uL (ref 0.0–0.2)
Basos: 1 %
EOS (ABSOLUTE): 0.2 10*3/uL (ref 0.0–0.4)
Eos: 3 %
Hematocrit: 40.8 % (ref 34.0–46.6)
Hemoglobin: 13.4 g/dL (ref 11.1–15.9)
Immature Grans (Abs): 0 10*3/uL (ref 0.0–0.1)
Immature Granulocytes: 1 %
Lymphocytes Absolute: 1.2 10*3/uL (ref 0.7–3.1)
Lymphs: 24 %
MCH: 28.6 pg (ref 26.6–33.0)
MCHC: 32.8 g/dL (ref 31.5–35.7)
MCV: 87 fL (ref 79–97)
Monocytes Absolute: 0.3 10*3/uL (ref 0.1–0.9)
Monocytes: 5 %
Neutrophils Absolute: 3.3 10*3/uL (ref 1.4–7.0)
Neutrophils: 66 %
Platelets: 244 10*3/uL (ref 150–450)
RBC: 4.69 x10E6/uL (ref 3.77–5.28)
RDW: 14.5 % (ref 11.7–15.4)
WBC: 5 10*3/uL (ref 3.4–10.8)

## 2023-01-19 LAB — COMPREHENSIVE METABOLIC PANEL
ALT: 14 IU/L (ref 0–32)
AST: 12 IU/L (ref 0–40)
Albumin: 3.5 g/dL — ABNORMAL LOW (ref 3.9–4.9)
Alkaline Phosphatase: 139 IU/L — ABNORMAL HIGH (ref 44–121)
BUN/Creatinine Ratio: 16 (ref 12–28)
BUN: 14 mg/dL (ref 8–27)
Bilirubin Total: 0.4 mg/dL (ref 0.0–1.2)
CO2: 27 mmol/L (ref 20–29)
Calcium: 8.7 mg/dL (ref 8.7–10.3)
Chloride: 99 mmol/L (ref 96–106)
Creatinine, Ser: 0.9 mg/dL (ref 0.57–1.00)
Globulin, Total: 3.2 g/dL (ref 1.5–4.5)
Glucose: 280 mg/dL — ABNORMAL HIGH (ref 70–99)
Potassium: 4.3 mmol/L (ref 3.5–5.2)
Sodium: 139 mmol/L (ref 134–144)
Total Protein: 6.7 g/dL (ref 6.0–8.5)
eGFR: 71 mL/min/{1.73_m2} (ref >59)

## 2023-01-19 LAB — TSH RFX ON ABNORMAL TO FREE T4: TSH: 1.77 u[IU]/mL (ref 0.450–4.500)

## 2023-01-19 LAB — MICROALBUMIN / CREATININE URINE RATIO
Creatinine, Urine: 86.1 mg/dL
Microalb/Creat Ratio: 7915 mg/g creat — ABNORMAL HIGH (ref 0–29)
Microalbumin, Urine: 6814.4 ug/mL

## 2023-01-20 ENCOUNTER — Other Ambulatory Visit (HOSPITAL_BASED_OUTPATIENT_CLINIC_OR_DEPARTMENT_OTHER): Payer: Self-pay | Admitting: Family Medicine

## 2023-01-20 DIAGNOSIS — E782 Mixed hyperlipidemia: Secondary | ICD-10-CM

## 2023-01-20 MED ORDER — ICOSAPENT ETHYL 1 G PO CAPS: 2.0000 g | ORAL_CAPSULE | Freq: Two times a day (BID) | ORAL | 2 refills | Status: DC

## 2023-01-20 MED ORDER — ROSUVASTATIN CALCIUM 20 MG PO TABS: 20.0000 mg | ORAL_TABLET | Freq: Every day | ORAL | 2 refills | Status: DC

## 2023-01-20 MED ORDER — ROSUVASTATIN CALCIUM 40 MG PO TABS: 40.0000 mg | ORAL_TABLET | Freq: Every day | ORAL | 2 refills | Status: DC

## 2023-01-20 MED ORDER — DAPAGLIFLOZIN PROPANEDIOL 5 MG PO TABS: 5.0000 mg | ORAL_TABLET | Freq: Every day | ORAL | 2 refills | Status: AC

## 2023-01-24 ENCOUNTER — Encounter (HOSPITAL_BASED_OUTPATIENT_CLINIC_OR_DEPARTMENT_OTHER): Payer: Self-pay

## 2023-01-31 ENCOUNTER — Telehealth: Payer: Self-pay | Admitting: Vascular Surgery

## 2023-03-03 DIAGNOSIS — H268 Other specified cataract: Secondary | ICD-10-CM | POA: Diagnosis not present

## 2023-03-03 DIAGNOSIS — Z01818 Encounter for other preprocedural examination: Secondary | ICD-10-CM | POA: Diagnosis not present

## 2023-03-21 DIAGNOSIS — Z008 Encounter for other general examination: Secondary | ICD-10-CM | POA: Diagnosis not present

## 2023-03-21 DIAGNOSIS — N182 Chronic kidney disease, stage 2 (mild): Secondary | ICD-10-CM | POA: Diagnosis not present

## 2023-03-21 DIAGNOSIS — E785 Hyperlipidemia, unspecified: Secondary | ICD-10-CM | POA: Diagnosis not present

## 2023-03-21 DIAGNOSIS — I2510 Atherosclerotic heart disease of native coronary artery without angina pectoris: Secondary | ICD-10-CM | POA: Diagnosis not present

## 2023-03-21 DIAGNOSIS — E1151 Type 2 diabetes mellitus with diabetic peripheral angiopathy without gangrene: Secondary | ICD-10-CM | POA: Diagnosis not present

## 2023-03-21 DIAGNOSIS — Z1211 Encounter for screening for malignant neoplasm of colon: Secondary | ICD-10-CM | POA: Diagnosis not present

## 2023-03-21 DIAGNOSIS — E663 Overweight: Secondary | ICD-10-CM | POA: Diagnosis not present

## 2023-03-21 DIAGNOSIS — E1169 Type 2 diabetes mellitus with other specified complication: Secondary | ICD-10-CM | POA: Diagnosis not present

## 2023-03-21 DIAGNOSIS — I129 Hypertensive chronic kidney disease with stage 1 through stage 4 chronic kidney disease, or unspecified chronic kidney disease: Secondary | ICD-10-CM | POA: Diagnosis not present

## 2023-03-21 DIAGNOSIS — Z6825 Body mass index (BMI) 25.0-25.9, adult: Secondary | ICD-10-CM | POA: Diagnosis not present

## 2023-03-21 DIAGNOSIS — E1136 Type 2 diabetes mellitus with diabetic cataract: Secondary | ICD-10-CM | POA: Diagnosis not present

## 2023-03-21 DIAGNOSIS — F17211 Nicotine dependence, cigarettes, in remission: Secondary | ICD-10-CM | POA: Diagnosis not present

## 2023-03-21 DIAGNOSIS — E1165 Type 2 diabetes mellitus with hyperglycemia: Secondary | ICD-10-CM | POA: Diagnosis not present

## 2023-03-21 DIAGNOSIS — E1122 Type 2 diabetes mellitus with diabetic chronic kidney disease: Secondary | ICD-10-CM | POA: Diagnosis not present

## 2023-03-21 DIAGNOSIS — D8481 Immunodeficiency due to conditions classified elsewhere: Secondary | ICD-10-CM | POA: Diagnosis not present

## 2023-03-23 ENCOUNTER — Encounter (HOSPITAL_BASED_OUTPATIENT_CLINIC_OR_DEPARTMENT_OTHER): Payer: Self-pay | Admitting: Family Medicine

## 2023-04-07 DIAGNOSIS — H5213 Myopia, bilateral: Secondary | ICD-10-CM | POA: Diagnosis not present

## 2023-04-07 DIAGNOSIS — H2511 Age-related nuclear cataract, right eye: Secondary | ICD-10-CM | POA: Diagnosis not present

## 2023-04-07 DIAGNOSIS — H25813 Combined forms of age-related cataract, bilateral: Secondary | ICD-10-CM | POA: Diagnosis not present

## 2023-04-07 DIAGNOSIS — E119 Type 2 diabetes mellitus without complications: Secondary | ICD-10-CM | POA: Diagnosis not present

## 2023-04-07 DIAGNOSIS — H268 Other specified cataract: Secondary | ICD-10-CM | POA: Diagnosis not present

## 2023-04-07 DIAGNOSIS — E1136 Type 2 diabetes mellitus with diabetic cataract: Secondary | ICD-10-CM | POA: Diagnosis not present

## 2023-04-07 DIAGNOSIS — H52203 Unspecified astigmatism, bilateral: Secondary | ICD-10-CM | POA: Diagnosis not present

## 2023-04-07 HISTORY — DX: Other specified cataract: H26.8

## 2023-04-20 DIAGNOSIS — H11153 Pinguecula, bilateral: Secondary | ICD-10-CM | POA: Diagnosis not present

## 2023-04-20 DIAGNOSIS — H25813 Combined forms of age-related cataract, bilateral: Secondary | ICD-10-CM | POA: Diagnosis not present

## 2023-05-04 NOTE — Telephone Encounter (Signed)
 Unable to schedule pt appt.

## 2023-05-25 DIAGNOSIS — H25811 Combined forms of age-related cataract, right eye: Secondary | ICD-10-CM | POA: Diagnosis not present

## 2023-05-25 DIAGNOSIS — H25813 Combined forms of age-related cataract, bilateral: Secondary | ICD-10-CM | POA: Diagnosis not present

## 2023-05-25 DIAGNOSIS — H268 Other specified cataract: Secondary | ICD-10-CM | POA: Diagnosis not present

## 2023-06-05 DIAGNOSIS — E119 Type 2 diabetes mellitus without complications: Secondary | ICD-10-CM | POA: Diagnosis not present

## 2023-06-05 LAB — HEMOGLOBIN A1C: A1c: 7.9

## 2023-06-06 DIAGNOSIS — I739 Peripheral vascular disease, unspecified: Secondary | ICD-10-CM | POA: Diagnosis not present

## 2023-06-06 DIAGNOSIS — H268 Other specified cataract: Secondary | ICD-10-CM | POA: Diagnosis not present

## 2023-06-06 DIAGNOSIS — Z7984 Long term (current) use of oral hypoglycemic drugs: Secondary | ICD-10-CM | POA: Diagnosis not present

## 2023-06-06 DIAGNOSIS — Z87891 Personal history of nicotine dependence: Secondary | ICD-10-CM | POA: Diagnosis not present

## 2023-06-06 DIAGNOSIS — I10 Essential (primary) hypertension: Secondary | ICD-10-CM | POA: Diagnosis not present

## 2023-06-06 DIAGNOSIS — E1136 Type 2 diabetes mellitus with diabetic cataract: Secondary | ICD-10-CM | POA: Diagnosis not present

## 2023-06-06 DIAGNOSIS — Z7982 Long term (current) use of aspirin: Secondary | ICD-10-CM | POA: Diagnosis not present

## 2023-06-07 DIAGNOSIS — E119 Type 2 diabetes mellitus without complications: Secondary | ICD-10-CM | POA: Diagnosis not present

## 2023-06-07 DIAGNOSIS — H52203 Unspecified astigmatism, bilateral: Secondary | ICD-10-CM | POA: Diagnosis not present

## 2023-06-07 DIAGNOSIS — H2511 Age-related nuclear cataract, right eye: Secondary | ICD-10-CM | POA: Diagnosis not present

## 2023-06-07 DIAGNOSIS — Z961 Presence of intraocular lens: Secondary | ICD-10-CM | POA: Diagnosis not present

## 2023-06-07 DIAGNOSIS — Z9842 Cataract extraction status, left eye: Secondary | ICD-10-CM | POA: Diagnosis not present

## 2023-06-07 DIAGNOSIS — H5213 Myopia, bilateral: Secondary | ICD-10-CM | POA: Diagnosis not present

## 2023-06-08 ENCOUNTER — Other Ambulatory Visit (HOSPITAL_BASED_OUTPATIENT_CLINIC_OR_DEPARTMENT_OTHER): Payer: Self-pay | Admitting: *Deleted

## 2023-06-08 MED ORDER — ROSUVASTATIN CALCIUM 40 MG PO TABS: 40.0000 mg | ORAL_TABLET | Freq: Every day | ORAL | 0 refills | Status: DC

## 2023-06-12 ENCOUNTER — Other Ambulatory Visit (HOSPITAL_BASED_OUTPATIENT_CLINIC_OR_DEPARTMENT_OTHER): Payer: Self-pay | Admitting: *Deleted

## 2023-06-12 DIAGNOSIS — I10 Essential (primary) hypertension: Secondary | ICD-10-CM

## 2023-06-12 MED ORDER — LISINOPRIL 40 MG PO TABS: 40.0000 mg | ORAL_TABLET | Freq: Every day | ORAL | 0 refills | Status: DC

## 2023-06-14 ENCOUNTER — Encounter (HOSPITAL_BASED_OUTPATIENT_CLINIC_OR_DEPARTMENT_OTHER): Payer: Self-pay | Admitting: *Deleted

## 2023-06-14 ENCOUNTER — Telehealth (HOSPITAL_BASED_OUTPATIENT_CLINIC_OR_DEPARTMENT_OTHER): Payer: Self-pay | Admitting: *Deleted

## 2023-06-14 NOTE — Telephone Encounter (Signed)
Patient was identified as falling into the [place] Measure - Diabetes.   Patient was: Left voicemail to schedule with primary care provider.   Pt mychart msg also sent needs to schedule a follow up with either provider.

## 2023-06-15 DIAGNOSIS — H2511 Age-related nuclear cataract, right eye: Secondary | ICD-10-CM | POA: Diagnosis not present

## 2023-06-15 DIAGNOSIS — Z9842 Cataract extraction status, left eye: Secondary | ICD-10-CM | POA: Diagnosis not present

## 2023-06-15 DIAGNOSIS — Z961 Presence of intraocular lens: Secondary | ICD-10-CM | POA: Diagnosis not present

## 2023-06-15 DIAGNOSIS — E119 Type 2 diabetes mellitus without complications: Secondary | ICD-10-CM | POA: Diagnosis not present

## 2023-06-15 DIAGNOSIS — H5213 Myopia, bilateral: Secondary | ICD-10-CM | POA: Diagnosis not present

## 2023-06-15 DIAGNOSIS — H52203 Unspecified astigmatism, bilateral: Secondary | ICD-10-CM | POA: Diagnosis not present

## 2023-06-16 ENCOUNTER — Other Ambulatory Visit: Payer: Self-pay | Admitting: Family Medicine

## 2023-06-16 DIAGNOSIS — I10 Essential (primary) hypertension: Secondary | ICD-10-CM

## 2023-06-16 MED ORDER — HYDROCHLOROTHIAZIDE 25 MG PO TABS: 25.0000 mg | ORAL_TABLET | Freq: Every day | ORAL | 0 refills | Status: DC

## 2023-06-16 NOTE — Telephone Encounter (Signed)
Copied from CRM [id number]. Topic: Clinical - Medication Refill >> Jun 16, 2023 10:01 AM Clayton Bibles wrote: Most Recent Primary Care Visit:  Provider: Alyson Reedy  Department: [facility]  Visit Type: NEW PATIENT  Date: 01/18/2023  Medication: hydrochlorothiazide (HYDRODIURIL) 25 MG tablet [[id number]]  Has the patient contacted their pharmacy? Yes (Agent: If no, request that the patient contact the pharmacy for the refill. If patient does not wish to contact the pharmacy document the reason why and proceed with request.) (Agent: If yes, when and what did the pharmacy advise?) Pharmacy needs orders for refill  Is this the correct pharmacy for this prescription? Yes - [facility]  If no, delete pharmacy and type the correct one.  This is the patient's preferred pharmacy:  [facility] [address] ([address] [place] - [address] [place] [address] Phone: [phone] Fax: [phone]  Has the prescription been filled recently? No  Is the patient out of the medication? Yes  Has the patient been seen for an appointment in the last year OR does the patient have an upcoming appointment? Yes - She is making an appointment today  Can we respond through MyChart? No  Agent: Please be advised that Rx refills may take up to 3 business days. We ask that you follow-up with your pharmacy.

## 2023-06-20 ENCOUNTER — Encounter: Payer: Self-pay | Admitting: Family Medicine

## 2023-06-21 ENCOUNTER — Ambulatory Visit (INDEPENDENT_AMBULATORY_CARE_PROVIDER_SITE_OTHER): Payer: Self-pay | Admitting: Family Medicine

## 2023-06-21 ENCOUNTER — Other Ambulatory Visit: Payer: Self-pay

## 2023-06-21 ENCOUNTER — Encounter: Payer: Self-pay | Admitting: Family Medicine

## 2023-06-21 VITALS — BP 160/82 | HR 56 | Temp 97.6°F | Resp 18 | Ht 64.0 in | Wt 147.0 lb

## 2023-06-21 DIAGNOSIS — I10 Essential (primary) hypertension: Secondary | ICD-10-CM

## 2023-06-21 DIAGNOSIS — E785 Hyperlipidemia, unspecified: Secondary | ICD-10-CM

## 2023-06-21 DIAGNOSIS — E1169 Type 2 diabetes mellitus with other specified complication: Secondary | ICD-10-CM

## 2023-06-21 DIAGNOSIS — E118 Type 2 diabetes mellitus with unspecified complications: Secondary | ICD-10-CM

## 2023-06-21 MED ORDER — HYDROCHLOROTHIAZIDE 25 MG PO TABS: 25.0000 mg | ORAL_TABLET | Freq: Every day | ORAL | 3 refills | Status: AC

## 2023-06-21 MED ORDER — ROSUVASTATIN CALCIUM 40 MG PO TABS: 40.0000 mg | ORAL_TABLET | Freq: Every day | ORAL | 3 refills | Status: AC

## 2023-06-21 MED ORDER — AMLODIPINE BESYLATE 10 MG PO TABS: 10.0000 mg | ORAL_TABLET | Freq: Every day | ORAL | 3 refills | Status: AC

## 2023-06-21 NOTE — Patient Instructions (Signed)

## 2023-06-21 NOTE — Assessment & Plan Note (Signed)
 Patient presents today with slightly elevated blood pressure, repeat blood pressure increased. Patient in no acute distress and is well-appearing. Denies chest pain, shortness of breath, lower extremity edema, vision changes, headaches. Cardiovascular exam with heart regular rate and rhythm. Normal heart sounds, no murmurs present. No lower extremity edema present. Lungs clear to auscultation bilaterally. Patient is currently taking amlodipine 10mg , hctz 25mg , lisinopril 40mg , & metoprolol 25mg . Refills provided today for amlodipine & hctz. Advised patient to continue monitoring blood pressure at home and return to office sooner if blood pressure continue to remain elevated greater than 140/90.

## 2023-06-21 NOTE — Progress Notes (Signed)
 Established Patient Office Visit  Subjective  Patient ID: Rebecca [name], female    DOB: 03/30/1956  Age: 68 y.o. MRN: [id number]  Chief Complaint  Patient presents with   Hypertension   Diabetes   HYPERTENSION: Rebecca [name] presents for the medical management of hypertension.  Patient's current hypertension medication regimen is: amlodipine 10mg , hctz 25mg , lisinopril 40mg , metoprolol 25mg   Patient is currently taking prescribed medications for HTN.  Patient is occasionally check on BP at home. About 150/80 Adhering to low sodium diet: yes Exercising Regularly: yes Denies headache, dizziness, CP, SHOB, vision changes.    BP Readings from Last 3 Encounters:  06/21/23 (!) 160/82  01/18/23 (!) 178/82  01/12/23 (!) 163/85   DIABETES: Rebecca [name] presents for the medical management of diabetes.  Medication compliance: yes Denies chest pain, shortness of breath, vision changes, polydipsia, polyphagia, polyuria, open wounds/ulcers on feet.  Denies hypoglycemia.  Patient is adhering to a diabetic diet. Has RN nutritionist every week currently, switching to every other week   Patient is exercising regularly by walking.  She has had 2 hypoglycemic events- usually right before dinner.  Pertinent lab work: A1C: 7.9 A1c last checked on 06/05/2023. Monitoring: blood sugar readings at home: FreeStyle Libre    23% of the time in the acceptable range to 53%         Continue current medication regimen: metformin 1000mg  BID, glyburide 5mg  BID, Farxiga 5mg  QD (currently not taking [place])  She is trying to get [place] covered by insurance- paperwork completed  ASCVD risk: asa 81mg , rosuvastatin 40mg  qd   Eye dr- Rebecca [name]  Mature cataract in L eye- removed 06/06/2023 Having R eye done on 06/28/2023 UTD on DM retinal exam    Lab Results  Component Value Date   HGBA1C 9.0 (A) 01/18/2023    No foot exam found Lab Results  Component Value Date   LABMICR  6,814.4 01/18/2023   LABMICR 898.0 02/25/2021    Wt Readings from Last 3 Encounters:  06/21/23 147 lb (66.7 kg)  01/18/23 146 lb (66.2 kg)  02/25/21 147 lb 12.8 oz (67 kg)   ROS: see HPI    Objective:     BP (!) 160/82 (BP Location: Left Arm, Patient Position: Sitting, Cuff Size: Normal)   Pulse (!) 56   Temp 97.6 F (36.4 C) (Oral)   Resp 18   Ht 5\' 4"  (1.626 m)   Wt 147 lb (66.7 kg)   SpO2 94%   BMI 25.23 kg/m  BP Readings from Last 3 Encounters:  06/21/23 (!) 160/82  01/18/23 (!) 178/82  01/12/23 (!) 163/85     Physical Exam Vitals reviewed.  Constitutional:      Appearance: Normal appearance.  Cardiovascular:     Rate and Rhythm: Normal rate and regular rhythm.     Pulses: Normal pulses.     Heart sounds: Normal heart sounds.  Pulmonary:     Effort: Pulmonary effort is normal.     Breath sounds: Normal breath sounds.  Neurological:     Mental Status: She is alert.  Psychiatric:        Mood and Affect: Mood normal.        Behavior: Behavior normal.     Assessment & Plan:   Essential hypertension Assessment & Plan: Patient presents today with slightly elevated blood pressure, repeat blood pressure increased. Patient in no acute distress and is well-appearing. Denies chest pain, shortness of breath, lower extremity  edema, vision changes, headaches. Cardiovascular exam with heart regular rate and rhythm. Normal heart sounds, no murmurs present. No lower extremity edema present. Lungs clear to auscultation bilaterally. Patient is currently taking amlodipine 10mg , hctz 25mg , lisinopril 40mg , & metoprolol 25mg . Refills provided today for amlodipine & hctz. Advised patient to continue monitoring blood pressure at home and return to office sooner if blood pressure continue to remain elevated greater than 140/90.  Orders: -     amLODIPine Besylate; Take 1 tablet (10 mg total) by mouth daily.  Dispense: 90 tablet; Refill: 3 -     hydroCHLOROthiazide; Take 1 tablet  (25 mg total) by mouth daily.  Dispense: 90 tablet; Refill: 3  DM (diabetes mellitus), type 2 with complications (HCC) Assessment & Plan: Recent hemoglobin A1c at 7.9%, improvement from previous reading of 9.0%. She is currently meeting with a nutritionist weekly to discuss lifestyle modifications to help improve blood sugars. No refills needed at this time. Paperwork completed for coverage for [place]. Advised patient to reach out when she starts [place] 5mg  daily, so that we can start to wean other diabetes medications based on glucose readings. Discussed medication desired effects, potential side effects, and when to return to office. Discussed signs and symptoms of hypoglycemia and need to present to the ED. Patient verbalizes understanding regarding plan of care and all questions answered.    Hyperlipidemia associated with type 2 diabetes mellitus (HCC) Assessment & Plan: Unable to calculate ASCVD risk due to prior NSTEMI. Refill provided for rosuvastatin.   Orders: -     Rosuvastatin Calcium; Take 1 tablet (40 mg total) by mouth daily.  Dispense: 90 tablet; Refill: 3     Return in about 3 months (around 09/18/2023) for HTN follow-up, Diabetes f/u.    Rebecca Reedy, FNP

## 2023-06-21 NOTE — Assessment & Plan Note (Signed)
 Recent hemoglobin A1c at 7.9%, improvement from previous reading of 9.0%. She is currently meeting with a nutritionist weekly to discuss lifestyle modifications to help improve blood sugars. No refills needed at this time. Paperwork completed for coverage for [place]. Advised patient to reach out when she starts [place] 5mg  daily, so that we can start to wean other diabetes medications based on glucose readings. Discussed medication desired effects, potential side effects, and when to return to office. Discussed signs and symptoms of hypoglycemia and need to present to the ED. Patient verbalizes understanding regarding plan of care and all questions answered.

## 2023-06-21 NOTE — Assessment & Plan Note (Signed)
 Unable to calculate ASCVD risk due to prior NSTEMI. Refill provided for rosuvastatin.

## 2023-06-28 DIAGNOSIS — H269 Unspecified cataract: Secondary | ICD-10-CM | POA: Diagnosis not present

## 2023-06-28 DIAGNOSIS — H2511 Age-related nuclear cataract, right eye: Secondary | ICD-10-CM | POA: Diagnosis not present

## 2023-06-28 DIAGNOSIS — H25811 Combined forms of age-related cataract, right eye: Secondary | ICD-10-CM | POA: Diagnosis not present

## 2023-06-29 DIAGNOSIS — Z9841 Cataract extraction status, right eye: Secondary | ICD-10-CM | POA: Diagnosis not present

## 2023-06-29 DIAGNOSIS — Z9842 Cataract extraction status, left eye: Secondary | ICD-10-CM | POA: Diagnosis not present

## 2023-06-29 DIAGNOSIS — Z961 Presence of intraocular lens: Secondary | ICD-10-CM | POA: Diagnosis not present

## 2023-06-29 DIAGNOSIS — E119 Type 2 diabetes mellitus without complications: Secondary | ICD-10-CM | POA: Diagnosis not present

## 2023-06-29 DIAGNOSIS — H5213 Myopia, bilateral: Secondary | ICD-10-CM | POA: Diagnosis not present

## 2023-06-29 DIAGNOSIS — H52203 Unspecified astigmatism, bilateral: Secondary | ICD-10-CM | POA: Diagnosis not present

## 2023-07-04 ENCOUNTER — Other Ambulatory Visit: Payer: Self-pay | Admitting: Family Medicine

## 2023-07-04 DIAGNOSIS — E1169 Type 2 diabetes mellitus with other specified complication: Secondary | ICD-10-CM

## 2023-07-04 NOTE — Telephone Encounter (Signed)
 Last Fill: 06/21/23 LVM advising pt there are refills available at pharmacy.  No further action needed.  Routing to provider for review/authorization.

## 2023-07-04 NOTE — Telephone Encounter (Signed)
 Copied from CRM [id number]. Topic: Clinical - Medication Refill >> Jul 04, 2023 11:49 AM Geroge Baseman wrote: Most Recent Primary Care Visit:  Provider: Alyson Reedy  Department: [facility]  Visit Type: NEW PATIENT  Date: 01/18/2023  Medication: rosuvastatin (CRESTOR) 40 MG tablet   Has the patient contacted their pharmacy? Yes (Agent: If no, request that the patient contact the pharmacy for the refill. If patient does not wish to contact the pharmacy document the reason why and proceed with request.) (Agent: If yes, when and what did the pharmacy advise?)  Is this the correct pharmacy for this prescription? Yes If no, delete pharmacy and type the correct one.  This is the patient's preferred pharmacy:  [facility] [address] ([address]), [place] - [address] [address] [place] ([place]) [place] [address] Phone: [phone] Fax: [phone]     Has the prescription been filled recently? No  Is the patient out of the medication? No  Has the patient been seen for an appointment in the last year OR does the patient have an upcoming appointment? No  Can we respond through MyChart? No  Agent: Please be advised that Rx refills may take up to 3 business days. We ask that you follow-up with your pharmacy.

## 2023-07-10 DIAGNOSIS — Z9842 Cataract extraction status, left eye: Secondary | ICD-10-CM | POA: Diagnosis not present

## 2023-07-10 DIAGNOSIS — Z9841 Cataract extraction status, right eye: Secondary | ICD-10-CM | POA: Diagnosis not present

## 2023-07-10 DIAGNOSIS — E119 Type 2 diabetes mellitus without complications: Secondary | ICD-10-CM | POA: Diagnosis not present

## 2023-07-10 DIAGNOSIS — H5213 Myopia, bilateral: Secondary | ICD-10-CM | POA: Diagnosis not present

## 2023-07-10 DIAGNOSIS — Z961 Presence of intraocular lens: Secondary | ICD-10-CM | POA: Diagnosis not present

## 2023-07-10 DIAGNOSIS — H52203 Unspecified astigmatism, bilateral: Secondary | ICD-10-CM | POA: Diagnosis not present

## 2023-07-13 ENCOUNTER — Telehealth: Payer: Self-pay

## 2023-07-13 NOTE — Telephone Encounter (Signed)
 LVM for patient to call Primary care at [facility] to give Flu Vaccination information, and to see if she is follow Foye Clock to [place], or staying at [facility]

## 2023-07-27 ENCOUNTER — Other Ambulatory Visit: Payer: Self-pay

## 2023-08-14 ENCOUNTER — Encounter: Payer: Self-pay | Admitting: *Deleted

## 2023-08-14 DIAGNOSIS — H5213 Myopia, bilateral: Secondary | ICD-10-CM | POA: Diagnosis not present

## 2023-08-14 DIAGNOSIS — Z9842 Cataract extraction status, left eye: Secondary | ICD-10-CM | POA: Diagnosis not present

## 2023-08-14 DIAGNOSIS — Z961 Presence of intraocular lens: Secondary | ICD-10-CM | POA: Diagnosis not present

## 2023-08-14 DIAGNOSIS — E119 Type 2 diabetes mellitus without complications: Secondary | ICD-10-CM | POA: Diagnosis not present

## 2023-08-14 DIAGNOSIS — Z9841 Cataract extraction status, right eye: Secondary | ICD-10-CM | POA: Diagnosis not present

## 2023-08-14 DIAGNOSIS — H52203 Unspecified astigmatism, bilateral: Secondary | ICD-10-CM | POA: Diagnosis not present

## 2023-08-30 ENCOUNTER — Other Ambulatory Visit (HOSPITAL_BASED_OUTPATIENT_CLINIC_OR_DEPARTMENT_OTHER): Payer: Self-pay | Admitting: *Deleted

## 2023-09-18 ENCOUNTER — Ambulatory Visit (INDEPENDENT_AMBULATORY_CARE_PROVIDER_SITE_OTHER): Payer: Self-pay | Admitting: Family Medicine

## 2023-09-18 ENCOUNTER — Encounter: Payer: Self-pay | Admitting: Family Medicine

## 2023-09-18 VITALS — BP 113/71 | HR 54 | Ht 64.0 in | Wt 143.8 lb

## 2023-09-18 DIAGNOSIS — R82998 Other abnormal findings in urine: Secondary | ICD-10-CM | POA: Diagnosis not present

## 2023-09-18 DIAGNOSIS — B351 Tinea unguium: Secondary | ICD-10-CM | POA: Diagnosis not present

## 2023-09-18 DIAGNOSIS — E118 Type 2 diabetes mellitus with unspecified complications: Secondary | ICD-10-CM

## 2023-09-18 DIAGNOSIS — E785 Hyperlipidemia, unspecified: Secondary | ICD-10-CM | POA: Diagnosis not present

## 2023-09-18 DIAGNOSIS — I10 Essential (primary) hypertension: Secondary | ICD-10-CM | POA: Diagnosis not present

## 2023-09-18 DIAGNOSIS — E1169 Type 2 diabetes mellitus with other specified complication: Secondary | ICD-10-CM

## 2023-09-18 DIAGNOSIS — I739 Peripheral vascular disease, unspecified: Secondary | ICD-10-CM

## 2023-09-18 LAB — POCT URINALYSIS DIP (CLINITEK)
Bilirubin, UA: NEGATIVE
Blood, UA: NEGATIVE
Glucose, UA: 500 mg/dL — AB
Ketones, POC UA: NEGATIVE mg/dL
Leukocytes, UA: NEGATIVE
Nitrite, UA: NEGATIVE
POC PROTEIN,UA: >=300 — AB
Spec Grav, UA: 1.02 (ref 1.010–1.025)
Urobilinogen, UA: 0.2 U/dL
pH, UA: 5.5 (ref 5.0–8.0)

## 2023-09-18 NOTE — Patient Instructions (Signed)

## 2023-09-18 NOTE — Progress Notes (Signed)
 Established Patient Office Visit  Subjective  Patient ID: Rebecca [name], female    DOB: 10/30/1955  Age: 68 y.o. MRN: [id number]  Chief Complaint  Patient presents with   Medical Management of Chronic Issues    Patient has been having problems with her legs and states she needs to be seen by vascular. Also states that her feet have been cold and also has been having problems with her toenails and needs a referral to podiatry.   VASCULAR ISSUES:  R leg- pain with ambulation occurring more frequently  History of peripheral vascular intervention   POSSIBLE TOENAIL FUNGUS: R toenail: discoloration and reports it appears like another nail is growing   HYPERTENSION: Rebecca [name] presents for the medical management of hypertension.  Patient's current hypertension medication regimen is: amlodipine  10mg  daily, lisinopril  40mg  daily, hydrochlorothiazide  25mg  daily.  Patient is currently taking prescribed medications for HTN.  Patient is regularly keeping a check on BP at home. Home BP readings- highest have been 150. Usually around 130-140/80s  Adhering to low sodium diet: yes Exercising Regularly: yes, daily walking- toenail is affecting her walking d/t pain.  Denies headache, dizziness, CP, SHOB, vision changes.    BP Readings from Last 3 Encounters:  09/18/23 113/71  06/21/23 (!) 160/82  01/18/23 (!) 178/82   DIABETES: Medication compliance: Farxiga  5mg  daily & glyburide  5mg  BID  Denies chest pain, shortness of breath, vision changes, polydipsia, polyphagia, polyuria, open wounds/ulcers on feet.  Denies hypoglycemia.  Patient is adhering to a diabetic diet.  Patient is exercising regularly.  Pertinent lab work: A1C: 7.9% A1c last checked on 06/05/2023 Will recheck today Monitoring: blood sugar readings at home: high all night Snack at 11pm- 216 throughout the night  Took meds this morning at 7AM and is fasting today- BG 219 right now  Rebecca [name]  Noticing spikes in blood  sugar with Farxiga  and coffee She is working alongside a nutritionist with [employer]   Lab Results  Component Value Date   HGBA1C 9.0 (A) 01/18/2023    No foot exam found Lab Results  Component Value Date   LABMICR 6,814.4 01/18/2023   LABMICR 898.0 02/25/2021    Wt Readings from Last 3 Encounters:  09/18/23 143 lb 12.8 oz (65.2 kg)  06/21/23 147 lb (66.7 kg)  01/18/23 146 lb (66.2 kg)   ROS: see HPI     Objective:      BP 113/71   Pulse (!) 54   Ht 5\' 4"  (1.626 m)   Wt 143 lb 12.8 oz (65.2 kg)   SpO2 96%   BMI 24.68 kg/m  BP Readings from Last 3 Encounters:  09/18/23 113/71  06/21/23 (!) 160/82  01/18/23 (!) 178/82     Physical Exam Constitutional:      Appearance: Normal appearance.  Cardiovascular:     Rate and Rhythm: Normal rate and regular rhythm.     Pulses:          Radial pulses are 2+ on the right side and 2+ on the left side.       Dorsalis pedis pulses are 2+ on the right side and 2+ on the left side.       Posterior tibial pulses are 2+ on the right side and 2+ on the left side.     Heart sounds: Normal heart sounds.  Pulmonary:     Effort: Pulmonary effort is normal.     Breath sounds: Normal breath sounds.  Musculoskeletal:  Right lower leg: No edema.     Left lower leg: No edema.  Neurological:     Mental Status: She is alert.  Psychiatric:        Mood and Affect: Mood normal.        Behavior: Behavior normal.       Assessment & Plan:   1. DM (diabetes mellitus), type 2 with complications (HCC) (Primary) Last hemoglobin A1c completed on 06/05/2023 with result of 7.9%. Plan to recheck today. Reviewed blood sugar readings at home. Patient reports her fasting blood sugar today is 219 mg/dL and reports having a late snack last night of crackers and peanut butter. She is currently working with a nutritionist to help her choose diabetic friendly foods. Discussed continuing to monitor fasting blood sugar with FreeStyle Libre and would  ideally prefer to have fasting blood sugar be around 80-130 mg/dL. Provided patient with 2 sensors. Discussed increasing Farxiga  from 5mg  daily to 10mg  daily. Will obtain kidney function and urinalysis before increasing dose at this time. Patient verbalizes understanding regarding plan of care and all questions answered.  - Hemoglobin A1c  2. Essential hypertension Patient presents today with slightly elevated blood pressure, repeat blood pressure is well-controlled. Patient in no acute distress and is well-appearing. Denies chest pain, shortness of breath, lower extremity edema, vision changes, headaches. Cardiovascular exam with heart regular rate and rhythm. Normal heart sounds, no murmurs present. No lower extremity edema present. Lungs clear to auscultation bilaterally. Patient is currently taking amlodipine  10mg  daily, lisinopril  40mg  daily, & hydrochlorothiazide  25mg  daily. No refills needed. Advised patient to monitor blood pressure occasionally. Return to office sooner if blood pressure begins to increase greater than 130/80.  - Basic Metabolic Panel (BMET)  3. Hyperlipidemia associated with type 2 diabetes mellitus (HCC) Will checking fasting lipid panel today. Continue taking asa 81mg  daily & rosuvastatin  40mg  daily.  - Hemoglobin A1c - Lipid panel  4. Toenail fungus Patient reports multiple discolored toenails and would like to have some removed due to pain. She reports on her R great toe, there is a toenail that is growing underneath her current toenail, causing pain. Most likely needs toenail removal and possible toenail trimming.  - Ambulatory referral to Podiatry  5. PAD (peripheral artery disease) (HCC) Review of chart-patient had formal-popliteal bypass graft in her left leg in 2020 and had an abdominal aortogram with lower extremity vascular intervention in 2022.  Patient presents today with right sided claudication.  She reports the pain resolves when she stops ambulating.   Normal pulses in lower extremities. No unilateral swelling, redness and warmth present. Due to her significant past medical history, urgent referral placed to vascular surgery. - Ambulatory referral to Vascular Surgery  6. Foamy urine Patient presents today with reports of foamy urine.  She denies urinary symptoms including urinary urgency, frequency, dysuria, abdominal pain, fever/chills, abdominal pain, vaginal itching and vaginal discharge.  Reports that she has been staying hydrated.  Will perform POCT UA.  Results with excessive glucose and protein in urine. Placed order for microalbumin, but unable to obtain due to lack of enough sample. Will obtain at next visit. Will also obtain BMP to check kidney function.  - POCT URINALYSIS DIP (CLINITEK) - Urine Culture - Basic Metabolic Panel (BMET)  Return in about 3 months (around 12/19/2023) for Physical.    Wilhelmena Hanson, FNP

## 2023-09-19 ENCOUNTER — Ambulatory Visit (INDEPENDENT_AMBULATORY_CARE_PROVIDER_SITE_OTHER): Payer: Self-pay | Admitting: Podiatry

## 2023-09-19 DIAGNOSIS — L600 Ingrowing nail: Secondary | ICD-10-CM | POA: Diagnosis not present

## 2023-09-19 DIAGNOSIS — M79674 Pain in right toe(s): Secondary | ICD-10-CM | POA: Diagnosis not present

## 2023-09-19 DIAGNOSIS — M79675 Pain in left toe(s): Secondary | ICD-10-CM | POA: Diagnosis not present

## 2023-09-19 DIAGNOSIS — I739 Peripheral vascular disease, unspecified: Secondary | ICD-10-CM

## 2023-09-19 DIAGNOSIS — B351 Tinea unguium: Secondary | ICD-10-CM | POA: Diagnosis not present

## 2023-09-19 LAB — URINE CULTURE

## 2023-09-19 MED ORDER — MUPIROCIN 2 % EX OINT: 1.0000 | TOPICAL_OINTMENT | Freq: Two times a day (BID) | CUTANEOUS | 2 refills | Status: DC

## 2023-09-19 MED ORDER — DOXYCYCLINE HYCLATE 100 MG PO TABS: 100.0000 mg | ORAL_TABLET | Freq: Two times a day (BID) | ORAL | 0 refills | Status: DC

## 2023-09-19 NOTE — Patient Instructions (Signed)
 Wash toe with soap and water, dry well.  Apply small amount of Pearson ointment to the nail corners daily. Monitor for any signs/symptoms of infection. Call the office immediately if any occur or go directly to the emergency room. Call with any questions/concerns.

## 2023-09-20 ENCOUNTER — Ambulatory Visit: Payer: Self-pay | Admitting: Family Medicine

## 2023-09-20 ENCOUNTER — Other Ambulatory Visit: Payer: Self-pay

## 2023-09-20 ENCOUNTER — Other Ambulatory Visit: Payer: Self-pay | Admitting: Family Medicine

## 2023-09-20 DIAGNOSIS — N1832 Chronic kidney disease, stage 3b: Secondary | ICD-10-CM

## 2023-09-20 DIAGNOSIS — I10 Essential (primary) hypertension: Secondary | ICD-10-CM

## 2023-09-20 DIAGNOSIS — E781 Pure hyperglyceridemia: Secondary | ICD-10-CM

## 2023-09-20 DIAGNOSIS — E1169 Type 2 diabetes mellitus with other specified complication: Secondary | ICD-10-CM

## 2023-09-20 MED ORDER — LISINOPRIL 40 MG PO TABS: 40.0000 mg | ORAL_TABLET | Freq: Every day | ORAL | 0 refills | Status: DC

## 2023-09-21 LAB — MICROALBUMIN / CREATININE URINE RATIO

## 2023-09-21 LAB — HEMOGLOBIN A1C
Est. average glucose Bld gHb Est-mCnc: 148 mg/dL
Hgb A1c MFr Bld: 6.8 % — ABNORMAL HIGH (ref 4.8–5.6)

## 2023-09-21 LAB — BASIC METABOLIC PANEL WITH GFR
BUN/Creatinine Ratio: 24 (ref 12–28)
BUN: 33 mg/dL — ABNORMAL HIGH (ref 8–27)
CO2: 24 mmol/L (ref 20–29)
Calcium: 8.7 mg/dL (ref 8.7–10.3)
Chloride: 101 mmol/L (ref 96–106)
Creatinine, Ser: 1.39 mg/dL — ABNORMAL HIGH (ref 0.57–1.00)
Glucose: 202 mg/dL — ABNORMAL HIGH (ref 70–99)
Potassium: 4.1 mmol/L (ref 3.5–5.2)
Sodium: 139 mmol/L (ref 134–144)
eGFR: 42 mL/min/{1.73_m2} — ABNORMAL LOW (ref >59)

## 2023-09-21 LAB — LIPID PANEL
Chol/HDL Ratio: 6.6 ratio — ABNORMAL HIGH (ref 0.0–4.4)
Cholesterol, Total: 172 mg/dL (ref 100–199)
HDL: 26 mg/dL — ABNORMAL LOW (ref >39)
LDL Chol Calc (NIH): 61 mg/dL (ref 0–99)
Triglycerides: 562 mg/dL (ref 0–149)
VLDL Cholesterol Cal: 85 mg/dL — ABNORMAL HIGH (ref 5–40)

## 2023-09-21 NOTE — Progress Notes (Signed)
  Subjective:  Patient ID: Rebecca [name], female    DOB: December 16, 1955,  MRN: [id number]  Chief Complaint  Patient presents with   Nail Problem    RM#14 Patient states has concerns with bilateral big toe nails she is a diabetic and needs nail trim.    Discussed the use of AI scribe software for clinical note transcription with the patient, who gave verbal consent to proceed.  History of Present Illness Rebecca [name] is a 68 year old female with diabetes who presents with concerns about toenail abnormalities and foot pain.  She has experienced abnormal growth in her big toenails over the past six months, with one resembling a 'torn piece of paper' and another growing off to the side with visible ridges. Intermittent pains around the toenails are present, described as 'growing pains', without drainage or fluid.  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 She underwent angioplasty and stent placement in 2022 due to circulation issues, and a bypass was performed on her left leg in 2020 due to a blockage behind the knee. Her right leg feels tired when walking, indicating poor circulation.  Her A1c has decreased to 7.9. She is currently on Farxiga  for diabetes management. No numbness or tingling in her feet, but there are decreased pulses and skin discoloration on her feet.      Objective:    Physical Exam General: AAO x3, NAD  Dermatological: Toenails are hypertrophic, dystrophic with yellow, brown discoloration.  On the the right medial hallux nail border there is localized drainage on the distal portion of the toenail.  There is no significant erythema there is no ascending cellulitis.  No fluctuation or crepitation.   Vascular: Dorsalis Pedis artery and Posterior Tibial artery pedal pulses are decreased bilateral. There is no pain with calf compression, swelling, warmth, erythema.   Neruologic:  Grossly intact via light touch bilateral.  Musculoskeletal: Digital contractures present.  Gait: Unassisted, Nonantalgic.     No images are attached to the encounter.    Results   LABS A1c: 7.9    Assessment:   1. PAD (peripheral artery disease) (HCC)   2. Dermatophytosis of nail   3. Pain in toes of both feet   4. Ingrown toenail      Plan:  Patient was evaluated and treated and all questions answered.  Assessment and Plan Assessment & Plan Right toenail infection - Prescribed oral antibiotic-doxycycline  - Prescribed topical antibiotic ointment, mupirocin, for application around the right big toe. - Instructed to wash area daily with soap and water, dry thoroughly, and apply antibiotic ointment. - Monitor for increased drainage, redness, or discoloration. - Follow up in two weeks to reassess.  Symptomatic onychomycosis -Sharply debrided nails x 10 without any complications. - Schedule regular nail trimming and filing every couple of months.  Peripheral vascular disease Peripheral vascular disease with worsening symptoms in the right leg, indicating poor circulation. - Refer to vascular specialist for evaluation of worsening right leg symptoms.    Return in about 2 weeks (around 10/03/2023) for ingrown toenail/PAD.   Charity Conch DPM

## 2023-09-22 DIAGNOSIS — N1832 Chronic kidney disease, stage 3b: Secondary | ICD-10-CM | POA: Insufficient documentation

## 2023-09-22 DIAGNOSIS — E781 Pure hyperglyceridemia: Secondary | ICD-10-CM | POA: Insufficient documentation

## 2023-10-02 ENCOUNTER — Other Ambulatory Visit: Payer: Self-pay | Admitting: *Deleted

## 2023-10-02 DIAGNOSIS — I739 Peripheral vascular disease, unspecified: Secondary | ICD-10-CM

## 2023-10-03 ENCOUNTER — Encounter: Payer: Self-pay | Admitting: Podiatry

## 2023-10-03 ENCOUNTER — Ambulatory Visit (INDEPENDENT_AMBULATORY_CARE_PROVIDER_SITE_OTHER): Admitting: Podiatry

## 2023-10-03 DIAGNOSIS — I739 Peripheral vascular disease, unspecified: Secondary | ICD-10-CM

## 2023-10-03 DIAGNOSIS — L600 Ingrowing nail: Secondary | ICD-10-CM | POA: Diagnosis not present

## 2023-10-03 NOTE — Progress Notes (Signed)
 Subjective: Chief Complaint  Patient presents with   Ingrown Toenail    RM#11 Follow up on last visit bilateral ingrown nails and nail fungus.   68 year old female presents the office the above concerns.  She states she has the course of doxycycline .  She is not seeing any drainage or pus but difficult to refer to see as she tries a picture on her phone.  She did get an appointment to see vascular surgery.  She does not report any fevers or chills. No pain currently.   Objective: AAO x3, NAD DP/PT pulses palpable bilaterally, CRT less than 3 seconds Nails remain unchanged as far as the color and thickening.  The area of concern along the right medial hallux nail border there is very minimal drainage noted today.  Once I debrided this there is no further drainage or purulence noted.  There is no erythema, ascending cellulitis.  No fluctuation or crepitation. No pain with calf compression, swelling, warmth, erythema  Assessment: Ingrown toenail, PAD  Plan: -All treatment options discussed with the patient including all alternatives, risks, complications.  -I did further debride the nails and complications of bleeding.  Recommend continue antibiotic ointment dressing changes daily.  Monitor for any signs or symptoms of infection or reoccurrence.  Follow-up with vascular surgery. -Patient encouraged to call the office with any questions, concerns, change in symptoms.

## 2023-10-16 ENCOUNTER — Ambulatory Visit (HOSPITAL_COMMUNITY)
Admission: RE | Admit: 2023-10-16 | Discharge: 2023-10-16 | Disposition: A | Discharge status: Home / Self Care | Source: Ambulatory Visit | Attending: Surgery | Admitting: Surgery

## 2023-10-16 DIAGNOSIS — I739 Peripheral vascular disease, unspecified: Secondary | ICD-10-CM | POA: Diagnosis not present

## 2023-10-17 LAB — VAS US ABI WITH/WO TBI
Left ABI: 0.88
Right ABI: 0.36

## 2023-10-31 ENCOUNTER — Ambulatory Visit: Attending: Vascular Surgery | Admitting: Vascular Surgery

## 2023-10-31 ENCOUNTER — Encounter: Payer: Self-pay | Admitting: Vascular Surgery

## 2023-10-31 ENCOUNTER — Other Ambulatory Visit: Payer: Self-pay

## 2023-10-31 VITALS — BP 121/66 | HR 52 | Temp 98.0°F | Resp 18 | Ht 64.0 in | Wt 137.9 lb

## 2023-10-31 DIAGNOSIS — I70211 Atherosclerosis of native arteries of extremities with intermittent claudication, right leg: Secondary | ICD-10-CM

## 2023-10-31 DIAGNOSIS — I739 Peripheral vascular disease, unspecified: Secondary | ICD-10-CM

## 2023-10-31 NOTE — Progress Notes (Signed)
 Office Note     CC:  follow up Requesting Provider:  Gershon Donnice SAUNDERS, DPM  HPI: Rebecca [name] is a 68 y.o. (12-11-1955) female who presents for follow up of peripheral artery disease. She most recently in on 06/17/20 underwent Aortogram, RLE arteriogram with SFA to mid popliteal artery stenting, TPT trunk and peroneal stent with intraarterial injection of nitro. This was performed due to ischemic rest pain of the right lower extremity. She additionally has history of a left femoral to below knee popliteal artery bypass with  translocated non reversed GSV on 09/20/18 by Dr.Early. This was performed secondary to rest pain and tissue loss.   At her follow up in March her symptoms  were resolved in both lower extremities.  She has been maintained on Aspirin , Plavix  and Statin. Her duplex at the time showed some stenosis in proximal stents of the RLE so she was scheduled to return for closer interval follow up   She returns today for 3 month follow up with non invasive studies. She denies any limiting claudication, rest pain or tissue loss. She explains that if she is up on her legs all day long walking around at work she gets some cramping / throbbing discomfort in her left calf. This is quickly relieved with rest. She continues to stay on Aspirin , statin and Plavix .   The pt is on a statin for cholesterol management.  The pt is on a daily aspirin .   Other AC:  Plavix  The pt is on CCB, BB, ACE, HCTZ for hypertension.   The pt is diabetic.   Tobacco hx:  Former  10/31/23: Patient returns to clinic after being lost to follow-up for several years.  I previously done a multilevel intervention on the right lower extremity for ischemic rest pain.  Patient reports development of disabling claudication symptoms around Christmas time of this year.  Symptoms have not improved.  She has been under the care of Dr. Gershon of [facility].  She reports a small ulcer on her right great toe which has  resolved with local wound care.  She would like to undergo intervention.  Past Medical History:  Diagnosis Date   Allergy 1964   Anxiety    CAD (coronary artery disease)    Depression    Diabetes mellitus without complication (HCC)    DM (diabetes mellitus), type 2, uncontrolled with complications 04/06/2012   Hypertension    Hypertensive emergency 11/14/2012   Hypokalemia 11/14/2012   Hyponatremia 04/08/2012   Mature cataract 04/07/2023   Mixed hyperlipidemia 04/06/2012   NSTEMI (non-ST elevated myocardial infarction) (HCC) 11/14/2012   Tobacco abuse 04/06/2012    Past Surgical History:  Procedure Laterality Date   ABDOMINAL AORTOGRAM W/LOWER EXTREMITY N/A 06/17/2020   Procedure: ABDOMINAL AORTOGRAM W/LOWER EXTREMITY;  Surgeon: Magda Debby SAILOR, MD;  Location: [facility];  Service: Cardiovascular;  Laterality: N/A;   FEMORAL-POPLITEAL BYPASS GRAFT Left 09/20/2018   Procedure: Left  FEMORAL-BELOW KNEE POPLITEAL ARTERY Bypass with Translocated nonreversed saphenous vein;  Surgeon: Oris Krystal FALCON, MD;  Location: [facility] OR;  Service: Vascular;  Laterality: Left;   FRACTURE SURGERY  1967   LEFT HEART CATHETERIZATION WITH CORONARY ANGIOGRAM N/A 11/14/2012   Procedure: LEFT HEART CATHETERIZATION WITH CORONARY ANGIOGRAM;  Surgeon: Erick SAUNDERS Bergamo, MD;  Location: [facility] CATH LAB;  Service: Cardiovascular;  Laterality: N/A;   LOWER EXTREMITY ANGIOGRAPHY N/A 09/05/2018   Procedure: LOWER EXTREMITY ANGIOGRAPHY;  Surgeon: Serene Gaile ORN, MD;  Location: [facility];  Service: Cardiovascular;  Laterality: N/A;   PERCUTANEOUS CORONARY STENT INTERVENTION (PCI-S)  11/14/2012   Procedure: PERCUTANEOUS CORONARY STENT INTERVENTION (PCI-S);  Surgeon: Erick JONELLE Bergamo, MD;  Location: [facility] CATH LAB;  Service: Cardiovascular;;   PERIPHERAL VASCULAR INTERVENTION Right 06/17/2020   Procedure: PERIPHERAL VASCULAR INTERVENTION;  Surgeon: Magda Debby SAILOR, MD;  Location: [facility];  Service:  Cardiovascular;  Laterality: Right;  Femoral/Popliteal    SPINE SURGERY     TONSILLECTOMY      Social History   Socioeconomic History   Marital status: Single    Spouse name: Not on file   Number of children: Not on file   Years of education: Not on file   Highest education level: Associate degree: occupational, Scientist, product/process development, or vocational program  Occupational History   Not on file  Tobacco Use   Smoking status: Former    Current packs/day: 0.00    Types: Cigarettes    Quit date: 05/22/2017    Years since quitting: 6.4    Passive exposure: Never   Smokeless tobacco: Never  Vaping Use   Vaping status: Never Used  Substance and Sexual Activity   Alcohol use: Yes    Comment: occ   Drug use: No   Sexual activity: Not Currently    Birth control/protection: None  Other Topics Concern   Not on file  Social History Narrative   Not on file   Social Drivers of Health   Financial Resource Strain: Medium Risk (01/18/2023)   Overall Financial Resource Strain (CARDIA)    Difficulty of Paying Living Expenses: Somewhat hard  Food Insecurity: Food Insecurity Present (01/18/2023)   Hunger Vital Sign    Worried About Running Out of Food in the Last Year: Sometimes true    Ran Out of Food in the Last Year: Sometimes true  Transportation Needs: No Transportation Needs (01/18/2023)   PRAPARE - Administrator, Civil Service (Medical): No    Lack of Transportation (Non-Medical): No  Physical Activity: Sufficiently Active (01/18/2023)   Exercise Vital Sign    Days of Exercise per Week: 5 days    Minutes of Exercise per Session: 60 min  Stress: Stress Concern Present (01/18/2023)   [employer] of Occupational Health - Occupational Stress Questionnaire    Feeling of Stress : Rather much  Social Connections: Moderately Integrated (01/18/2023)   Social Connection and Isolation Panel    Frequency of Communication with Friends and Family: Once a week    Frequency of Social  Gatherings with Friends and Family: Three times a week    Attends Religious Services: 1 to 4 times per year    Active Member of Clubs or Organizations: Yes    Attends Engineer, structural: More than 4 times per year    Marital Status: Never married  Catering manager Violence: Not on file    Family History  Problem Relation Age of Onset   Diabetes Brother    Heart disease Brother    Arthritis Brother    Stroke Mother    Diabetes Father    Heart disease Father     Current Outpatient Medications  Medication Sig Dispense Refill   amLODipine  (NORVASC ) 10 MG tablet Take 1 tablet (10 mg total) by mouth daily. 90 tablet 3   aspirin  EC 81 MG tablet Take 81 mg by mouth daily.     cholecalciferol (VITAMIN D3) 25 MCG (1000 UNIT) tablet Take 1,000 Units by mouth daily.  Continuous Glucose Receiver (FREESTYLE LIBRE 3 READER) DEVI USE AS DIRECTED TO MONITOR BLOOD SUGAR     Continuous Glucose Sensor (FREESTYLE LIBRE 3 PLUS SENSOR) MISC APPLY TO THE SKIN EVERY 15 DAYS AS DIRECTED TO MONITOR BLOOD GLUCOSE CONTINUOUSLY     dapagliflozin  propanediol (FARXIGA ) 5 MG TABS tablet Take 1 tablet (5 mg total) by mouth daily before breakfast. 30 tablet 2   doxycycline  (VIBRA -TABS) 100 MG tablet Take 1 tablet (100 mg total) by mouth 2 (two) times daily. 20 tablet 0   glyBURIDE  (DIABETA ) 5 MG tablet Take 5 mg by mouth 2 (two) times daily with a meal.     hydrochlorothiazide  (HYDRODIURIL ) 25 MG tablet Take 1 tablet (25 mg total) by mouth daily. 90 tablet 3   lisinopril  (ZESTRIL ) 40 MG tablet Take 1 tablet (40 mg total) by mouth daily. 90 tablet 0   MAGNESIUM  PO Take 1 tablet by mouth daily.     metoprolol  tartrate (LOPRESSOR ) 25 MG tablet Take 1 tablet (25 mg total) by mouth 2 (two) times daily. 180 tablet 0   mupirocin  ointment (BACTROBAN ) 2 % Apply 1 Application topically 2 (two) times daily. 30 g 2   rosuvastatin  (CRESTOR ) 40 MG tablet Take 1 tablet (40 mg total) by mouth daily. 90 tablet 3   No  current facility-administered medications for this visit.    Allergies  Allergen Reactions   Penicillins Rash and Other (See Comments)    Did it involve swelling of the face/tongue/throat, SOB, or low BP? No Did it involve sudden or SEVERE rash/hives, skin peeling, or any reaction on the inside of your mouth or nose? #  #  #  YES  #  #  #  Did you need to seek medical attention at a hospital or doctor's office? #  #  #  YES  #  #  #  When did it last happen? Unknown   PHYSICAL EXAMINATION:  Vitals:   10/31/23 0821  BP: 121/66  Pulse: (!) 52  Resp: 18  Temp: 98 F (36.7 C)  TempSrc: Temporal  SpO2: 97%  Weight: 137 lb 14.4 oz (62.6 kg)  Height: 5' 4 (1.626 m)    Elderly woman in no distress Regular rate and rhythm Unlabored breathing  No palpable pedal pulses Right foot with dependent rubor Onychomycosis of the right great toe without discrete ulcer   Non-Invasive Vascular Imaging:    +-------+-----------+-----------+------------+------------+  ABI/TBIToday's ABIToday's TBIPrevious ABIPrevious TBI  +-------+-----------+-----------+------------+------------+  Right 0.36       0.04       1.01        0.49          +-------+-----------+-----------+------------+------------+  Left  0.88       0.31       1.01        0.53          +-------+-----------+-----------+------------+------------+   Bilateral lower extremity arterial duplex  Right: 30-49% stenosis noted in the common femoral artery. Hypoechoic  plaque noted throughout the right stents. 50-99% restenosis of the SFA  stent (high-end of range). Patent popliteal artery stent. 50-99%  restenosis of the tibioperoneal trunk stent.   Left: Widely patent left femoral-popliteal artery bypass graft without  evidence of stenosis.   ASSESSMENT/PLAN::  Rebecca [name] is a 68 y.o. female with atherosclerosis of native and stented arteries of right lower extremity causing intermittent  claudication.  Patient counseled patients with asymptomatic peripheral arterial disease or claudication have a 1-2% risk of developing  chronic limb threatening ischemia, but a 15-30% risk of mortality in the next 5 years. Intervention should only be considered for medically optimized patients with disabling symptoms.   The patient is medically optimized.  She is not smoking.  She is compliant with best medical therapy.  Recommend:  Abstinence from all tobacco products. Blood glucose control with goal A1c < 7%. Blood pressure control with goal blood pressure < 130/80 mmHg. Lipid reduction therapy with goal LDL-C < 55 mg/dL. Aspirin  81mg  by mouth daily. Atorvastatin  40-80mg  PO QD (or other high intensity statin therapy).  Plan aortogram, right lower extremity angiogram with possible intervention via left common femoral approach in cath lab.    Debby SAILOR. Magda, MD [facility] Phone Number: [phone] 10/31/2023 8:38 AM

## 2023-10-31 NOTE — H&P (View-Only) (Signed)
 Office Note     CC:  follow up Requesting Provider:  Gershon Donnice SAUNDERS, DPM  HPI: Rebecca [name] is a 68 y.o. (12-11-1955) female who presents for follow up of peripheral artery disease. She most recently in on 06/17/20 underwent Aortogram, RLE arteriogram with SFA to mid popliteal artery stenting, TPT trunk and peroneal stent with intraarterial injection of nitro. This was performed due to ischemic rest pain of the right lower extremity. She additionally has history of a left femoral to below knee popliteal artery bypass with  translocated non reversed GSV on 09/20/18 by Dr.Early. This was performed secondary to rest pain and tissue loss.   At her follow up in March her symptoms  were resolved in both lower extremities.  She has been maintained on Aspirin , Plavix  and Statin. Her duplex at the time showed some stenosis in proximal stents of the RLE so she was scheduled to return for closer interval follow up   She returns today for 3 month follow up with non invasive studies. She denies any limiting claudication, rest pain or tissue loss. She explains that if she is up on her legs all day long walking around at work she gets some cramping / throbbing discomfort in her left calf. This is quickly relieved with rest. She continues to stay on Aspirin , statin and Plavix .   The pt is on a statin for cholesterol management.  The pt is on a daily aspirin .   Other AC:  Plavix  The pt is on CCB, BB, ACE, HCTZ for hypertension.   The pt is diabetic.   Tobacco hx:  Former  10/31/23: Patient returns to clinic after being lost to follow-up for several years.  I previously done a multilevel intervention on the right lower extremity for ischemic rest pain.  Patient reports development of disabling claudication symptoms around Christmas time of this year.  Symptoms have not improved.  She has been under the care of Dr. Gershon of [facility].  She reports a small ulcer on her right great toe which has  resolved with local wound care.  She would like to undergo intervention.  Past Medical History:  Diagnosis Date   Allergy 1964   Anxiety    CAD (coronary artery disease)    Depression    Diabetes mellitus without complication (HCC)    DM (diabetes mellitus), type 2, uncontrolled with complications 04/06/2012   Hypertension    Hypertensive emergency 11/14/2012   Hypokalemia 11/14/2012   Hyponatremia 04/08/2012   Mature cataract 04/07/2023   Mixed hyperlipidemia 04/06/2012   NSTEMI (non-ST elevated myocardial infarction) (HCC) 11/14/2012   Tobacco abuse 04/06/2012    Past Surgical History:  Procedure Laterality Date   ABDOMINAL AORTOGRAM W/LOWER EXTREMITY N/A 06/17/2020   Procedure: ABDOMINAL AORTOGRAM W/LOWER EXTREMITY;  Surgeon: Magda Debby SAILOR, MD;  Location: [facility];  Service: Cardiovascular;  Laterality: N/A;   FEMORAL-POPLITEAL BYPASS GRAFT Left 09/20/2018   Procedure: Left  FEMORAL-BELOW KNEE POPLITEAL ARTERY Bypass with Translocated nonreversed saphenous vein;  Surgeon: Oris Krystal FALCON, MD;  Location: [facility] OR;  Service: Vascular;  Laterality: Left;   FRACTURE SURGERY  1967   LEFT HEART CATHETERIZATION WITH CORONARY ANGIOGRAM N/A 11/14/2012   Procedure: LEFT HEART CATHETERIZATION WITH CORONARY ANGIOGRAM;  Surgeon: Erick SAUNDERS Bergamo, MD;  Location: [facility] CATH LAB;  Service: Cardiovascular;  Laterality: N/A;   LOWER EXTREMITY ANGIOGRAPHY N/A 09/05/2018   Procedure: LOWER EXTREMITY ANGIOGRAPHY;  Surgeon: Serene Gaile ORN, MD;  Location: [facility];  Service: Cardiovascular;  Laterality: N/A;   PERCUTANEOUS CORONARY STENT INTERVENTION (PCI-S)  11/14/2012   Procedure: PERCUTANEOUS CORONARY STENT INTERVENTION (PCI-S);  Surgeon: Erick JONELLE Bergamo, MD;  Location: [facility] CATH LAB;  Service: Cardiovascular;;   PERIPHERAL VASCULAR INTERVENTION Right 06/17/2020   Procedure: PERIPHERAL VASCULAR INTERVENTION;  Surgeon: Magda Debby SAILOR, MD;  Location: [facility];  Service:  Cardiovascular;  Laterality: Right;  Femoral/Popliteal    SPINE SURGERY     TONSILLECTOMY      Social History   Socioeconomic History   Marital status: Single    Spouse name: Not on file   Number of children: Not on file   Years of education: Not on file   Highest education level: Associate degree: occupational, Scientist, product/process development, or vocational program  Occupational History   Not on file  Tobacco Use   Smoking status: Former    Current packs/day: 0.00    Types: Cigarettes    Quit date: 05/22/2017    Years since quitting: 6.4    Passive exposure: Never   Smokeless tobacco: Never  Vaping Use   Vaping status: Never Used  Substance and Sexual Activity   Alcohol use: Yes    Comment: occ   Drug use: No   Sexual activity: Not Currently    Birth control/protection: None  Other Topics Concern   Not on file  Social History Narrative   Not on file   Social Drivers of Health   Financial Resource Strain: Medium Risk (01/18/2023)   Overall Financial Resource Strain (CARDIA)    Difficulty of Paying Living Expenses: Somewhat hard  Food Insecurity: Food Insecurity Present (01/18/2023)   Hunger Vital Sign    Worried About Running Out of Food in the Last Year: Sometimes true    Ran Out of Food in the Last Year: Sometimes true  Transportation Needs: No Transportation Needs (01/18/2023)   PRAPARE - Administrator, Civil Service (Medical): No    Lack of Transportation (Non-Medical): No  Physical Activity: Sufficiently Active (01/18/2023)   Exercise Vital Sign    Days of Exercise per Week: 5 days    Minutes of Exercise per Session: 60 min  Stress: Stress Concern Present (01/18/2023)   [employer] of Occupational Health - Occupational Stress Questionnaire    Feeling of Stress : Rather much  Social Connections: Moderately Integrated (01/18/2023)   Social Connection and Isolation Panel    Frequency of Communication with Friends and Family: Once a week    Frequency of Social  Gatherings with Friends and Family: Three times a week    Attends Religious Services: 1 to 4 times per year    Active Member of Clubs or Organizations: Yes    Attends Engineer, structural: More than 4 times per year    Marital Status: Never married  Catering manager Violence: Not on file    Family History  Problem Relation Age of Onset   Diabetes Brother    Heart disease Brother    Arthritis Brother    Stroke Mother    Diabetes Father    Heart disease Father     Current Outpatient Medications  Medication Sig Dispense Refill   amLODipine  (NORVASC ) 10 MG tablet Take 1 tablet (10 mg total) by mouth daily. 90 tablet 3   aspirin  EC 81 MG tablet Take 81 mg by mouth daily.     cholecalciferol (VITAMIN D3) 25 MCG (1000 UNIT) tablet Take 1,000 Units by mouth daily.  Continuous Glucose Receiver (FREESTYLE LIBRE 3 READER) DEVI USE AS DIRECTED TO MONITOR BLOOD SUGAR     Continuous Glucose Sensor (FREESTYLE LIBRE 3 PLUS SENSOR) MISC APPLY TO THE SKIN EVERY 15 DAYS AS DIRECTED TO MONITOR BLOOD GLUCOSE CONTINUOUSLY     dapagliflozin  propanediol (FARXIGA ) 5 MG TABS tablet Take 1 tablet (5 mg total) by mouth daily before breakfast. 30 tablet 2   doxycycline  (VIBRA -TABS) 100 MG tablet Take 1 tablet (100 mg total) by mouth 2 (two) times daily. 20 tablet 0   glyBURIDE  (DIABETA ) 5 MG tablet Take 5 mg by mouth 2 (two) times daily with a meal.     hydrochlorothiazide  (HYDRODIURIL ) 25 MG tablet Take 1 tablet (25 mg total) by mouth daily. 90 tablet 3   lisinopril  (ZESTRIL ) 40 MG tablet Take 1 tablet (40 mg total) by mouth daily. 90 tablet 0   MAGNESIUM  PO Take 1 tablet by mouth daily.     metoprolol  tartrate (LOPRESSOR ) 25 MG tablet Take 1 tablet (25 mg total) by mouth 2 (two) times daily. 180 tablet 0   mupirocin  ointment (BACTROBAN ) 2 % Apply 1 Application topically 2 (two) times daily. 30 g 2   rosuvastatin  (CRESTOR ) 40 MG tablet Take 1 tablet (40 mg total) by mouth daily. 90 tablet 3   No  current facility-administered medications for this visit.    Allergies  Allergen Reactions   Penicillins Rash and Other (See Comments)    Did it involve swelling of the face/tongue/throat, SOB, or low BP? No Did it involve sudden or SEVERE rash/hives, skin peeling, or any reaction on the inside of your mouth or nose? #  #  #  YES  #  #  #  Did you need to seek medical attention at a hospital or doctor's office? #  #  #  YES  #  #  #  When did it last happen? Unknown   PHYSICAL EXAMINATION:  Vitals:   10/31/23 0821  BP: 121/66  Pulse: (!) 52  Resp: 18  Temp: 98 F (36.7 C)  TempSrc: Temporal  SpO2: 97%  Weight: 137 lb 14.4 oz (62.6 kg)  Height: 5' 4 (1.626 m)    Elderly woman in no distress Regular rate and rhythm Unlabored breathing  No palpable pedal pulses Right foot with dependent rubor Onychomycosis of the right great toe without discrete ulcer   Non-Invasive Vascular Imaging:    +-------+-----------+-----------+------------+------------+  ABI/TBIToday's ABIToday's TBIPrevious ABIPrevious TBI  +-------+-----------+-----------+------------+------------+  Right 0.36       0.04       1.01        0.49          +-------+-----------+-----------+------------+------------+  Left  0.88       0.31       1.01        0.53          +-------+-----------+-----------+------------+------------+   Bilateral lower extremity arterial duplex  Right: 30-49% stenosis noted in the common femoral artery. Hypoechoic  plaque noted throughout the right stents. 50-99% restenosis of the SFA  stent (high-end of range). Patent popliteal artery stent. 50-99%  restenosis of the tibioperoneal trunk stent.   Left: Widely patent left femoral-popliteal artery bypass graft without  evidence of stenosis.   ASSESSMENT/PLAN::  Rebecca [name] is a 68 y.o. female with atherosclerosis of native and stented arteries of right lower extremity causing intermittent  claudication.  Patient counseled patients with asymptomatic peripheral arterial disease or claudication have a 1-2% risk of developing  chronic limb threatening ischemia, but a 15-30% risk of mortality in the next 5 years. Intervention should only be considered for medically optimized patients with disabling symptoms.   The patient is medically optimized.  She is not smoking.  She is compliant with best medical therapy.  Recommend:  Abstinence from all tobacco products. Blood glucose control with goal A1c < 7%. Blood pressure control with goal blood pressure < 130/80 mmHg. Lipid reduction therapy with goal LDL-C < 55 mg/dL. Aspirin  81mg  by mouth daily. Atorvastatin  40-80mg  PO QD (or other high intensity statin therapy).  Plan aortogram, right lower extremity angiogram with possible intervention via left common femoral approach in cath lab.    Debby SAILOR. Magda, MD [facility] Phone Number: [phone] 10/31/2023 8:38 AM

## 2023-11-02 ENCOUNTER — Ambulatory Visit (INDEPENDENT_AMBULATORY_CARE_PROVIDER_SITE_OTHER): Admitting: Podiatry

## 2023-11-02 DIAGNOSIS — I739 Peripheral vascular disease, unspecified: Secondary | ICD-10-CM

## 2023-11-02 DIAGNOSIS — B351 Tinea unguium: Secondary | ICD-10-CM | POA: Diagnosis not present

## 2023-11-02 DIAGNOSIS — M79675 Pain in left toe(s): Secondary | ICD-10-CM | POA: Diagnosis not present

## 2023-11-02 DIAGNOSIS — M79674 Pain in right toe(s): Secondary | ICD-10-CM | POA: Diagnosis not present

## 2023-11-06 NOTE — Progress Notes (Signed)
 Subjective: Chief Complaint  Patient presents with   Ingrown Toenail    RM# Follow up on ingrown nail right foot great toe.    68 year old female presents the office the above concerns.  She is not seeing any drainage or pus from the nail. She is scheduled for angio with vascular surgery.   Objective: AAO x3, NAD DP/PT pulses palpable bilaterally, CRT less than 3 seconds Nails remain unchanged as far as the color and thickening.  The area of concern along the right medial hallux nail border there is no drainage noted. There is no erythema, ascending cellulitis.  No fluctuation or crepitation. No pain with calf compression, swelling, warmth, erythema  Assessment: Ingrown toenail, PAD  Plan: -All treatment options discussed with the patient including all alternatives, risks, complications.  -I did further debride the nails and complications of bleeding x 10. Monitor for any signs of infection.  -Continue to follow up with vascular surgery.  -Patient encouraged to call the office with any questions, concerns, change in symptoms.   Return in about 2 months (around 01/03/2024) for nail trim/ingrown toenail/PAD.  Donnice JONELLE Fees DPM

## 2023-11-17 ENCOUNTER — Other Ambulatory Visit: Payer: Self-pay

## 2023-11-17 ENCOUNTER — Ambulatory Visit (HOSPITAL_COMMUNITY)
Admission: RE | Admit: 2023-11-17 | Discharge: 2023-11-17 | Disposition: A | Discharge status: Home / Self Care | Attending: Vascular Surgery | Admitting: Vascular Surgery

## 2023-11-17 ENCOUNTER — Encounter (HOSPITAL_COMMUNITY)
Admission: RE | Disposition: A | Discharge status: Home / Self Care | Payer: Self-pay | Source: Home / Self Care | Attending: Vascular Surgery

## 2023-11-17 DIAGNOSIS — Z7982 Long term (current) use of aspirin: Secondary | ICD-10-CM | POA: Insufficient documentation

## 2023-11-17 DIAGNOSIS — Z79899 Other long term (current) drug therapy: Secondary | ICD-10-CM | POA: Diagnosis not present

## 2023-11-17 DIAGNOSIS — Z95820 Peripheral vascular angioplasty status with implants and grafts: Secondary | ICD-10-CM | POA: Insufficient documentation

## 2023-11-17 DIAGNOSIS — I70211 Atherosclerosis of native arteries of extremities with intermittent claudication, right leg: Secondary | ICD-10-CM

## 2023-11-17 DIAGNOSIS — Z7984 Long term (current) use of oral hypoglycemic drugs: Secondary | ICD-10-CM | POA: Insufficient documentation

## 2023-11-17 DIAGNOSIS — T82858A Stenosis of vascular prosthetic devices, implants and grafts, initial encounter: Secondary | ICD-10-CM

## 2023-11-17 DIAGNOSIS — E1151 Type 2 diabetes mellitus with diabetic peripheral angiopathy without gangrene: Secondary | ICD-10-CM | POA: Diagnosis present

## 2023-11-17 DIAGNOSIS — I70213 Atherosclerosis of native arteries of extremities with intermittent claudication, bilateral legs: Secondary | ICD-10-CM | POA: Insufficient documentation

## 2023-11-17 DIAGNOSIS — Z87891 Personal history of nicotine dependence: Secondary | ICD-10-CM | POA: Insufficient documentation

## 2023-11-17 DIAGNOSIS — Z7902 Long term (current) use of antithrombotics/antiplatelets: Secondary | ICD-10-CM | POA: Diagnosis not present

## 2023-11-17 DIAGNOSIS — E782 Mixed hyperlipidemia: Secondary | ICD-10-CM | POA: Diagnosis not present

## 2023-11-17 DIAGNOSIS — I10 Essential (primary) hypertension: Secondary | ICD-10-CM | POA: Diagnosis not present

## 2023-11-17 DIAGNOSIS — I739 Peripheral vascular disease, unspecified: Secondary | ICD-10-CM

## 2023-11-17 HISTORY — PX: LOWER EXTREMITY INTERVENTION: CATH118252

## 2023-11-17 HISTORY — PX: ABDOMINAL AORTOGRAM: CATH118222

## 2023-11-17 HISTORY — PX: LOWER EXTREMITY ANGIOGRAPHY: CATH118251

## 2023-11-17 LAB — POCT I-STAT, CHEM 8
BUN: 42 mg/dL — ABNORMAL HIGH (ref 8–23)
Calcium, Ion: 1.19 mmol/L (ref 1.15–1.40)
Chloride: 100 mmol/L (ref 98–111)
Creatinine, Ser: 1.7 mg/dL — ABNORMAL HIGH (ref 0.44–1.00)
Glucose, Bld: 179 mg/dL — ABNORMAL HIGH (ref 70–99)
HCT: 34 % — ABNORMAL LOW (ref 36.0–46.0)
Hemoglobin: 11.6 g/dL — ABNORMAL LOW (ref 12.0–15.0)
Potassium: 3.9 mmol/L (ref 3.5–5.1)
Sodium: 139 mmol/L (ref 135–145)
TCO2: 28 mmol/L (ref 22–32)

## 2023-11-17 LAB — GLUCOSE, CAPILLARY: Glucose-Capillary: 156 mg/dL — ABNORMAL HIGH (ref 70–99)

## 2023-11-17 SURGERY — ABDOMINAL AORTOGRAM
Anesthesia: LOCAL | Laterality: Right

## 2023-11-17 MED ORDER — CLOPIDOGREL BISULFATE 300 MG PO TABS
ORAL_TABLET | ORAL | Status: AC
  Filled 2023-11-17: qty 1

## 2023-11-17 MED ORDER — MIDAZOLAM HCL 2 MG/2ML IJ SOLN
INTRAMUSCULAR | Status: DC | PRN
  Administered 2023-11-17: 1 mg via INTRAVENOUS

## 2023-11-17 MED ORDER — LABETALOL HCL 5 MG/ML IV SOLN: 10.0000 mg | INTRAVENOUS | Status: DC | PRN

## 2023-11-17 MED ORDER — HEPARIN SODIUM (PORCINE) 1000 UNIT/ML IJ SOLN
INTRAMUSCULAR | Status: AC
  Filled 2023-11-17: qty 10

## 2023-11-17 MED ORDER — ACETAMINOPHEN 325 MG PO TABS: 650.0000 mg | ORAL_TABLET | ORAL | Status: DC | PRN

## 2023-11-17 MED ORDER — LIDOCAINE HCL (PF) 1 % IJ SOLN
INTRAMUSCULAR | Status: AC
  Filled 2023-11-17: qty 30

## 2023-11-17 MED ORDER — FENTANYL CITRATE (PF) 100 MCG/2ML IJ SOLN
INTRAMUSCULAR | Status: AC
  Filled 2023-11-17: qty 2

## 2023-11-17 MED ORDER — IODIXANOL 320 MG/ML IV SOLN
INTRAVENOUS | Status: DC | PRN
  Administered 2023-11-17: 35 mL

## 2023-11-17 MED ORDER — MIDAZOLAM HCL 2 MG/2ML IJ SOLN
INTRAMUSCULAR | Status: AC
  Filled 2023-11-17: qty 2

## 2023-11-17 MED ORDER — HEPARIN SODIUM (PORCINE) 1000 UNIT/ML IJ SOLN
INTRAMUSCULAR | Status: DC | PRN
Start: 2023-11-17 — End: 2023-11-17
  Administered 2023-11-17: 6000 [IU] via INTRAVENOUS

## 2023-11-17 MED ORDER — ASPIRIN 81 MG PO CHEW
CHEWABLE_TABLET | ORAL | Status: DC | PRN
  Administered 2023-11-17: 81 mg via ORAL

## 2023-11-17 MED ORDER — HYDRALAZINE HCL 20 MG/ML IJ SOLN: 5.0000 mg | INTRAMUSCULAR | Status: DC | PRN

## 2023-11-17 MED ORDER — CLOPIDOGREL BISULFATE 300 MG PO TABS
ORAL_TABLET | ORAL | Status: DC | PRN
  Administered 2023-11-17: 300 mg via ORAL

## 2023-11-17 MED ORDER — FENTANYL CITRATE (PF) 100 MCG/2ML IJ SOLN
INTRAMUSCULAR | Status: DC | PRN
  Administered 2023-11-17: 50 ug via INTRAVENOUS

## 2023-11-17 MED ORDER — HEPARIN (PORCINE) IN NACL 1000-0.9 UT/500ML-% IV SOLN
INTRAVENOUS | Status: DC | PRN
  Administered 2023-11-17 (×2): 500 mL

## 2023-11-17 MED ORDER — LABETALOL HCL 5 MG/ML IV SOLN
INTRAVENOUS | Status: AC
Start: 2023-11-17 — End: 2023-11-17
  Filled 2023-11-17: qty 8

## 2023-11-17 MED ORDER — CLOPIDOGREL BISULFATE 75 MG PO TABS: 75.0000 mg | ORAL_TABLET | Freq: Every day | ORAL | 11 refills | Status: AC

## 2023-11-17 MED ORDER — LIDOCAINE HCL (PF) 1 % IJ SOLN
INTRAMUSCULAR | Status: DC | PRN
  Administered 2023-11-17: 15 mL

## 2023-11-17 MED ORDER — SODIUM CHLORIDE 0.9 % IV SOLN: INTRAVENOUS | Status: DC

## 2023-11-17 MED ORDER — ONDANSETRON HCL 4 MG/2ML IJ SOLN: 4.0000 mg | Freq: Four times a day (QID) | INTRAMUSCULAR | Status: DC | PRN

## 2023-11-17 SURGICAL SUPPLY — 18 items
BALLOON ADMIRAL INPACT 5X200 (BALLOONS) IMPLANT
CATH OMNI FLUSH 5F 65CM (CATHETERS) IMPLANT
CATH QUICKCROSS SUPP .035X90CM (MICROCATHETER) IMPLANT
CLOSURE PERCLOSE PROSTYLE (VASCULAR PRODUCTS) IMPLANT
COVER DOME SNAP 22 D (MISCELLANEOUS) IMPLANT
DCB RANGER 4.0X80 135 (BALLOONS) IMPLANT
GLIDEWIRE ADV .035X260CM (WIRE) IMPLANT
KIT ANGIASSIST CO2 SYSTEM (KITS) IMPLANT
KIT ENCORE 26 ADVANTAGE (KITS) IMPLANT
KIT MICROPUNCTURE NIT STIFF (SHEATH) IMPLANT
SET ATX-X65L (MISCELLANEOUS) IMPLANT
SHEATH CATAPULT 6FR 45 (SHEATH) IMPLANT
SHEATH PINNACLE 5F 10CM (SHEATH) IMPLANT
SHEATH PROBE COVER 6X72 (BAG) IMPLANT
TRAY PV CATH (CUSTOM PROCEDURE TRAY) ×2 IMPLANT
WIRE BENTSON .035X145CM (WIRE) IMPLANT
WIRE G V18X300CM (WIRE) IMPLANT
WIRE TORQFLEX AUST .018X40CM (WIRE) IMPLANT

## 2023-11-17 NOTE — Interval H&P Note (Signed)
 History and Physical Interval Note:  11/17/2023 10:33 AM  Rebecca [name]  has presented today for surgery, with the diagnosis of pad with claudication.  The various methods of treatment have been discussed with the patient and family. After consideration of risks, benefits and other options for treatment, the patient has consented to  Procedure(s): ABDOMINAL AORTOGRAM (N/A) Lower Extremity Angiography (N/A) LOWER EXTREMITY INTERVENTION (N/A) as a surgical intervention.  The patient's history has been reviewed, patient examined, no change in status, stable for surgery.  I have reviewed the patient's chart and labs.  Questions were answered to the patient's satisfaction.     Debby LOISE Robertson

## 2023-11-17 NOTE — Op Note (Signed)
 DATE OF SERVICE: 11/17/2023  PATIENT:  Rebecca [name]  68 y.o. 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 Magda, MD  ASSISTANT: none  ANESTHESIA:   local and IV sedation  ESTIMATED BLOOD LOSS: minimal  LOCAL MEDICATIONS USED:  LIDOCAINE    COUNTS: confirmed correct.  PATIENT DISPOSITION:  PACU - hemodynamically stable.   Delay start of Pharmacological VTE agent (>24hrs) due to surgical blood loss or risk of bleeding: no  INDICATION FOR PROCEDURE: Rebecca [name] is a 68 y.o. female with atherosclerosis of right lower extremity causing claudication. After careful discussion of risks, benefits, and alternatives the patient was offered angiogram. The patient understood and wished to proceed.  OPERATIVE FINDINGS:   Left renal artery: patent Right renal artery: patent  Infrarenal aorta: patent  Left common iliac artery: patent Right common iliac artery: patent  Left internal iliac artery: patent Right internal iliac artery: patent  Left external iliac artery: patent Right external iliac artery: patent  Left common femoral artery: not studied Right common femoral artery: patent  Left profunda femoris artery: not studied Right profunda femoris artery: patent  Left superficial femoral artery: not studied Right superficial femoral artery: severe in-stent restenosis throughout entire artery; worst stenosis ~95%  Left popliteal artery: not studied Right popliteal  artery: severe in-stent restenosis throughout entire artery; worst stenosis ~95%. Behind knee popliteal artery with 60% stenosis  Left anterior tibial artery: not studied Right anterior tibial artery: occluded  Left tibioperoneal trunk: not studied Right tibioperoneal trunk: patent  Left peroneal artery: not studied Right peroneal artery: patent to ankle  Left posterior tibial artery: not studied Right posterior tibial artery: occluded  Left pedal circulation: not studied Right pedal circulation: fills via peroneal   DESCRIPTION OF PROCEDURE: After identification of the patient in the pre-operative holding area, the patient was transferred to the operating room. The patient was positioned supine on the operating room table. Anesthesia was induced. The groins was prepped and draped in standard fashion. A surgical pause was performed confirming correct patient, procedure, and operative location.  The left groin was anesthetized with subcutaneous injection of 1% lidocaine . Using ultrasound guidance, the left common femoral artery was accessed with micropuncture technique. Fluoroscopy was used to confirm cannulation over the femoral head. The 3258F micropuncture sheath was upsized to 258F.   A Benson wire was advanced into the distal aorta. Over the wire an omni flush catheter was advanced to the level of L2. Aortogram was performed - see above for details.   The right common iliac artery was selected with an omniflush catheter and glidewire advantage guidewire. The wire was advanced into the common femoral artery. Over the wire the omni flush catheter was advanced into the external iliac artery. Selective angiography was performed - see above for details.   The decision was made to intervene. The patient was heparinized with 7000 units of heparin . The 258F sheath was exchanged for a 86F x 45cm sheath. Selective angiography of the extremity performed prior to intervention. Selective angiogram was performed after  crossing the lesion.  The lesions were treated with: Right femoropopliteal drug coated angioplasty  Completion angiography revealed:  Resolution of in-stent stenosis and behind knee stenosis  A perclose was used to close the arteriotomy. Hemostasis was excellent upon completion.   Conscious sedation was administered with the use of IV fentanyl  and midazolam  under continuous physician and nurse monitoring.  Heart rate, blood pressure, and oxygen saturation were continuously monitored.  Total sedation time was 38 minutes  Upon completion of the case instrument and sharps counts were confirmed correct. The patient was transferred to the PACU in good condition. I was present for all portions of the procedure.  PLAN: ASA / Plavix  / Statin. Follow up with me in 4 weeks with ABI and RLE duplex.   Debby SAILOR. Magda, MD [facility] Phone Number: [phone] 11/17/2023 11:23 AM

## 2023-11-18 ENCOUNTER — Encounter (HOSPITAL_COMMUNITY): Payer: Self-pay | Admitting: Vascular Surgery

## 2023-11-24 DIAGNOSIS — Z008 Encounter for other general examination: Secondary | ICD-10-CM | POA: Diagnosis not present

## 2023-11-24 DIAGNOSIS — I2510 Atherosclerotic heart disease of native coronary artery without angina pectoris: Secondary | ICD-10-CM | POA: Diagnosis not present

## 2023-11-24 DIAGNOSIS — N1832 Chronic kidney disease, stage 3b: Secondary | ICD-10-CM | POA: Diagnosis not present

## 2023-12-19 ENCOUNTER — Encounter: Admitting: Family Medicine

## 2023-12-25 ENCOUNTER — Other Ambulatory Visit (HOSPITAL_BASED_OUTPATIENT_CLINIC_OR_DEPARTMENT_OTHER): Payer: Self-pay | Admitting: Family Medicine

## 2023-12-25 DIAGNOSIS — I10 Essential (primary) hypertension: Secondary | ICD-10-CM

## 2023-12-26 ENCOUNTER — Ambulatory Visit (INDEPENDENT_AMBULATORY_CARE_PROVIDER_SITE_OTHER): Admitting: Family Medicine

## 2023-12-26 ENCOUNTER — Ambulatory Visit
Admission: RE | Admit: 2023-12-26 | Discharge: 2023-12-26 | Disposition: A | Discharge status: Home / Self Care | Source: Ambulatory Visit | Attending: Family Medicine | Admitting: Family Medicine

## 2023-12-26 ENCOUNTER — Ambulatory Visit
Admission: RE | Admit: 2023-12-26 | Discharge: 2023-12-26 | Disposition: A | Discharge status: Home / Self Care | Attending: Family Medicine | Admitting: Family Medicine

## 2023-12-26 ENCOUNTER — Encounter: Payer: Self-pay | Admitting: Family Medicine

## 2023-12-26 VITALS — BP 95/79 | HR 77 | Resp 16 | Ht 64.0 in | Wt 136.0 lb

## 2023-12-26 DIAGNOSIS — M7752 Other enthesopathy of left foot: Secondary | ICD-10-CM | POA: Diagnosis not present

## 2023-12-26 DIAGNOSIS — R7989 Other specified abnormal findings of blood chemistry: Secondary | ICD-10-CM | POA: Diagnosis not present

## 2023-12-26 DIAGNOSIS — M79672 Pain in left foot: Secondary | ICD-10-CM

## 2023-12-26 DIAGNOSIS — M19072 Primary osteoarthritis, left ankle and foot: Secondary | ICD-10-CM | POA: Diagnosis not present

## 2023-12-26 DIAGNOSIS — M85872 Other specified disorders of bone density and structure, left ankle and foot: Secondary | ICD-10-CM | POA: Diagnosis not present

## 2023-12-26 NOTE — Progress Notes (Signed)
 Acute Care Office Visit  Subjective:   Rebecca [name] [birth year] 12/26/2023  Chief Complaint  Patient presents with   Foot Injury    Left foot injury 8 days ago after a fall.   Discussed the use of AI scribe software for clinical note transcription with the patient, who gave verbal consent to proceed.  History of Present Illness   Rebecca [name] is a 68 year old female who presents with left foot pain following a fall x8 days ago.   She experienced left foot pain after slipping on water in her dining room. During the fall, her right leg slid, and her left leg folded under at the knee, causing her foot to plant flat and turn sideways. Bruising is present on both sides of her ankle and toes, which are also painful.  Initially, there was no pain, but after approximately 30 minutes, she experienced severe pain described as 'a screaming child.' She was unable to put weight on the foot the night of the incident. By the next day, she could bear weight, but the pain persisted, particularly in the toes and metatarsals, accompanied by swelling. Ice packs and cold wraps help reduce the swelling, but the pain remains.  She has been hesitant to take NSAIDs due to previous advice against them because of kidney function concerns. However, due to severe pain, she took NSAIDs on two occasions since the incident. She moved her appointment from the previous week because she could not put weight on her foot at that time.  She reports that it does hurt when she puts weight on it, with the pain improving but still present.   The following portions of the patient's history were reviewed and updated as appropriate: past medical history, past surgical history, family history, social history, allergies, medications, and problem list.   Patient Active Problem List   Diagnosis Date Noted   Stage 3b chronic kidney disease (HCC) 09/22/2023   Hypertriglyceridemia 09/22/2023   Combined forms of age-related  cataract of both eyes 04/07/2023   Mature cataract 04/07/2023   Essential hypertension 01/18/2023   Closed nondisplaced fracture of middle phalanx of finger of right hand 01/18/2023   Hyperlipidemia associated with type 2 diabetes mellitus (HCC) 01/18/2023   PAD (peripheral artery disease) (HCC) 09/05/2018   NSTEMI (non-ST elevated myocardial infarction) (HCC) 11/14/2012   DM (diabetes mellitus), type 2 with complications (HCC) 04/06/2012   Tobacco abuse 04/06/2012   Past Medical History:  Diagnosis Date   Allergy 1963   Anxiety    CAD (coronary artery disease)    Depression    Diabetes mellitus without complication (HCC)    DM (diabetes mellitus), type 2, uncontrolled with complications 04/06/2012   Hypertension    Hypertensive emergency 11/14/2012   Hypokalemia 11/14/2012   Hyponatremia 04/08/2012   Mature cataract 04/07/2023   Mixed hyperlipidemia 04/06/2012   NSTEMI (non-ST elevated myocardial infarction) (HCC) 11/14/2012   Tobacco abuse 04/06/2012   Past Surgical History:  Procedure Laterality Date   ABDOMINAL AORTOGRAM N/A 11/17/2023   Procedure: ABDOMINAL AORTOGRAM;  Surgeon: Magda Debby SAILOR, MD;  Location: [facility] INVASIVE CV LAB;  Service: Cardiovascular;  Laterality: N/A;   ABDOMINAL AORTOGRAM W/LOWER EXTREMITY N/A 06/17/2020   Procedure: ABDOMINAL AORTOGRAM W/LOWER EXTREMITY;  Surgeon: Magda Debby SAILOR, MD;  Location: [facility]AS[facility];  Service: Cardiovascular;  Laterality: N/A;   FEMORAL-POPLITEAL BYPASS GRAFT Left 09/20/2018   Procedure: Left  FEMORAL-BELOW KNEE POPLITEAL ARTERY Bypass with Translocated nonreversed saphenous vein;  Surgeon: Oris Krystal FALCON, MD;  Location: [facility] OR;  Service: Vascular;  Laterality: Left;   FRACTURE SURGERY  1967   LEFT HEART CATHETERIZATION WITH CORONARY ANGIOGRAM N/A 11/14/2012   Procedure: LEFT HEART CATHETERIZATION WITH CORONARY ANGIOGRAM;  Surgeon: Erick JONELLE Bergamo, MD;  Location: [facility] CATH LAB;  Service: Cardiovascular;  Laterality: N/A;    LOWER EXTREMITY ANGIOGRAPHY N/A 09/05/2018   Procedure: LOWER EXTREMITY ANGIOGRAPHY;  Surgeon: Serene Gaile ORN, MD;  Location: [facility];  Service: Cardiovascular;  Laterality: N/A;   LOWER EXTREMITY ANGIOGRAPHY Right 11/17/2023   Procedure: Lower Extremity Angiography;  Surgeon: Magda Debby SAILOR, MD;  Location: [facility] INVASIVE CV LAB;  Service: Cardiovascular;  Laterality: Right;   LOWER EXTREMITY INTERVENTION Right 11/17/2023   Procedure: LOWER EXTREMITY INTERVENTION;  Surgeon: Magda Debby SAILOR, MD;  Location: [facility];  Service: Cardiovascular;  Laterality: Right;   PERCUTANEOUS CORONARY STENT INTERVENTION (PCI-S)  11/14/2012   Procedure: PERCUTANEOUS CORONARY STENT INTERVENTION (PCI-S);  Surgeon: Erick JONELLE Bergamo, MD;  Location: [facility] CATH LAB;  Service: Cardiovascular;;   PERIPHERAL VASCULAR INTERVENTION Right 06/17/2020   Procedure: PERIPHERAL VASCULAR INTERVENTION;  Surgeon: Magda Debby SAILOR, MD;  Location: [facility] CV [facility];  Service: Cardiovascular;  Laterality: Right;  Femoral/Popliteal    SPINE SURGERY     TONSILLECTOMY     Family History  Problem Relation Age of Onset   Diabetes Brother    Heart disease Brother    Arthritis Brother    Stroke Mother    Diabetes Father    Heart disease Father    Outpatient Medications Prior to Visit  Medication Sig Dispense Refill   amLODipine  (NORVASC ) 10 MG tablet Take 1 tablet (10 mg total) by mouth daily. 90 tablet 3   aspirin  EC 81 MG tablet Take 81 mg by mouth in the morning.     clopidogrel  (PLAVIX ) 75 MG tablet Take 1 tablet (75 mg total) by mouth daily. 30 tablet 11   Continuous Glucose Receiver (FREESTYLE LIBRE 3 READER) DEVI USE AS DIRECTED TO MONITOR BLOOD SUGAR     Continuous Glucose Sensor (FREESTYLE LIBRE 3 PLUS SENSOR) MISC APPLY TO THE SKIN EVERY 15 DAYS AS DIRECTED TO MONITOR BLOOD GLUCOSE CONTINUOUSLY     dapagliflozin  propanediol (FARXIGA ) 5 MG TABS tablet Take 1 tablet (5 mg total) by mouth daily before  breakfast. 30 tablet 2   hydrochlorothiazide  (HYDRODIURIL ) 25 MG tablet Take 1 tablet (25 mg total) by mouth daily. 90 tablet 3   KRILL OIL PO Take 1 capsule by mouth daily with supper.     lisinopril  (ZESTRIL ) 40 MG tablet Take 1 tablet (40 mg total) by mouth daily. 90 tablet 0   metoprolol  tartrate (LOPRESSOR ) 25 MG tablet Take 1 tablet (25 mg total) by mouth 2 (two) times daily. 180 tablet 0   rosuvastatin  (CRESTOR ) 40 MG tablet Take 1 tablet (40 mg total) by mouth daily. 90 tablet 3   mupirocin  ointment (BACTROBAN ) 2 % Apply 1 Application topically 2 (two) times daily. (Patient not taking: Reported on 12/26/2023) 30 g 2   No facility-administered medications prior to visit.   Allergies  Allergen Reactions   Penicillins Rash and Other (See Comments)    Did it involve swelling of the face/tongue/throat, SOB, or low BP? No Did it involve sudden or SEVERE rash/hives, skin peeling, or any reaction on the inside of your mouth or nose? #  #  #  YES  #  #  #  Did you need to  seek medical attention at a hospital or doctor's office? #  #  #  YES  #  #  #  When did it last happen? Unknown   ROS: A complete ROS was performed with pertinent positives/negatives noted in the HPI. The remainder of the ROS are negative.    Objective:   Today's Vitals   12/26/23 0935  BP: 95/79  Pulse: 77  Resp: 16  SpO2: 99%  Weight: 136 lb (61.7 kg)  Height: 5' 4 (1.626 m)  PainSc: 4    Physical Exam Vitals reviewed.  Constitutional:      Appearance: Normal appearance.  Cardiovascular:     Rate and Rhythm: Normal rate and regular rhythm.     Pulses: Normal pulses.          Dorsalis pedis pulses are 2+ on the left side.       Posterior tibial pulses are 2+ on the left side.     Heart sounds: Normal heart sounds.  Pulmonary:     Effort: Pulmonary effort is normal.     Breath sounds: Normal breath sounds.  Musculoskeletal:     Right foot: Normal range of motion.     Left foot: Normal range of motion.   Feet:     Left foot:     Skin integrity: Skin integrity normal. No skin breakdown, erythema or warmth.     Comments: Ecchymosis present on the first 3 toes of the left foot  Neurological:     Mental Status: She is alert.  Psychiatric:        Mood and Affect: Mood normal.        Behavior: Behavior normal.      Assessment & Plan:   1. Left foot pain (Primary) Acute left foot pain and swelling post-twisting injury (see physical exam for description of assessment of left foot). Advise rest, ice, elevation, and avoid pressure. Advise against wrapping unless for comfort. Will order x-ray to determine extent of injury. Consider orthopedic referral if fracture present. - DG Foot Complete Left; Future  2. Elevated serum creatinine Chronic kidney disease with recent NSAID use. Concern for NSAID impact on renal function. - Order kidney function tests including eGFR. - Advise hydration, especially while on Farxiga .  - Basic Metabolic Panel (BMET)   Lab Orders         Basic Metabolic Panel (BMET)      Return in about 6 weeks (around 02/06/2024) for Initial AWV (nurse visit).    Patient to reach out to office if new, worrisome, or unresolved symptoms arise or if no improvement in patient's condition. Patient verbalized understanding and is agreeable to treatment plan. All questions answered to patient's satisfaction.    Evalene Arts, FNP

## 2023-12-26 NOTE — Patient Instructions (Signed)
 MyChart:  For all urgent or time sensitive needs we ask that you please call the office to avoid delays. Our number is [phone]) [phone]. MyChart is not constantly monitored and due to the large volume of messages a day, replies may take up to 72 business hours.   MyChart Policy: MyChart allows for you to see your visit notes, after visit summary, provider recommendations, lab and tests results, make an appointment, request refills, and contact your provider or the office for non-urgent questions or concerns. Providers are seeing patients during normal business hours and do not have built in time to review MyChart messages.  We ask that you allow a minimum of 3 business days for responses to [employer]. For this reason, please do not send urgent requests through MyChart. Please call the office at [phone]. New and ongoing conditions may require a visit. We have virtual and in person visit available for your convenience.  Complex MyChart concerns may require a visit. Your provider may request you schedule a virtual or in person visit to ensure we are providing the best care possible. MyChart messages sent after 11:00 AM on Friday will not be received by the provider until Monday morning.    Lab and Test Results: You will receive your lab and test results on MyChart as soon as they are completed and results have been sent by the lab or testing facility. Due to this service, you will receive your results BEFORE your provider.  I review lab and tests results each morning prior to seeing patients. Some results require collaboration with other providers to ensure you are receiving the most appropriate care. For this reason, we ask that you please allow a minimum of 3-5 business days from the time the ALL results have been received for your provider to receive and review lab and test results and contact you about these.  Most lab and test result comments from the provider will be sent through  MyChart. Your provider may recommend changes to the plan of care, follow-up visits, repeat testing, ask questions, or request an office visit to discuss these results. You may reply directly to this message or call the office at [phone] provide information for the provider or set up an appointment. In some instances, you will be called with test results and recommendations. Please let us  know if this is preferred and we will make note of this in your chart to provide this for you.    If you have not heard a response to your lab or test results in 5 business days from all results returning to MyChart, please call the office to let us  know. We ask that you please avoid calling prior to this time unless there is an emergent concern. Due to high call volumes, this can delay the resulting process.   After Hours: For all non-emergency after hours needs, please call the office at [phone] and select the option to reach the on-call provider service. On-call services are shared between multiple [place] offices and therefore it will not be possible to speak directly with your provider. On-call providers may provide medical advice and recommendations, but are unable to provide refills for maintenance medications.  For all emergency or urgent medical needs after normal business hours, we recommend that you seek care at the closest Urgent Care or Emergency Department to ensure appropriate treatment in a timely manner.  [facility] has a 24 hour emergency room located on the ground floor  for your convenience.    Urgent Concerns During the Business Day Providers are seeing patients from 8AM to 5PM, Monday through Thursday, and 8AM to 12PM on Friday with a busy schedule and are most often not able to respond to non-urgent calls until the end of the day or the next business day. If you should have URGENT concerns during the day, please call and speak to the nurse or schedule a same  day appointment so that we can address your concern without delay.    Thank you, again, for choosing me as your health care partner. I appreciate your trust and look forward to learning more about you.    Evalene Arts, FNP-C

## 2023-12-27 LAB — BASIC METABOLIC PANEL WITH GFR
BUN/Creatinine Ratio: 24 (ref 12–28)
BUN: 33 mg/dL — ABNORMAL HIGH (ref 8–27)
CO2: 23 mmol/L (ref 20–29)
Calcium: 8.8 mg/dL (ref 8.7–10.3)
Chloride: 98 mmol/L (ref 96–106)
Creatinine, Ser: 1.35 mg/dL — ABNORMAL HIGH (ref 0.57–1.00)
Glucose: 178 mg/dL — ABNORMAL HIGH (ref 70–99)
Potassium: 4.3 mmol/L (ref 3.5–5.2)
Sodium: 137 mmol/L (ref 134–144)
eGFR: 43 mL/min/1.73 — ABNORMAL LOW (ref >59)

## 2023-12-29 ENCOUNTER — Telehealth: Payer: Self-pay | Admitting: Family Medicine

## 2023-12-29 ENCOUNTER — Ambulatory Visit

## 2023-12-29 NOTE — Telephone Encounter (Signed)
 Copied from CRM [id number]. Topic: Clinical - Lab/Test Results >> Dec 29, 2023  1:25 PM Jayma L wrote: Reason for CRM: patient asking for a callback about foot xray done on 12/26/2023

## 2024-01-02 ENCOUNTER — Other Ambulatory Visit: Payer: Self-pay

## 2024-01-02 DIAGNOSIS — I10 Essential (primary) hypertension: Secondary | ICD-10-CM

## 2024-01-02 MED ORDER — LISINOPRIL 40 MG PO TABS: 40.0000 mg | ORAL_TABLET | Freq: Every day | ORAL | 3 refills | Status: AC

## 2024-01-04 ENCOUNTER — Encounter: Payer: Self-pay | Admitting: Podiatry

## 2024-01-04 ENCOUNTER — Ambulatory Visit (INDEPENDENT_AMBULATORY_CARE_PROVIDER_SITE_OTHER): Admitting: Podiatry

## 2024-01-04 VITALS — Ht 64.0 in | Wt 136.0 lb

## 2024-01-04 DIAGNOSIS — S9032XA Contusion of left foot, initial encounter: Secondary | ICD-10-CM | POA: Diagnosis not present

## 2024-01-04 DIAGNOSIS — B351 Tinea unguium: Secondary | ICD-10-CM | POA: Diagnosis not present

## 2024-01-04 DIAGNOSIS — I739 Peripheral vascular disease, unspecified: Secondary | ICD-10-CM | POA: Diagnosis not present

## 2024-01-04 DIAGNOSIS — M79675 Pain in left toe(s): Secondary | ICD-10-CM

## 2024-01-04 DIAGNOSIS — L600 Ingrowing nail: Secondary | ICD-10-CM

## 2024-01-04 DIAGNOSIS — M79674 Pain in right toe(s): Secondary | ICD-10-CM | POA: Diagnosis not present

## 2024-01-04 NOTE — Progress Notes (Signed)
 Subjective: Chief Complaint  Patient presents with   Nail Problem    Pt is here to have toenail trimmed, states she fell a few weeks ago and hurt the left foot, states she seen here PCP for this issue x-rays were done still some pain in the foot.    68 year old female presents the office today with above concerns.  Recently underwent intervention with vascular surgery she states her legs feel much better since having the procedure done with better blood flow.  She also reports that she had an injury a few weeks ago.  She recently seen by her PCP for this issue.  X-rays were taken at that time but she states that she has not heard the results.  She still has some pain and bruising along the forefoot.  DOI 8/17  Objective: AAO x3, NAD DP/PT pulses palpable bilaterally, CRT less than 3 seconds Nails can to be hypertrophic dystrophic with incurvation present to hallux nail borders mostly.  There is no edema, erythema.  She has intact nails 1-5 are thickened, elongated. To the left foot there is bruising present on the hallux, 2nd and 3rd toes and just proximal to the MPJs.  Localized edema.  No erythema.  Flexor, extensor tendons intact.  There is no pain to the ankle today. No pain with calf compression, swelling, warmth, erythema  Assessment: Ingrown toenail, PAD; contusion left foot  Plan: Left foot injury - Reviewed the x-rays.  Recommend immobilization.  Surgical shoe was dispensed today as no pain to the ankle and its along the forefoot. Elevation  Symptomatic onychomycosis - Sharply debrided nails x 10 without any complications or bleeding  Return for left foot injury in 4 weeks; foot exam in 9 weeks.  Rebecca [name] DPM

## 2024-01-05 ENCOUNTER — Ambulatory Visit: Payer: Self-pay | Admitting: Family Medicine

## 2024-01-05 NOTE — Telephone Encounter (Signed)
 [facility] states they do not have an ETA on when imaging will be read.

## 2024-01-08 ENCOUNTER — Telehealth: Payer: Self-pay | Admitting: Family Medicine

## 2024-01-08 NOTE — Telephone Encounter (Signed)
 Called patient to discuss imaging of left foot. Overall, this report suggests some trauma, from an injury, to the foot and ankle, with some early degenerative changes, especially in the toes. The presence of enthesophytes could indicate chronic stress or inflammation in the calcaneus area. She reports she is currently seeing a podiatrist, who has ordered a immobilization shoe for her. She has a follow-up scheduled for 4 weeks with him.   Discussed holding hydrochlorothiazide  due to recent kidney function. Serum creatinine 1.35 and eGFR 43 results in last BMP. Advised patient to continue to monitor blood pressure readings at home and plan to follow-up in 4 weeks for HTN and kidney function. Discussed may need to adjust medications and/or see nephrologist in the future, depending on lab results. Patient verbalized an understanding and all questions answered.   Rebecca Arts, FNP-C [facility]  [address], [place], [place] [address] [phone]

## 2024-01-23 ENCOUNTER — Other Ambulatory Visit: Payer: Self-pay

## 2024-01-23 DIAGNOSIS — I10 Essential (primary) hypertension: Secondary | ICD-10-CM

## 2024-01-23 NOTE — Telephone Encounter (Signed)
 Metoprolol  refill request denied. Patient needs an office visit.

## 2024-02-01 ENCOUNTER — Encounter: Payer: Self-pay | Admitting: Podiatry

## 2024-02-01 ENCOUNTER — Ambulatory Visit: Admitting: Podiatry

## 2024-02-01 ENCOUNTER — Ambulatory Visit (INDEPENDENT_AMBULATORY_CARE_PROVIDER_SITE_OTHER)

## 2024-02-01 VITALS — Ht 64.0 in | Wt 136.0 lb

## 2024-02-01 DIAGNOSIS — S9032XA Contusion of left foot, initial encounter: Secondary | ICD-10-CM | POA: Diagnosis not present

## 2024-02-01 DIAGNOSIS — S9032XD Contusion of left foot, subsequent encounter: Secondary | ICD-10-CM | POA: Diagnosis not present

## 2024-02-01 NOTE — Progress Notes (Signed)
 Subjective: Chief Complaint  Patient presents with   Foot Pain    Pt is here to f/u on left foot due to pain, she states that her foot is better still has some pain occasionally, no other complaints.    68 year old female presents the office today with above concerns.  She presents here for follow-up valuation of left foot pain, injury.  She states that she has no pain to the ankle and she gets pain mostly to her big toe.  She states at times will explode in pain to the big toe.  She still in surgical shoe.  No recent injuries or changes otherwise.  DOI 8/17  Objective: AAO x3, NAD DP/PT pulses palpable bilaterally, CRT less than 3 seconds There is no pain to the ankle or rear foot.  She does get some discomfort on exam to the hallux, second digit.  No pain on metatarsals or other areas of the foot.  Localized edema present to the hallux mostly.  There is no erythema or warmth.  No open lesions. No pain with calf compression, swelling, warmth, erythema  Assessment: Contusion, avulsion fractures left foot  Plan: Left foot injury -Repeat x-rays obtained and reviewed with the patient.  Healing avulsion fractures noted but no evidence of acute fracture otherwise. - Recommend continue mobilization and surgical shoe.  Discussed ice, elevation.  Tylenol  as needed for pain.  Return for as scheduled, x-ray.  Donnice JONELLE Fees DPM

## 2024-03-07 ENCOUNTER — Ambulatory Visit (INDEPENDENT_AMBULATORY_CARE_PROVIDER_SITE_OTHER)

## 2024-03-07 ENCOUNTER — Encounter: Payer: Self-pay | Admitting: Podiatry

## 2024-03-07 ENCOUNTER — Ambulatory Visit: Admitting: Podiatry

## 2024-03-07 VITALS — Ht 64.0 in | Wt 136.0 lb

## 2024-03-07 DIAGNOSIS — M10071 Idiopathic gout, right ankle and foot: Secondary | ICD-10-CM

## 2024-03-07 DIAGNOSIS — S9032XD Contusion of left foot, subsequent encounter: Secondary | ICD-10-CM

## 2024-03-07 MED ORDER — COLCHICINE 0.6 MG PO TABS: 0.6000 mg | ORAL_TABLET | Freq: Every day | ORAL | 0 refills | Status: DC

## 2024-03-07 NOTE — Patient Instructions (Signed)
 Colchicine Capsules What is this medication? COLCHICINE (KOL chi seen) prevents gout attacks. It works by decreasing inflammation and reducing the buildup of uric acid in your joints. This medicine may be used for other purposes; ask your health care provider or pharmacist if you have questions. COMMON BRAND NAME(S): MITIGARE What should I tell my care team before I take this medication? They need to know if you have any of these conditions: Kidney disease Liver disease An unusual or allergic reaction to colchicine, other medications, foods, dyes, or preservatives Pregnant or trying to get pregnant Breast-feeding How should I use this medication? Take this medication by mouth with water. Take it as directed on the prescription label at the same time every day. You can take it with or without food. If it upsets your stomach, take it with food. A special MedGuide will be given to you by the pharmacist with each prescription and refill. Be sure to read this information carefully each time. Talk to your care team about the use of this medication in children. Special care may be needed. People 65 years and older may have a stronger reaction and need a smaller dose. Overdosage: If you think you have taken too much of this medicine contact a poison control center or emergency room at once. NOTE: This medicine is only for you. Do not share this medicine with others. What if I miss a dose? If you miss a dose, take it as soon as you can. If it is almost time for your next dose, take only that dose. Do not take double or extra doses. What may interact with this medication? Do not take this medication with any of the following: Certain antivirals for HIV or hepatitis This medication may also interact with the following: Certain antibiotics, such as erythromycin  or clarithromycin Certain medications for blood pressure, heart disease, irregular heartbeat Certain medications for cholesterol, such as  atorvastatin, lovastatin, or simvastatin Certain medications for fungal infections, such as ketoconazole, itraconazole, or posaconazole Cyclosporine Grapefruit or grapefruit juice This list may not describe all possible interactions. Give your health care provider a list of all the medicines, herbs, non-prescription drugs, or dietary supplements you use. Also tell them if you smoke, drink alcohol, or use illegal drugs. Some items may interact with your medicine. What should I watch for while using this medication? Visit your care team for regular checks on your progress. Tell your care team if your symptoms do not start to get better or if they get worse. You should make sure you get enough vitamin B12 while you are taking this medication. Discuss the foods you eat and the vitamins you take with your care team. This medication may increase your risk to bruise or bleed. Call you care team if you notice any unusual bleeding. What side effects may I notice from receiving this medication? Side effects that you should report to your care team as soon as possible: Allergic reactions--skin rash, itching, hives, swelling of the face, lips, tongue, or throat Infection--fever, chills, cough, sore throat, wounds that don't heal, pain or trouble when passing urine, general feeling of discomfort or being unwell Muscle injury--unusual weakness or fatigue, muscle pain, dark yellow or brown urine, decrease in the amount of urine Pain, tingling, or numbness in the hands or feet Unusual bleeding or bruising Side effects that usually do not require medical attention (report these to your care team if they continue or are bothersome): Diarrhea Nausea Vomiting This list may not describe all possible  side effects. Call your doctor for medical advice about side effects. You may report side effects to FDA at 1-800-FDA-1088. Where should I keep my medication? Keep out of the reach of children and pets. Store at room  temperature between 20 and 25 degrees C (68 and 77 degrees F). Protect from light and moisture. Keep the container tightly closed. Get rid of any unused medication after the expiration date. To get rid of medications that are no longer needed or have expired: Take the medication to a medication take-back program. Check with your pharmacy or law enforcement to find a location. If you cannot return the medication, check the label or package insert to see if the medication should be thrown out in the garbage or flushed down the toilet. If you are not sure, ask your care team. If it is safe to put it in the trash, take the medication out of the container. Mix the medication with cat litter, dirt, coffee grounds, or other unwanted substance. Seal the mixture in a bag or container. Put it in the trash. NOTE: This sheet is a summary. It may not cover all possible information. If you have questions about this medicine, talk to your doctor, pharmacist, or health care provider.  2024 Elsevier/Gold Standard (2021-11-17 00:00:00)

## 2024-03-08 ENCOUNTER — Ambulatory Visit: Payer: Self-pay | Admitting: Podiatry

## 2024-03-08 LAB — URIC ACID: Uric Acid: 6.1 mg/dL (ref 3.0–7.2)

## 2024-03-10 NOTE — Progress Notes (Signed)
 Subjective: Chief Complaint  Patient presents with   Foot Injury    Pt is here to f/u on left foot due to contusion, she states that its better then before but still has some pain, complains of gout flare in the right foot that began Saturday.      68 year old female presents the office today with above concerns.  States that that is left foot, ankle is doing better.  She is still in a surgical shoe.  Does not report any recent injuries or falls to the left lower extremity.   On the right side she does note she is getting a gout flare.  She has had gout in the past but spent some time.  She points on the first MTPJ where she gets swelling, redness.  She states that it occurred suddenly overnight.  No injuries.  This started approximately 1 week ago.  No recent treatment.  DOI 8/17  Objective: AAO x3, NAD DP/PT pulses palpable bilaterally, CRT less than 3 seconds There is no pain to the ankle or rear foot there is no significant edema as well.  There is no area of pinpoint tenderness.  Flexor, extensor tendons appear to be intact. On the right foot there is tenderness localized edema and erythema on the first MTPJ.  There is no pain with MPJ range of motion.  There is no ulcerations.  No fluctuation or crepitation. No pain with calf compression, swelling, warmth, erythema  Assessment: Contusion, avulsion fractures left foot; concern for gout right foot  Plan: Left foot injury -Repeat x-rays obtained and reviewed with the patient.  Healing avulsion fractures noted but no evidence of acute fracture otherwise. - Discussed that she can start to transition to regular supportive sneaker as tolerated.  Ice, elevation.  Tylenol  as needed for pain.   Concern for right foot gout (secondary concern) - It does seem that her symptoms are consistent with gout.  Will order uric acid level. - If needed consider steroid injection.  Prescribed colchicine. - If symptoms persist recommend  x-rays.   Rebecca [name] DPM

## 2024-03-13 LAB — HM MAMMOGRAPHY

## 2024-03-15 ENCOUNTER — Ambulatory Visit: Payer: Self-pay | Admitting: Family Medicine

## 2024-03-15 ENCOUNTER — Encounter: Payer: Self-pay | Admitting: Family Medicine

## 2024-03-19 ENCOUNTER — Ambulatory Visit

## 2024-03-19 VITALS — BP 141/87 | Ht 64.0 in | Wt 142.0 lb

## 2024-03-19 DIAGNOSIS — Z0000 Encounter for general adult medical examination without abnormal findings: Secondary | ICD-10-CM

## 2024-03-19 NOTE — Patient Instructions (Signed)
 Rebecca [name],  Thank you for taking the time for your Medicare Wellness Visit. I appreciate your continued commitment to your health goals. Please review the care plan we discussed, and feel free to reach out if I can assist you further.  Please note that Annual Wellness Visits do not include a physical exam. Some assessments may be limited, especially if the visit was conducted virtually. If needed, we may recommend an in-person follow-up with your provider.  Ongoing Care Seeing your primary care provider every 3 to 6 months helps us  monitor your health and provide consistent, personalized care.   Referrals If a referral was made during today's visit and you haven't received any updates within two weeks, please contact the referred provider directly to check on the status.  Recommended Screenings:  Health Maintenance  Topic Date Due   Medicare Annual Wellness Visit  Never done   Pneumococcal Vaccine for age over 72 (1 of 2 - PCV) Never done   Colon Cancer Screening  Never done   Zoster (Shingles) Vaccine (1 of 2) Never done   Complete foot exam   11/14/2020   DEXA scan (bone density measurement)  Never done   COVID-19 Vaccine (4 - 2025-26 season) 01/01/2024   Flu Shot  07/30/2024*   Hemoglobin A1C  03/20/2024   Eye exam for diabetics  08/13/2024   Yearly kidney health urinalysis for diabetes  09/17/2024   Yearly kidney function blood test for diabetes  12/25/2024   Breast Cancer Screening  03/13/2026   DTaP/Tdap/Td vaccine (2 - Td or Tdap) 12/01/2029   Hepatitis C Screening  Completed   Meningitis B Vaccine  Aged Out  *Topic was postponed. The date shown is not the original due date.       03/19/2024   11:39 AM  Advanced Directives  Does Patient Have a Medical Advance Directive? No  Would patient like information on creating a medical advance directive? No - Patient declined    Vision: Annual vision screenings are recommended for early detection of glaucoma, cataracts, and  diabetic retinopathy. These exams can also reveal signs of chronic conditions such as diabetes and high blood pressure.  Dental: Annual dental screenings help detect early signs of oral cancer, gum disease, and other conditions linked to overall health, including heart disease and diabetes.  Please see the attached documents for additional preventive care recommendations.

## 2024-03-19 NOTE — Progress Notes (Signed)
 I connected with  Rebecca [name] on 03/19/24 by a video and audio enabled telemedicine application and verified that I am speaking with the correct person using two identifiers.  Patient Location: Home  Provider Location: Home Office  Persons Participating in Visit: Patient.  I discussed the limitations of evaluation and management by telemedicine. The patient expressed understanding and agreed to proceed.  Vital Signs: Because this visit was a virtual/telehealth visit, some criteria may be missing or patient reported. Any vitals not documented were not able to be obtained and vitals that have been documented are patient reported.  Because this visit was a virtual/telehealth visit,  certain criteria was not obtained, such a blood pressure, CBG if applicable, and timed get up and go. Any medications not marked as taking were not mentioned during the medication reconciliation part of the visit. Any vitals not documented were not able to be obtained due to this being a telehealth visit or patient was unable to self-report a recent blood pressure reading due to a lack of equipment at home via telehealth. Vitals that have been documented are verbally provided by the patient.   This visit was performed by a medical professional under my direct supervision. I was immediately available for consultation/collaboration. I have reviewed and agree with the Annual Wellness Visit documentation.  Chief Complaint  Patient presents with   Medicare Wellness     Subjective:   Rebecca [name] is a 68 y.o. female who presents for a Medicare Annual Wellness Visit.  Allergies (verified) Penicillins   History: Past Medical History:  Diagnosis Date   Allergy 1963   Anxiety    CAD (coronary artery disease)    Depression    Diabetes mellitus without complication (HCC)    DM (diabetes mellitus), type 2, uncontrolled with complications 04/06/2012   Hypertension    Hypertensive emergency 11/14/2012    Hypokalemia 11/14/2012   Hyponatremia 04/08/2012   Mature cataract 04/07/2023   Mixed hyperlipidemia 04/06/2012   NSTEMI (non-ST elevated myocardial infarction) (HCC) 11/14/2012   Tobacco abuse 04/06/2012   Past Surgical History:  Procedure Laterality Date   ABDOMINAL AORTOGRAM N/A 11/17/2023   Procedure: ABDOMINAL AORTOGRAM;  Surgeon: Magda Debby SAILOR, MD;  Location: [facility];  Service: Cardiovascular;  Laterality: N/A;   ABDOMINAL AORTOGRAM W/LOWER EXTREMITY N/A 06/17/2020   Procedure: ABDOMINAL AORTOGRAM W/LOWER EXTREMITY;  Surgeon: Magda Debby SAILOR, MD;  Location: [facility];  Service: Cardiovascular;  Laterality: N/A;   FEMORAL-POPLITEAL BYPASS GRAFT Left 09/20/2018   Procedure: Left  FEMORAL-BELOW KNEE POPLITEAL ARTERY Bypass with Translocated nonreversed saphenous vein;  Surgeon: Oris Krystal FALCON, MD;  Location: [facility] OR;  Service: Vascular;  Laterality: Left;   FRACTURE SURGERY  1967   LEFT HEART CATHETERIZATION WITH CORONARY ANGIOGRAM N/A 11/14/2012   Procedure: LEFT HEART CATHETERIZATION WITH CORONARY ANGIOGRAM;  Surgeon: Erick JONELLE Bergamo, MD;  Location: [facility] CATH LAB;  Service: Cardiovascular;  Laterality: N/A;   LOWER EXTREMITY ANGIOGRAPHY N/A 09/05/2018   Procedure: LOWER EXTREMITY ANGIOGRAPHY;  Surgeon: Serene Gaile ORN, MD;  Location: [facility];  Service: Cardiovascular;  Laterality: N/A;   LOWER EXTREMITY ANGIOGRAPHY Right 11/17/2023   Procedure: Lower Extremity Angiography;  Surgeon: Magda Debby SAILOR, MD;  [facility] INVASIVE [facility];  Service: Cardiovascular;  Laterality: Right;   LOWER EXTREMITY INTERVENTION Right 11/17/2023   Procedure: LOWER EXTREMITY INTERVENTION;  Surgeon: Magda Debby SAILOR, MD;  Location: [facility];  Service: Cardiovascular;  Laterality: Right;   PERCUTANEOUS CORONARY  STENT INTERVENTION (PCI-S)  11/14/2012   Procedure: PERCUTANEOUS CORONARY STENT INTERVENTION (PCI-S);  Surgeon: Erick JONELLE Bergamo, MD;  Location: [facility] CATH LAB;   Service: Cardiovascular;;   PERIPHERAL VASCULAR INTERVENTION Right 06/17/2020   Procedure: PERIPHERAL VASCULAR INTERVENTION;  Surgeon: Magda Debby SAILOR, MD;  Location: [facility];  Service: Cardiovascular;  Laterality: Right;  Femoral/Popliteal    SPINE SURGERY     TONSILLECTOMY     Family History  Problem Relation Age of Onset   Diabetes Brother    Heart disease Brother    Arthritis Brother    Stroke Mother    Diabetes Father    Heart disease Father    Social History   Occupational History   Not on file  Tobacco Use   Smoking status: Former    Current packs/day: 0.00    Types: Cigarettes    Quit date: 05/22/2017    Years since quitting: 6.8    Passive exposure: Never   Smokeless tobacco: Never  Vaping Use   Vaping status: Never Used  Substance and Sexual Activity   Alcohol use: Yes    Comment: occ   Drug use: No   Sexual activity: Not Currently    Birth control/protection: None   Tobacco Counseling Counseling given: Not Answered  SDOH Screenings   Food Insecurity: No Food Insecurity (03/19/2024)  Housing: Low Risk  (03/19/2024)  Transportation Needs: No Transportation Needs (03/19/2024)  Utilities: Not At Risk (03/19/2024)  Alcohol Screen: Low Risk  (01/18/2023)  Depression (PHQ2-9): Low Risk  (03/19/2024)  Financial Resource Strain: Medium Risk (01/18/2023)  Physical Activity: Sufficiently Active (03/19/2024)  Social Connections: Moderately Isolated (03/19/2024)  Stress: Stress Concern Present (03/19/2024)  Tobacco Use: Medium Risk (03/19/2024)  Health Literacy: Adequate Health Literacy (03/19/2024)   See flowsheets for full screening details  Depression Screen PHQ 2 & 9 Depression Scale- Over the past 2 weeks, how often have you been bothered by any of the following problems? Little interest or pleasure in doing things: 0 Feeling down, depressed, or hopeless (PHQ Adolescent also includes...irritable): 0 PHQ-2 Total Score: 0 Trouble falling or  staying asleep, or sleeping too much: 0 Feeling tired or having little energy: 0 Poor appetite or overeating (PHQ Adolescent also includes...weight loss): 0 Feeling bad about yourself - or that you are a failure or have let yourself or your family down: 0 Trouble concentrating on things, such as reading the newspaper or watching television (PHQ Adolescent also includes...like school work): 0 Moving or speaking so slowly that other people could have noticed. Or the opposite - being so fidgety or restless that you have been moving around a lot more than usual: 0 Thoughts that you would be better off dead, or of hurting yourself in some way: 0 PHQ-9 Total Score: 0 If you checked off any problems, how difficult have these problems made it for you to do your work, take care of things at home, or get along with other people?: Not difficult at all  Depression Treatment Depression Interventions/Treatment : EYV7-0 Score <4 Follow-up Not Indicated     Goals Addressed             This Visit's Progress    Patient Stated       Patient declined        Visit info / Clinical Intake: Medicare Wellness Visit Type:: Initial Annual Wellness Visit Persons participating in visit:: patient Medicare Wellness Visit Mode:: Video Because this visit was a virtual/telehealth visit:: pt reported vitals If Telephone  or Video please confirm:: I connected with the patient using audio enabled telemedicine application and verified that I am speaking with the correct person using two identifiers; I discussed the limitations of evaluation and management by telemedicine; The patient expressed understanding and agreed to proceed Patient Location:: home Provider Location:: home Information given by:: patient Interpreter Needed?: No Pre-visit prep was completed: yes AWV questionnaire completed by patient prior to visit?: no Living arrangements:: (!) lives alone Patient's Overall Health Status Rating: very  good Typical amount of pain: none Does pain affect daily life?: no Are you currently prescribed opioids?: no  Dietary Habits and Nutritional Risks How many meals a day?: 2 Eats fruit and vegetables daily?: yes Most meals are obtained by: preparing own meals In the last 2 weeks, have you had any of the following?: none Diabetic:: no  Functional Status Activities of Daily Living (to include ambulation/medication): Independent Ambulation: Independent with device- listed below Home Assistive Devices/Equipment: Wheelchair Medication Administration: Independent Home Management: Independent Manage your own finances?: (!) no Primary transportation is: driving Concerns about vision?: no *vision screening is required for WTM* Concerns about hearing?: no  Fall Screening Falls in the past year?: 1 Number of falls in past year: 0 Was there an injury with Fall?: 1 Fall Risk Category Calculator: 2 Patient Fall Risk Level: Moderate Fall Risk  Fall Risk Patient at Risk for Falls Due to: History of fall(s); Impaired balance/gait; Impaired mobility Fall risk Follow up: Falls evaluation completed; Education provided  Home and Transportation Safety: All rugs have non-skid backing?: N/A, no rugs All stairs or steps have railings?: N/A, no stairs Have non-skid surface in bathtub or shower?: yes Good home lighting?: yes Regular seat belt use?: yes Hospital stays in the last year:: no  Cognitive Assessment Difficulty concentrating, remembering, or making decisions? : no Will 6CIT or Mini Cog be Completed: no 6CIT or Mini Cog Declined: patient alert, oriented, able to answer questions appropriately and recall recent events  Advance Directives (For Healthcare) Does Patient Have a Medical Advance Directive?: No Would patient like information on creating a medical advance directive?: No - Patient declined  Reviewed/Updated  Reviewed/Updated: Reviewed All (Medical, Surgical, Family,  Medications, Allergies, Care Teams, Patient Goals)        Objective:    Today's Vitals   03/19/24 1137  BP: (!) 141/87  Weight: 142 lb (64.4 kg)  Height: 5' 4 (1.626 m)   Body mass index is 24.37 kg/m.  Current Medications (verified) Outpatient Encounter Medications as of 03/19/2024  Medication Sig   amLODipine  (NORVASC ) 10 MG tablet Take 1 tablet (10 mg total) by mouth daily.   aspirin  EC 81 MG tablet Take 81 mg by mouth in the morning.   clopidogrel  (PLAVIX ) 75 MG tablet Take 1 tablet (75 mg total) by mouth daily.   colchicine 0.6 MG tablet Take 1 tablet (0.6 mg total) by mouth daily.   Continuous Glucose Receiver (FREESTYLE LIBRE 3 READER) DEVI USE AS DIRECTED TO MONITOR BLOOD SUGAR   Continuous Glucose Sensor (FREESTYLE LIBRE 3 PLUS SENSOR) MISC APPLY TO THE SKIN EVERY 15 DAYS AS DIRECTED TO MONITOR BLOOD GLUCOSE CONTINUOUSLY   dapagliflozin  propanediol (FARXIGA ) 5 MG TABS tablet Take 1 tablet (5 mg total) by mouth daily before breakfast.   hydrochlorothiazide  (HYDRODIURIL ) 25 MG tablet Take 1 tablet (25 mg total) by mouth daily.   KRILL OIL PO Take 1 capsule by mouth daily with supper.   lisinopril  (ZESTRIL ) 40 MG tablet Take 1 tablet (40 mg total)  by mouth daily.   metoprolol  tartrate (LOPRESSOR ) 25 MG tablet Take 1 tablet (25 mg total) by mouth 2 (two) times daily.   rosuvastatin  (CRESTOR ) 40 MG tablet Take 1 tablet (40 mg total) by mouth daily.   No facility-administered encounter medications on file as of 03/19/2024.   Hearing/Vision screen Hearing Screening - Comments:: No difficulties Vision Screening - Comments:: Patient wears readers  Immunizations and Health Maintenance Health Maintenance  Topic Date Due   Medicare Annual Wellness (AWV)  Never done   Pneumococcal Vaccine: 50+ Years (1 of 2 - PCV) Never done   Colonoscopy  Never done   Zoster Vaccines- Shingrix (1 of 2) Never done   FOOT EXAM  11/14/2020   DEXA SCAN  Never done   COVID-19 Vaccine (4 -  2025-26 season) 01/01/2024   Influenza Vaccine  07/30/2024 (Originally 12/01/2023)   HEMOGLOBIN A1C  03/20/2024   OPHTHALMOLOGY EXAM  08/13/2024   Diabetic kidney evaluation - Urine ACR  09/17/2024   Diabetic kidney evaluation - eGFR measurement  12/25/2024   Mammogram  03/13/2026   DTaP/Tdap/Td (2 - Td or Tdap) 12/01/2029   Hepatitis C Screening  Completed   Meningococcal B Vaccine  Aged Out        Assessment/Plan:  This is a routine wellness examination for Lavra.  Patient Care Team: Towana Small, FNP as PCP - General (Family Medicine)  I have personally reviewed and noted the following in the patient's chart:   Medical and social history Use of alcohol, tobacco or illicit drugs  Current medications and supplements including opioid prescriptions. Functional ability and status Nutritional status Physical activity Advanced directives List of other physicians Hospitalizations, surgeries, and ER visits in previous 12 months Vitals Screenings to include cognitive, depression, and falls Referrals and appointments  No orders of the defined types were placed in this encounter.  In addition, I have reviewed and discussed with patient certain preventive protocols, quality metrics, and best practice recommendations. A written personalized care plan for preventive services as well as general preventive health recommendations were provided to patient.   Lyle MARLA Right, [place]   03/19/2024   No follow-ups on file.  After Visit Summary: (MyChart) Due to this being a telephonic visit, the after visit summary with patients personalized plan was offered to patient via MyChart   Nurse Notes: nothing to report

## 2024-03-25 ENCOUNTER — Ambulatory Visit: Admitting: Podiatry

## 2024-03-25 DIAGNOSIS — B351 Tinea unguium: Secondary | ICD-10-CM

## 2024-03-25 DIAGNOSIS — S9032XD Contusion of left foot, subsequent encounter: Secondary | ICD-10-CM | POA: Diagnosis not present

## 2024-03-25 DIAGNOSIS — M10071 Idiopathic gout, right ankle and foot: Secondary | ICD-10-CM

## 2024-03-27 NOTE — Progress Notes (Signed)
 Subjective: Chief Complaint  Patient presents with   DFc    Rm12 diabetic foot care/ A1C 6.8 Patient says she is doing well.      68 year old female presents the office today with above concerns.  Overall she said that she is doing well.  She denied any significant pain to her feet she is wearing regular shoes and doing normal activities.  She has no pain to either foot today.  No recent injuries or changes.   Objective: AAO x3, NAD DP/PT pulses palpable bilaterally, CRT less than 3 seconds Nails are mildly hypertrophic, dystrophic and elongated.  No pain in the nails. No open lesions. There is no area of pinpoint tenderness identified at this time.  Flexor, extensor tendons intact.  MMT 5/5. No pain with calf compression, swelling, warmth, erythema  Assessment: Improved symptoms bilateral lower feet status post avulsion fracture left foot, likely gout right foot  Plan: Left foot injury - There is no pain or swelling today.  She is back to her regular shoe gear.  Activity as tolerated.  She symptoms worsen, recur to repeat x-rays or advanced imaging.  Concern for right foot gout  - Currently no pain or swelling today.  Continue to monitor for reoccurrence.  Onychomycosis - As a courtesy debride the nails and complications of bleeding.   Rebecca [name] DPM

## 2024-04-21 ENCOUNTER — Ambulatory Visit

## 2024-04-22 ENCOUNTER — Ambulatory Visit
Admission: RE | Admit: 2024-04-22 | Discharge: 2024-04-22 | Disposition: A | Discharge status: Home / Self Care | Attending: Student | Admitting: Student

## 2024-04-22 VITALS — BP 125/74 | HR 78 | Temp 97.7°F | Resp 18

## 2024-04-22 DIAGNOSIS — J0100 Acute maxillary sinusitis, unspecified: Secondary | ICD-10-CM | POA: Diagnosis not present

## 2024-04-22 MED ORDER — AZITHROMYCIN 250 MG PO TABS: 250.0000 mg | ORAL_TABLET | Freq: Every day | ORAL | 0 refills | Status: AC

## 2024-04-22 NOTE — Discharge Instructions (Addendum)
-  For your sinus infection, start the z-pack antibiotic for five days  -Continue OTC nasal spray if it is helping -Your cough should slowly get better instead of worse. If you develop a cough productive of dark or red sputum, new shortness of breath, new chest tightness, new fevers, etc - seek additional care.

## 2024-04-22 NOTE — ED Triage Notes (Signed)
 Runny nose, cough started 12/8, worsened, easing but not going away - Entered by patient  States she is feeling better, no fever, but is concerned because she still has productive cough and is having sinus drainage with odor

## 2024-04-22 NOTE — ED Provider Notes (Signed)
 " [facility]    CSN: [id number] Arrival date & time: 04/22/24  1054      History   Chief Complaint Chief Complaint  Patient presents with   Cough    HPI ALEENA KIRKEBY is a 68 y.o. female presenting w cough and sinusitis.  Symptoms started on 12/8 with cough and nasal congestion.  The cough has been persistent, and now is productive of yellow sputum. Denies SOB. Now also with some sinus drainage and odor. States the nasal congestion is improving, but now with foul odor. Significant facial pain, which is worse when she blows her nose. Attempted OTC nasal spray.  Former smoker. No current inhalers.    HPI  Past Medical History:  Diagnosis Date   Allergy 1963   Anxiety    CAD (coronary artery disease)    Depression    Diabetes mellitus without complication (HCC)    DM (diabetes mellitus), type 2, uncontrolled with complications 04/06/2012   Hypertension    Hypertensive emergency 11/14/2012   Hypokalemia 11/14/2012   Hyponatremia 04/08/2012   Mature cataract 04/07/2023   Mixed hyperlipidemia 04/06/2012   NSTEMI (non-ST elevated myocardial infarction) (HCC) 11/14/2012   Tobacco abuse 04/06/2012    Patient Active Problem List   Diagnosis Date Noted   Stage 3b chronic kidney disease (HCC) 09/22/2023   Hypertriglyceridemia 09/22/2023   Combined forms of age-related cataract of both eyes 04/07/2023   Mature cataract 04/07/2023   Essential hypertension 01/18/2023   Closed nondisplaced fracture of middle phalanx of finger of right hand 01/18/2023   Hyperlipidemia associated with type 2 diabetes mellitus (HCC) 01/18/2023   PAD (peripheral artery disease) 09/05/2018   NSTEMI (non-ST elevated myocardial infarction) (HCC) 11/14/2012   DM (diabetes mellitus), type 2 with complications (HCC) 04/06/2012   Tobacco abuse 04/06/2012    Past Surgical History:  Procedure Laterality Date   ABDOMINAL AORTOGRAM N/A 11/17/2023   Procedure: ABDOMINAL AORTOGRAM;  Surgeon:  Magda Debby SAILOR, MD;  Location: [facility];  Service: Cardiovascular;  Laterality: N/A;   ABDOMINAL AORTOGRAM W/LOWER EXTREMITY N/A 06/17/2020   Procedure: ABDOMINAL AORTOGRAM W/LOWER EXTREMITY;  Surgeon: Magda Debby SAILOR, MD;  Location: [facility];  Service: Cardiovascular;  Laterality: N/A;   FEMORAL-POPLITEAL BYPASS GRAFT Left 09/20/2018   Procedure: Left  FEMORAL-BELOW KNEE POPLITEAL ARTERY Bypass with Translocated nonreversed saphenous vein;  Surgeon: Oris Krystal FALCON, MD;  Location: [facility] OR;  Service: Vascular;  Laterality: Left;   FRACTURE SURGERY  1967   LEFT HEART CATHETERIZATION WITH CORONARY ANGIOGRAM N/A 11/14/2012   Procedure: LEFT HEART CATHETERIZATION WITH CORONARY ANGIOGRAM;  Surgeon: Erick JONELLE Bergamo, MD;  Location: [facility] CATH LAB;  Service: Cardiovascular;  Laterality: N/A;   LOWER EXTREMITY ANGIOGRAPHY N/A 09/05/2018   Procedure: LOWER EXTREMITY ANGIOGRAPHY;  Surgeon: Serene Gaile ORN, MD;  Location: [facility];  Service: Cardiovascular;  Laterality: N/A;   LOWER EXTREMITY ANGIOGRAPHY Right 11/17/2023   Procedure: Lower Extremity Angiography;  Surgeon: Magda Debby SAILOR, MD;  Location: [facility] INVASIVE CV LAB;  Service: Cardiovascular;  Laterality: Right;   LOWER EXTREMITY INTERVENTION Right 11/17/2023   Procedure: LOWER EXTREMITY INTERVENTION;  Surgeon: Magda Debby SAILOR, MD;  Location: [facility];  Service: Cardiovascular;  Laterality: Right;   PERCUTANEOUS CORONARY STENT INTERVENTION (PCI-S)  11/14/2012   Procedure: PERCUTANEOUS CORONARY STENT INTERVENTION (PCI-S);  Surgeon: Erick JONELLE Bergamo, MD;  Location: [facility] CATH LAB;  Service: Cardiovascular;;   PERIPHERAL VASCULAR INTERVENTION Right 06/17/2020   Procedure: PERIPHERAL VASCULAR INTERVENTION;  Surgeon: Magda Debby SAILOR, MD;  Location: [facility] INVASIVE CV LAB;  Service: Cardiovascular;  Laterality: Right;  Femoral/Popliteal    SPINE SURGERY     TONSILLECTOMY      OB History   No obstetric history on file.       Home Medications    Prior to Admission medications  Medication Sig Start Date End Date Taking? Authorizing Provider  amLODipine  (NORVASC ) 10 MG tablet Take 1 tablet (10 mg total) by mouth daily. 06/21/23  Yes Towana Small, FNP  aspirin  EC 81 MG tablet Take 81 mg by mouth in the morning.   Yes [provider]  azithromycin  (ZITHROMAX  Z-PAK) 250 MG tablet Take 1 tablet (250 mg total) by mouth daily. Two pills (500mg ) day 1. One pill per day (250mg ) days 2-5. 04/22/24  Yes Leiland Mihelich E, PA-C  clopidogrel  (PLAVIX ) 75 MG tablet Take 1 tablet (75 mg total) by mouth daily. 11/17/23 11/16/24 Yes Magda Debby SAILOR, MD  dapagliflozin  propanediol (FARXIGA ) 5 MG TABS tablet Take 1 tablet (5 mg total) by mouth daily before breakfast. 01/20/23  Yes Towana Small, FNP  hydrochlorothiazide  (HYDRODIURIL ) 25 MG tablet Take 1 tablet (25 mg total) by mouth daily. 06/21/23  Yes Towana Small, FNP  KRILL OIL PO Take 1 capsule by mouth daily with supper.   Yes [provider]  lisinopril  (ZESTRIL ) 40 MG tablet Take 1 tablet (40 mg total) by mouth daily. 01/02/24  Yes Towana Small, FNP  metoprolol  tartrate (LOPRESSOR ) 25 MG tablet Take 1 tablet (25 mg total) by mouth 2 (two) times daily. 01/18/23 04/22/24 Yes Towana Small, FNP  rosuvastatin  (CRESTOR ) 40 MG tablet Take 1 tablet (40 mg total) by mouth daily. 06/21/23  Yes Towana Small, FNP  Continuous Glucose Receiver (FREESTYLE LIBRE 3 READER) DEVI USE AS DIRECTED TO MONITOR BLOOD SUGAR 04/21/23   [provider]  Continuous Glucose Sensor (FREESTYLE LIBRE 3 PLUS SENSOR) MISC APPLY TO THE SKIN EVERY 15 DAYS AS DIRECTED TO MONITOR BLOOD GLUCOSE CONTINUOUSLY 04/10/23   [provider]    Family History Family History  Problem Relation Age of Onset   Diabetes Brother    Heart disease Brother    Arthritis Brother    Stroke Mother    Diabetes Father    Heart disease Father     Social History Social  History[1]   Allergies   Penicillins   Review of Systems Review of Systems  Constitutional:  Negative for appetite change, chills and fever.  HENT:  Positive for congestion. Negative for ear pain, rhinorrhea, sinus pressure, sinus pain and sore throat.   Eyes:  Negative for redness and visual disturbance.  Respiratory:  Positive for cough. Negative for chest tightness, shortness of breath and wheezing.   Cardiovascular:  Negative for chest pain and palpitations.  Gastrointestinal:  Negative for abdominal pain, constipation, diarrhea, nausea and vomiting.  Genitourinary:  Negative for dysuria, frequency and urgency.  Musculoskeletal:  Negative for myalgias.  Neurological:  Negative for dizziness, weakness and headaches.  Psychiatric/Behavioral:  Negative for confusion.   All other systems reviewed and are negative.    Physical Exam Triage Vital Signs ED Triage Vitals  Encounter Vitals Group     BP 04/22/24 1125 125/74     Girls Systolic BP Percentile --      Girls Diastolic BP Percentile --      Boys Systolic BP Percentile --      Boys Diastolic BP Percentile --      Pulse Rate 04/22/24  1125 78     Resp 04/22/24 1125 18     Temp 04/22/24 1125 97.7 F (36.5 C)     Temp Source 04/22/24 1125 Oral     SpO2 04/22/24 1125 96 %     Weight --      Height --      Head Circumference --      Peak Flow --      Pain Score 04/22/24 1123 0     Pain Loc --      Pain Education --      Exclude from Growth Chart --    No data found.  Updated Vital Signs BP 125/74 (BP Location: Left Arm)   Pulse 78   Temp 97.7 F (36.5 C) (Oral)   Resp 18   SpO2 96%   Visual Acuity Right Eye Distance:   Left Eye Distance:   Bilateral Distance:    Right Eye Near:   Left Eye Near:    Bilateral Near:     Physical Exam Vitals reviewed.  Constitutional:      General: She is not in acute distress.    Appearance: Normal appearance. She is not ill-appearing.  HENT:     Head: Normocephalic  and atraumatic.     Right Ear: Tympanic membrane, ear canal and external ear normal. No tenderness. No middle ear effusion. There is no impacted cerumen. Tympanic membrane is not perforated, erythematous, retracted or bulging.     Left Ear: Tympanic membrane, ear canal and external ear normal. No tenderness.  No middle ear effusion. There is no impacted cerumen. Tympanic membrane is not perforated, erythematous, retracted or bulging.     Nose: No congestion.     Right Sinus: Maxillary sinus tenderness present.     Left Sinus: Maxillary sinus tenderness present.     Mouth/Throat:     Mouth: Mucous membranes are moist.     Pharynx: Uvula midline. No oropharyngeal exudate or posterior oropharyngeal erythema.     Tonsils: No tonsillar exudate.     Comments: Tonsils are surgically absent Posterior oropharyngeal cobblestoning Eyes:     Extraocular Movements: Extraocular movements intact.     Pupils: Pupils are equal, round, and reactive to light.  Cardiovascular:     Rate and Rhythm: Normal rate and regular rhythm.     Heart sounds: Normal heart sounds.  Pulmonary:     Effort: Pulmonary effort is normal.     Breath sounds: Normal breath sounds. No decreased breath sounds, wheezing, rhonchi or rales.  Abdominal:     Palpations: Abdomen is soft.     Tenderness: There is no abdominal tenderness. There is no guarding or rebound.  Lymphadenopathy:     Cervical: No cervical adenopathy.     Right cervical: No superficial, deep or posterior cervical adenopathy.    Left cervical: No superficial, deep or posterior cervical adenopathy.  Skin:    Comments: No rash   Neurological:     General: No focal deficit present.     Mental Status: She is alert and oriented to person, place, and time.  Psychiatric:        Mood and Affect: Mood normal.        Behavior: Behavior normal.        Thought Content: Thought content normal.        Judgment: Judgment normal.      UC Treatments / Results   Labs (all labs ordered are listed, but only abnormal results are displayed) Labs Reviewed -  No data to display  EKG   Radiology No results found.  Procedures Procedures (including critical care time)  Medications Ordered in UC Medications - No data to display  Initial Impression / Assessment and Plan / UC Course  I have reviewed the triage vital signs and the nursing notes.  Pertinent labs & imaging results that were available during my care of the patient were reviewed by me and considered in my medical decision making (see chart for details).     Patient is a pleasant 68 y.o. female presenting with sinusitis following viral syndrome. The patient is afebrile and nontachycardic.  Antipyretic has not been administered today.  Penicillin allergic. Z-pack sent. Continue OTC nasal spray if it is helping. Return precautions as below.   Final Clinical Impressions(s) / UC Diagnoses   Final diagnoses:  Acute non-recurrent maxillary sinusitis     Discharge Instructions      -For your sinus infection, start the z-pack antibiotic for five days  -Continue OTC nasal spray if it is helping -Your cough should slowly get better instead of worse. If you develop a cough productive of dark or red sputum, new shortness of breath, new chest tightness, new fevers, etc - seek additional care.     ED Prescriptions     Medication Sig Dispense Auth. Provider   azithromycin  (ZITHROMAX  Z-PAK) 250 MG tablet Take 1 tablet (250 mg total) by mouth daily. Two pills (500mg ) day 1. One pill per day (250mg ) days 2-5. 6 tablet Claiborne Stroble E, PA-C      PDMP not reviewed this encounter.     [1]  Social History Tobacco Use   Smoking status: Former    Current packs/day: 0.00    Average packs/day: 1.0 packs/day    Types: Cigarettes    Quit date: 05/22/2017    Years since quitting: 6.9    Passive exposure: Never   Smokeless tobacco: Never  Vaping Use   Vaping status: Never Used  Substance  Use Topics   Alcohol use: Yes    Comment: occ   Drug use: No     Arlyss Leita BRAVO, PA-C 04/22/24 1154  "

## 2024-09-24 ENCOUNTER — Ambulatory Visit: Admitting: Podiatry
